# Patient Record
Sex: Male | Born: 1965 | Race: White | Hispanic: No | Marital: Married | Smoking: Current every day smoker
Health system: Southern US, Community
[De-identification: ages and names within clinical notes are randomized; demographics above are authoritative.]

## PROBLEM LIST (undated history)

## (undated) DIAGNOSIS — I509 Heart failure, unspecified: Secondary | ICD-10-CM

## (undated) DIAGNOSIS — Z87891 Personal history of nicotine dependence: Secondary | ICD-10-CM

## (undated) DIAGNOSIS — E785 Hyperlipidemia, unspecified: Secondary | ICD-10-CM

## (undated) DIAGNOSIS — F419 Anxiety disorder, unspecified: Secondary | ICD-10-CM

## (undated) DIAGNOSIS — I10 Essential (primary) hypertension: Secondary | ICD-10-CM

## (undated) DIAGNOSIS — F1910 Other psychoactive substance abuse, uncomplicated: Secondary | ICD-10-CM

## (undated) DIAGNOSIS — I2119 ST elevation (STEMI) myocardial infarction involving other coronary artery of inferior wall: Secondary | ICD-10-CM

## (undated) DIAGNOSIS — J45909 Unspecified asthma, uncomplicated: Secondary | ICD-10-CM

## (undated) DIAGNOSIS — I2510 Atherosclerotic heart disease of native coronary artery without angina pectoris: Secondary | ICD-10-CM

## (undated) DIAGNOSIS — I5020 Unspecified systolic (congestive) heart failure: Secondary | ICD-10-CM

## (undated) HISTORY — PX: CARDIAC CATHETERIZATION: SHX172

## (undated) HISTORY — PX: SHOULDER SURGERY: SHX246

---

## 2004-06-27 ENCOUNTER — Emergency Department: Payer: Self-pay | Admitting: Unknown Physician Specialty

## 2005-02-18 ENCOUNTER — Ambulatory Visit: Payer: Self-pay | Admitting: Unknown Physician Specialty

## 2005-03-31 ENCOUNTER — Ambulatory Visit: Payer: Self-pay | Admitting: Pain Medicine

## 2005-04-30 ENCOUNTER — Ambulatory Visit: Payer: Self-pay | Admitting: Pain Medicine

## 2005-07-07 ENCOUNTER — Ambulatory Visit: Payer: Self-pay | Admitting: Psychiatry

## 2006-09-28 ENCOUNTER — Ambulatory Visit: Payer: Self-pay | Admitting: Emergency Medicine

## 2006-10-19 ENCOUNTER — Ambulatory Visit: Payer: Self-pay | Admitting: Emergency Medicine

## 2007-03-16 ENCOUNTER — Ambulatory Visit: Payer: Self-pay | Admitting: Emergency Medicine

## 2007-07-08 ENCOUNTER — Ambulatory Visit: Payer: Self-pay | Admitting: Emergency Medicine

## 2007-07-08 ENCOUNTER — Ambulatory Visit: Payer: Self-pay | Admitting: Family Medicine

## 2007-09-12 ENCOUNTER — Ambulatory Visit: Payer: Self-pay | Admitting: Internal Medicine

## 2008-02-01 ENCOUNTER — Ambulatory Visit: Payer: Self-pay | Admitting: Family Medicine

## 2008-05-07 ENCOUNTER — Emergency Department (HOSPITAL_BASED_OUTPATIENT_CLINIC_OR_DEPARTMENT_OTHER): Admission: EM | Admit: 2008-05-07 | Discharge: 2008-05-07 | Payer: Self-pay | Admitting: Emergency Medicine

## 2008-05-07 ENCOUNTER — Ambulatory Visit: Payer: Self-pay | Admitting: Diagnostic Radiology

## 2009-07-03 ENCOUNTER — Ambulatory Visit: Payer: Self-pay | Admitting: Family Medicine

## 2009-09-04 ENCOUNTER — Emergency Department: Payer: Self-pay | Admitting: Emergency Medicine

## 2010-03-03 ENCOUNTER — Ambulatory Visit: Payer: Self-pay | Admitting: Internal Medicine

## 2010-03-05 ENCOUNTER — Emergency Department: Payer: Self-pay | Admitting: Unknown Physician Specialty

## 2010-06-08 ENCOUNTER — Ambulatory Visit: Payer: Self-pay | Admitting: Internal Medicine

## 2010-06-12 ENCOUNTER — Ambulatory Visit: Payer: Self-pay | Admitting: Internal Medicine

## 2010-10-26 ENCOUNTER — Ambulatory Visit: Payer: Self-pay | Admitting: Family Medicine

## 2010-10-29 ENCOUNTER — Emergency Department: Payer: Self-pay | Admitting: Emergency Medicine

## 2011-08-03 ENCOUNTER — Ambulatory Visit: Payer: Self-pay

## 2011-11-01 ENCOUNTER — Emergency Department: Payer: Self-pay | Admitting: Emergency Medicine

## 2011-12-14 ENCOUNTER — Ambulatory Visit: Payer: Self-pay | Admitting: Internal Medicine

## 2012-02-02 ENCOUNTER — Ambulatory Visit: Payer: Self-pay | Admitting: Internal Medicine

## 2012-04-05 ENCOUNTER — Ambulatory Visit: Payer: Self-pay | Admitting: Internal Medicine

## 2012-04-22 ENCOUNTER — Ambulatory Visit: Payer: Self-pay

## 2015-07-11 ENCOUNTER — Encounter: Payer: Self-pay | Admitting: Medical Oncology

## 2015-07-11 ENCOUNTER — Emergency Department
Admission: EM | Admit: 2015-07-11 | Discharge: 2015-07-11 | Disposition: A | Discharge status: Home / Self Care | Payer: Self-pay | Attending: Emergency Medicine | Admitting: Emergency Medicine

## 2015-07-11 DIAGNOSIS — B9689 Other specified bacterial agents as the cause of diseases classified elsewhere: Secondary | ICD-10-CM

## 2015-07-11 DIAGNOSIS — F17200 Nicotine dependence, unspecified, uncomplicated: Secondary | ICD-10-CM | POA: Insufficient documentation

## 2015-07-11 DIAGNOSIS — J019 Acute sinusitis, unspecified: Secondary | ICD-10-CM | POA: Insufficient documentation

## 2015-07-11 MED ORDER — FLUTICASONE PROPIONATE 50 MCG/ACT NA SUSP: 2.0000 | Freq: Every day | NASAL | Status: DC

## 2015-07-11 MED ORDER — AMOXICILLIN 875 MG PO TABS: 875.0000 mg | ORAL_TABLET | Freq: Two times a day (BID) | ORAL | Status: DC

## 2015-07-11 MED ORDER — PSEUDOEPH-BROMPHEN-DM 30-2-10 MG/5ML PO SYRP: 10.0000 mL | ORAL_SOLUTION | Freq: Four times a day (QID) | ORAL | Status: DC | PRN

## 2015-07-11 MED ORDER — ALBUTEROL SULFATE HFA 108 (90 BASE) MCG/ACT IN AERS: 1.0000 | INHALATION_SPRAY | Freq: Four times a day (QID) | RESPIRATORY_TRACT | Status: DC | PRN

## 2015-07-11 NOTE — ED Provider Notes (Signed)
[facility] Emergency Department Provider Note  ____________________________________________  Time seen: Approximately 3:53 PM  I have reviewed the triage vital signs and the nursing notes.   HISTORY  Chief Complaint Facial Pain and Nasal Congestion    HPI Matthew Tapia is a 50 y.o. male, NAD, presents to the emergency department with 2 week history of sinus pressure, sore throat, nasal congestion, headache, cough with chest congestion. Notes he has chronic sinus issues along with allergies and has been trying to control with over-the-counter medications. Has had no fever, chills, body aches. Denies chest pain, back pain, abdominal pain, nausea, vomiting.   History reviewed. No pertinent past medical history.  There are no active problems to display for this patient.   Past Surgical History  Procedure Laterality Date  . Shoulder surgery      Current Outpatient Rx  Name  Route  Sig  Dispense  Refill  . albuterol (PROVENTIL HFA;VENTOLIN HFA) 108 (90 Base) MCG/ACT inhaler   Inhalation   Inhale 1-2 puffs into the lungs every 6 (six) hours as needed for wheezing or shortness of breath.   1 Inhaler   0   . amoxicillin (AMOXIL) 875 MG tablet   Oral   Take 1 tablet (875 mg total) by mouth 2 (two) times daily.   20 tablet   0   . brompheniramine-pseudoephedrine-DM 30-2-10 MG/5ML syrup   Oral   Take 10 mLs by mouth 4 (four) times daily as needed.   200 mL   0   . fluticasone (FLONASE) 50 MCG/ACT nasal spray   Each Nare   Place 2 sprays into both nostrils daily.   16 g   0     Allergies Review of patient's allergies indicates no known allergies.  History reviewed. No pertinent family history.  Social History Social History  Substance Use Topics  . Smoking status: Current Every Day Smoker  . Smokeless tobacco: None  . Alcohol Use: No     Review of Systems  Constitutional: No fever/chills Eyes: No visual changes. No discharge ENT:  Positive sore throat, ear pressure, nasal congestion, nasal drainage, sinus pressure. Cardiovascular: No chest pain. Respiratory: Positive cough with thick mucus. No shortness of breath. No wheezing.  Gastrointestinal: No abdominal pain.  No nausea, vomiting.  Musculoskeletal: Negative for myalgias.  Skin: Negative for rash. Neurological: Negative for headaches, focal weakness or numbness. 10-point ROS otherwise negative.  ____________________________________________   PHYSICAL EXAM:  VITAL SIGNS: ED Triage Vitals  Enc Vitals Group     BP 07/11/15 1431 147/90 mmHg     Pulse Rate 07/11/15 1431 86     Resp 07/11/15 1431 18     Temp 07/11/15 1431 98.2 F (36.8 C)     Temp Source 07/11/15 1431 Oral     SpO2 07/11/15 1431 97 %     Weight 07/11/15 1431 210 lb (95.255 kg)     Height 07/11/15 1431  (1.676 m)     Head Cir --      Peak Flow --      Pain Score --      Pain Loc --      Pain Edu? --      Excl. in GC? --     Constitutional: Alert and oriented. Well appearing and in no acute distress. Eyes: Conjunctivae are normal. PERRL. EOMI without pain.  Head: Atraumatic. ENT:      Ears: Bilateral TMs with mild injection and moderate serous effusion without bulging. Light  reflex normal. Bilateral external ear canals without erythema, swelling, discharge.      Nose: Moderate to severe congestion with moderate clear rhinnorhea.      Mouth/Throat: Mucous membranes are moist. Mild injection of the pharynx without swelling or exudate Neck: No stridor. Supple with full range of motion. Hematological/Lymphatic/Immunilogical: No cervical lymphadenopathy. Cardiovascular: Normal rate, regular rhythm. Normal S1 and S2.   Respiratory: Normal respiratory effort without tachypnea or retractions. Lungs with mild wheeze the right middle lobe without rhonchi or rales. All other lung fields with clear breath sounds without rhonchi, rales, wheeze.Marland Kitchen Neurologic:  Normal speech and language. No  gross focal neurologic deficits are appreciated.  Skin:  Skin is warm, dry and intact. No rash noted. Psychiatric: Mood and affect are normal. Speech and behavior are normal. Patient exhibits appropriate insight and judgement.   ____________________________________________   LABS  None  ____________________________________________  EKG  None ____________________________________________  RADIOLOGY  none ____________________________________________    PROCEDURES  Procedure(s) performed: None   Medications - No data to display   ____________________________________________   INITIAL IMPRESSION / ASSESSMENT AND PLAN / ED COURSE  Patient's diagnosis is consistent with acute bacterial sinusitis. Patient will be discharged home with prescriptions for amoxicillin to take twice daily for 10 days, Bromfed-DM cough syrup to take 10 L by mouth every 4-6 hours as needed for cough and congestion, Flonase to take 2 sprays into both nostrils daily, albuterol inhaler to take 1-2 puffs every 4-6 hours as needed for cough and shortness of breath. Patient is to follow up with [facility] if symptoms persist past this treatment course. Patient is given ED precautions to return to the ED for any worsening or new symptoms.   ____________________________________________  FINAL CLINICAL IMPRESSION(S) / ED DIAGNOSES  Final diagnoses:  Acute bacterial sinusitis      NEW MEDICATIONS STARTED DURING THIS VISIT:  New Prescriptions   ALBUTEROL (PROVENTIL HFA;VENTOLIN HFA) 108 (90 BASE) MCG/ACT INHALER    Inhale 1-2 puffs into the lungs every 6 (six) hours as needed for wheezing or shortness of breath.   AMOXICILLIN (AMOXIL) 875 MG TABLET    Take 1 tablet (875 mg total) by mouth 2 (two) times daily.   BROMPHENIRAMINE-PSEUDOEPHEDRINE-DM 30-2-10 MG/5ML SYRUP    Take 10 mLs by mouth 4 (four) times daily as needed.   FLUTICASONE (FLONASE) 50 MCG/ACT NASAL SPRAY    Place 2 sprays into  both nostrils daily.         Hope Pigeon, PA-C 07/11/15 1607  Phineas Semen, MD 07/11/15 [phone]

## 2015-07-11 NOTE — Discharge Instructions (Signed)
Sinus Rinse WHAT IS A SINUS RINSE? A sinus rinse is a simple home treatment that is used to rinse your sinuses with a sterile mixture of salt and water (saline solution). Sinuses are air-filled spaces in your skull behind the bones of your face and forehead that open into your nasal cavity. You will use the following:  Saline solution.  Neti pot or spray bottle. This releases the saline solution into your nose and through your sinuses. Neti pots and spray bottles can be purchased at your local pharmacy, a health food store, or online. WHEN WOULD I DO A SINUS RINSE? A sinus rinse can help to clear mucus, dirt, dust, or pollen from the nasal cavity. You may do a sinus rinse when you have a cold, a virus, nasal allergy symptoms, a sinus infection, or stuffiness in the nose or sinuses. If you are considering a sinus rinse:  Ask your child's health care provider before performing a sinus rinse on your child.  Do not do a sinus rinse if you have had ear or nasal surgery, ear infection, or blocked ears. HOW DO I DO A SINUS RINSE?  Wash your hands.  Disinfect your device according to the directions provided and then dry it.  Use the solution that comes with your device or one that is sold separately in stores. Follow the mixing directions on the package.  Fill your device with the amount of saline solution as directed by the device instructions.  Stand over a sink and tilt your head sideways over the sink.  Place the spout of the device in your upper nostril (the one closer to the ceiling).  Gently pour or squeeze the saline solution into the nasal cavity. The liquid should drain to the lower nostril if you are not overly congested.  Gently blow your nose. Blowing too hard may cause ear pain.  Repeat in the other nostril.  Clean and rinse your device with clean water and then air-dry it. ARE THERE RISKS OF A SINUS RINSE?  Sinus rinse is generally very safe and effective. However, there  are a few risks, which include:   A burning sensation in the sinuses. This may happen if you do not make the saline solution as directed. Make sure to follow all directions when making the saline solution.  Infection from contaminated water. This is rare, but possible.  Nasal irritation.   This information is not intended to replace advice given to you by your health care provider. Make sure you discuss any questions you have with your health care provider.   Document Released: 12/06/2013 Document Reviewed: 12/06/2013 Elsevier Interactive Patient Education 2016 [employer]  Sinusitis, Adult Sinusitis is redness, soreness, and inflammation of the paranasal sinuses. Paranasal sinuses are air pockets within the bones of your face. They are located beneath your eyes, in the middle of your forehead, and above your eyes. In healthy paranasal sinuses, mucus is able to drain out, and air is able to circulate through them by way of your nose. However, when your paranasal sinuses are inflamed, mucus and air can become trapped. This can allow bacteria and other germs to grow and cause infection. Sinusitis can develop quickly and last only a short time (acute) or continue over a long period (chronic). Sinusitis that lasts for more than 12 weeks is considered chronic. CAUSES Causes of sinusitis include:  Allergies.  Structural abnormalities, such as displacement of the cartilage that separates your nostrils (deviated septum), which can decrease the air flow   through your nose and sinuses and affect sinus drainage.  Functional abnormalities, such as when the small hairs (cilia) that line your sinuses and help remove mucus do not work properly or are not present. SIGNS AND SYMPTOMS Symptoms of acute and chronic sinusitis are the same. The primary symptoms are pain and pressure around the affected sinuses. Other symptoms include:  Upper toothache.  Earache.  Headache.  Bad breath.  Decreased  sense of smell and taste.  A cough, which worsens when you are lying flat.  Fatigue.  Fever.  Thick drainage from your nose, which often is green and may contain pus (purulent).  Swelling and warmth over the affected sinuses. DIAGNOSIS Your health care provider will perform a physical exam. During your exam, your health care provider may perform any of the following to help determine if you have acute sinusitis or chronic sinusitis:  Look in your nose for signs of abnormal growths in your nostrils (nasal polyps).  Tap over the affected sinus to check for signs of infection.  View the inside of your sinuses using an imaging device that has a light attached (endoscope). If your health care provider suspects that you have chronic sinusitis, one or more of the following tests may be recommended:  Allergy tests.  Nasal culture. A sample of mucus is taken from your nose, sent to a lab, and screened for bacteria.  Nasal cytology. A sample of mucus is taken from your nose and examined by your health care provider to determine if your sinusitis is related to an allergy. TREATMENT Most cases of acute sinusitis are related to a viral infection and will resolve on their own within 10 days. Sometimes, medicines are prescribed to help relieve symptoms of both acute and chronic sinusitis. These may include pain medicines, decongestants, nasal steroid sprays, or saline sprays. However, for sinusitis related to a bacterial infection, your health care provider will prescribe antibiotic medicines. These are medicines that will help kill the bacteria causing the infection. Rarely, sinusitis is caused by a fungal infection. In these cases, your health care provider will prescribe antifungal medicine. For some cases of chronic sinusitis, surgery is needed. Generally, these are cases in which sinusitis recurs more than 3 times per year, despite other treatments. HOME CARE INSTRUCTIONS  Drink plenty of  water. Water helps thin the mucus so your sinuses can drain more easily.  Use a humidifier.  Inhale steam 3-4 times a day (for example, sit in the bathroom with the shower running).  Apply a warm, moist washcloth to your face 3-4 times a day, or as directed by your health care provider.  Use saline nasal sprays to help moisten and clean your sinuses.  Take medicines only as directed by your health care provider.  If you were prescribed either an antibiotic or antifungal medicine, finish it all even if you start to feel better. SEEK IMMEDIATE MEDICAL CARE IF:  You have increasing pain or severe headaches.  You have nausea, vomiting, or drowsiness.  You have swelling around your face.  You have vision problems.  You have a stiff neck.  You have difficulty breathing.   This information is not intended to replace advice given to you by your health care provider. Make sure you discuss any questions you have with your health care provider.   Document Released: 05/11/2005 Document Revised: 06/01/2014 Document Reviewed: 05/26/2011 Elsevier Interactive Patient Education 2016 Elsevier Inc.  

## 2015-07-11 NOTE — ED Notes (Signed)
Pt reports sinus pressure, nasal congestion and cough x 2 weeks.

## 2015-07-11 NOTE — ED Notes (Signed)
States he developed sinus pressure and cough about 2 weeks ago  States he was using OTC meds with relief initially  But now states he thinks he is worse now.Marland Kitchen afebrile

## 2018-01-16 ENCOUNTER — Other Ambulatory Visit: Payer: Self-pay

## 2018-01-16 ENCOUNTER — Encounter: Payer: Self-pay | Admitting: Emergency Medicine

## 2018-01-16 ENCOUNTER — Emergency Department
Admission: EM | Admit: 2018-01-16 | Discharge: 2018-01-16 | Disposition: A | Discharge status: Home / Self Care | Payer: Self-pay | Attending: Emergency Medicine | Admitting: Emergency Medicine

## 2018-01-16 DIAGNOSIS — Y92321 Football field as the place of occurrence of the external cause: Secondary | ICD-10-CM | POA: Insufficient documentation

## 2018-01-16 DIAGNOSIS — Z79899 Other long term (current) drug therapy: Secondary | ICD-10-CM | POA: Insufficient documentation

## 2018-01-16 DIAGNOSIS — S3991XA Unspecified injury of abdomen, initial encounter: Secondary | ICD-10-CM | POA: Insufficient documentation

## 2018-01-16 DIAGNOSIS — Z87891 Personal history of nicotine dependence: Secondary | ICD-10-CM | POA: Insufficient documentation

## 2018-01-16 DIAGNOSIS — Y998 Other external cause status: Secondary | ICD-10-CM | POA: Insufficient documentation

## 2018-01-16 DIAGNOSIS — W501XXA Accidental kick by another person, initial encounter: Secondary | ICD-10-CM | POA: Insufficient documentation

## 2018-01-16 DIAGNOSIS — Y9361 Activity, american tackle football: Secondary | ICD-10-CM | POA: Insufficient documentation

## 2018-01-16 LAB — COMPREHENSIVE METABOLIC PANEL
ALT: 20 U/L (ref 0–44)
AST: 34 U/L (ref 15–41)
Albumin: 4.3 g/dL (ref 3.5–5.0)
Alkaline Phosphatase: 51 U/L (ref 38–126)
Anion gap: 8 (ref 5–15)
BUN: 7 mg/dL (ref 6–20)
CO2: 24 mmol/L (ref 22–32)
Calcium: 9.2 mg/dL (ref 8.9–10.3)
Chloride: 107 mmol/L (ref 98–111)
Creatinine, Ser: 0.81 mg/dL (ref 0.61–1.24)
GFR calc Af Amer: >60 mL/min (ref >60)
GFR calc non Af Amer: >60 mL/min (ref >60)
Glucose, Bld: 130 mg/dL — ABNORMAL HIGH (ref 70–99)
Potassium: 3.4 mmol/L — ABNORMAL LOW (ref 3.5–5.1)
Sodium: 139 mmol/L (ref 135–145)
Total Bilirubin: 0.5 mg/dL (ref 0.3–1.2)
Total Protein: 7.9 g/dL (ref 6.5–8.1)

## 2018-01-16 LAB — URINALYSIS, COMPLETE (UACMP) WITH MICROSCOPIC
Bacteria, UA: NONE SEEN
Bilirubin Urine: NEGATIVE
Glucose, UA: NEGATIVE mg/dL
Hgb urine dipstick: NEGATIVE
Ketones, ur: 5 mg/dL — AB
Leukocytes, UA: NEGATIVE
Nitrite: NEGATIVE
Protein, ur: 30 mg/dL — AB
Specific Gravity, Urine: 1.036 — ABNORMAL HIGH (ref 1.005–1.030)
Squamous Epithelial / LPF: NONE SEEN (ref 0–5)
pH: 5 (ref 5.0–8.0)

## 2018-01-16 LAB — LIPASE, BLOOD: Lipase: 29 U/L (ref 11–51)

## 2018-01-16 LAB — CBC
HCT: 45.3 % (ref 40.0–52.0)
Hemoglobin: 15.8 g/dL (ref 13.0–18.0)
MCH: 34.3 pg — ABNORMAL HIGH (ref 26.0–34.0)
MCHC: 34.8 g/dL (ref 32.0–36.0)
MCV: 98.8 fL (ref 80.0–100.0)
Platelets: 242 10*3/uL (ref 150–440)
RBC: 4.59 MIL/uL (ref 4.40–5.90)
RDW: 13.4 % (ref 11.5–14.5)
WBC: 7.6 10*3/uL (ref 3.8–10.6)

## 2018-01-16 MED ORDER — GI COCKTAIL ~~LOC~~: 30.0000 mL | Freq: Once | ORAL | Status: DC

## 2018-01-16 MED ORDER — GI COCKTAIL ~~LOC~~
30.0000 mL | Freq: Once | ORAL | Status: AC
  Administered 2018-01-16: 30 mL via ORAL

## 2018-01-16 MED ORDER — GI COCKTAIL ~~LOC~~
ORAL | Status: AC
  Filled 2018-01-16: qty 30

## 2018-01-16 MED ORDER — IPRATROPIUM-ALBUTEROL 0.5-2.5 (3) MG/3ML IN SOLN
3.0000 mL | Freq: Once | RESPIRATORY_TRACT | Status: AC
  Administered 2018-01-16: 3 mL via RESPIRATORY_TRACT
  Filled 2018-01-16: qty 3

## 2018-01-16 NOTE — ED Triage Notes (Signed)
Pt presents to ED c/o mid abd pain after being kicked while playing football yesterday. Pt states pain worsened after eating today.

## 2018-01-16 NOTE — ED Provider Notes (Signed)
[facility] Emergency Department Provider Note   ____________________________________________    I have reviewed the triage vital signs and the nursing notes.   HISTORY  Chief Complaint Abdominal Injury     HPI Matthew Tapia is a 52 y.o. male who presents with complaints of abdominal pain.  Patient reports he was playing football with family yesterday and went to block a kick and was kicked in his abdomen in the epigastrium.  He reports some pain after the event but it gradually improved.  He describes mild nausea since the event.  Today he ate some green beans and felt that they were burning on the way down.  His wife became concerned and told him he needs to come to the emergency department.  Has not taken anything for this.  Denies shortness of breath.  Reports chronic cough  History reviewed. No pertinent past medical history.  There are no active problems to display for this patient.   Past Surgical History:  Procedure Laterality Date  . SHOULDER SURGERY      Prior to Admission medications   Medication Sig Start Date End Date Taking? Authorizing Provider  albuterol (PROVENTIL HFA;VENTOLIN HFA) 108 (90 Base) MCG/ACT inhaler Inhale 1-2 puffs into the lungs every 6 (six) hours as needed for wheezing or shortness of breath. 07/11/15   Hagler, Jami L, PA-C  amoxicillin (AMOXIL) 875 MG tablet Take 1 tablet (875 mg total) by mouth 2 (two) times daily. 07/11/15   Hagler, Jami L, PA-C  brompheniramine-pseudoephedrine-DM 30-2-10 MG/5ML syrup Take 10 mLs by mouth 4 (four) times daily as needed. 07/11/15   Hagler, Jami L, PA-C  fluticasone (FLONASE) 50 MCG/ACT nasal spray Place 2 sprays into both nostrils daily. 07/11/15   Hagler, Jami L, PA-C     Allergies Patient has no known allergies.  History reviewed. No pertinent family history.  Social History Social History   Tobacco Use  . Smoking status: Former Smoker    Types: Cigarettes  . Smokeless  tobacco: Never Used  Substance Use Topics  . Alcohol use: No  . Drug use: Not on file    Review of Systems  Constitutional: No fever/chills Eyes: No visual changes.  ENT: No sore throat. Cardiovascular: Denies chest pain. Respiratory: Denies shortness of breath.  Chronic cough Gastrointestinal: As above Genitourinary: Negative for dysuria. Musculoskeletal: Negative for back pain. Skin: Negative for rash. Neurological: Negative for headaches    ____________________________________________   PHYSICAL EXAM:  VITAL SIGNS: ED Triage Vitals  Enc Vitals Group     BP 01/16/18 1825 (!) 148/94     Pulse Rate 01/16/18 1825 86     Resp 01/16/18 1825 18     Temp 01/16/18 1825 98.3 F (36.8 C)     Temp Source 01/16/18 1825 Oral     SpO2 01/16/18 1825 96 %     Weight 01/16/18 1828 95.3 kg (210 lb)     Height 01/16/18 1828 1.6 m (5\' 3" )     Head Circumference --      Peak Flow --      Pain Score 01/16/18 1826 7     Pain Loc --      Pain Edu? --      Excl. in GC? --     Constitutional: Alert and oriented. No acute distress. Pleasant and interactive Eyes: Conjunctivae are normal.   Nose: No congestion/rhinnorhea. Mouth/Throat: Mucous membranes are moist.    Cardiovascular: Normal rate, regular rhythm. Grossly normal heart sounds.  Good peripheral circulation. Respiratory: Normal respiratory effort.  No retractions.  Gastrointestinal: Soft and nontender. No distention.  Reassuring exam  Musculoskeletal: No lower extremity tenderness nor edema.  Warm and well perfused Neurologic:  Normal speech and language. No gross focal neurologic deficits are appreciated.  Skin:  Skin is warm, dry and intact. No rash noted. Psychiatric: Mood and affect are normal. Speech and behavior are normal.  ____________________________________________   LABS (all labs ordered are listed, but only abnormal results are displayed)  Labs Reviewed  COMPREHENSIVE METABOLIC PANEL - Abnormal; Notable  for the following components:      Result Value   Potassium 3.4 (*)    Glucose, Bld 130 (*)    All other components within normal limits  CBC - Abnormal; Notable for the following components:   MCH 34.3 (*)    All other components within normal limits  URINALYSIS, COMPLETE (UACMP) WITH MICROSCOPIC - Abnormal; Notable for the following components:   Color, Urine AMBER (*)    APPearance CLEAR (*)    Specific Gravity, Urine 1.036 (*)    Ketones, ur 5 (*)    Protein, ur 30 (*)    All other components within normal limits  LIPASE, BLOOD   ____________________________________________  EKG  None ____________________________________________  RADIOLOGY  None ____________________________________________   PROCEDURES  Procedure(s) performed: No  Procedures   Critical Care performed: No ____________________________________________   INITIAL IMPRESSION / ASSESSMENT AND PLAN / ED COURSE  Pertinent labs & imaging results that were available during my care of the patient were reviewed by me and considered in my medical decision making (see chart for details).  The patient well-appearing in no acute distress.  Vital signs are reassuring.  Abdominal exam is benign.  He may have some mild gastritis from the injury.  We will give GI cocktail, check labs, give DuoNeb for his cough and reevaluate  On reexam patient reports he feels significantly better, although he attributes this to sitting in a more comfortable fashion.  Lab work is unremarkable.  No elevation in LFTs or white blood cell count to suggest significant intra-abdominal injury.  Recommend supportive care, return precautions discussed    ____________________________________________   FINAL CLINICAL IMPRESSION(S) / ED DIAGNOSES  Final diagnoses:  Abdominal injury, initial encounter        Note:  This document was prepared using Dragon voice recognition software and may include unintentional dictation errors.      Jene Every, MD 01/16/18 2028

## 2019-04-21 ENCOUNTER — Other Ambulatory Visit: Payer: Self-pay

## 2019-04-21 ENCOUNTER — Emergency Department: Payer: Self-pay

## 2019-04-21 ENCOUNTER — Emergency Department
Admission: EM | Admit: 2019-04-21 | Discharge: 2019-04-21 | Disposition: A | Discharge status: Home / Self Care | Payer: Self-pay | Attending: Emergency Medicine | Admitting: Emergency Medicine

## 2019-04-21 DIAGNOSIS — F17210 Nicotine dependence, cigarettes, uncomplicated: Secondary | ICD-10-CM | POA: Insufficient documentation

## 2019-04-21 DIAGNOSIS — R0602 Shortness of breath: Secondary | ICD-10-CM | POA: Insufficient documentation

## 2019-04-21 DIAGNOSIS — Z79899 Other long term (current) drug therapy: Secondary | ICD-10-CM | POA: Insufficient documentation

## 2019-04-21 DIAGNOSIS — Z20828 Contact with and (suspected) exposure to other viral communicable diseases: Secondary | ICD-10-CM | POA: Insufficient documentation

## 2019-04-21 LAB — COMPREHENSIVE METABOLIC PANEL
ALT: 21 U/L (ref 0–44)
AST: 22 U/L (ref 15–41)
Albumin: 4.2 g/dL (ref 3.5–5.0)
Alkaline Phosphatase: 50 U/L (ref 38–126)
Anion gap: 9 (ref 5–15)
BUN: 18 mg/dL (ref 6–20)
CO2: 26 mmol/L (ref 22–32)
Calcium: 9.4 mg/dL (ref 8.9–10.3)
Chloride: 102 mmol/L (ref 98–111)
Creatinine, Ser: 0.74 mg/dL (ref 0.61–1.24)
GFR calc Af Amer: >60 mL/min (ref >60)
GFR calc non Af Amer: >60 mL/min (ref >60)
Glucose, Bld: 117 mg/dL — ABNORMAL HIGH (ref 70–99)
Potassium: 4 mmol/L (ref 3.5–5.1)
Sodium: 137 mmol/L (ref 135–145)
Total Bilirubin: 0.6 mg/dL (ref 0.3–1.2)
Total Protein: 7.6 g/dL (ref 6.5–8.1)

## 2019-04-21 LAB — CBC WITH DIFFERENTIAL/PLATELET
Abs Immature Granulocytes: 0.03 10*3/uL (ref 0.00–0.07)
Basophils Absolute: 0.1 10*3/uL (ref 0.0–0.1)
Basophils Relative: 1 %
Eosinophils Absolute: 0.2 10*3/uL (ref 0.0–0.5)
Eosinophils Relative: 2 %
HCT: 43.4 % (ref 39.0–52.0)
Hemoglobin: 14.5 g/dL (ref 13.0–17.0)
Immature Granulocytes: 0 %
Lymphocytes Relative: 34 %
Lymphs Abs: 3.4 10*3/uL (ref 0.7–4.0)
MCH: 33 pg (ref 26.0–34.0)
MCHC: 33.4 g/dL (ref 30.0–36.0)
MCV: 98.9 fL (ref 80.0–100.0)
Monocytes Absolute: 0.7 10*3/uL (ref 0.1–1.0)
Monocytes Relative: 7 %
Neutro Abs: 5.6 10*3/uL (ref 1.7–7.7)
Neutrophils Relative %: 56 %
Platelets: 265 10*3/uL (ref 150–400)
RBC: 4.39 MIL/uL (ref 4.22–5.81)
RDW: 12.4 % (ref 11.5–15.5)
WBC: 10 10*3/uL (ref 4.0–10.5)
nRBC: 0 % (ref 0.0–0.2)

## 2019-04-21 LAB — TROPONIN I (HIGH SENSITIVITY): Troponin I (High Sensitivity): 3 ng/L (ref <18)

## 2019-04-21 MED ORDER — ALBUTEROL SULFATE HFA 108 (90 BASE) MCG/ACT IN AERS
2.0000 | INHALATION_SPRAY | Freq: Once | RESPIRATORY_TRACT | Status: AC
  Administered 2019-04-21: 2 via RESPIRATORY_TRACT
  Filled 2019-04-21: qty 6.7

## 2019-04-21 MED ORDER — METHYLPREDNISOLONE SODIUM SUCC 125 MG IJ SOLR
125.0000 mg | Freq: Once | INTRAMUSCULAR | Status: AC
  Administered 2019-04-21: 125 mg via INTRAVENOUS
  Filled 2019-04-21: qty 2

## 2019-04-21 MED ORDER — IPRATROPIUM-ALBUTEROL 0.5-2.5 (3) MG/3ML IN SOLN
3.0000 mL | Freq: Once | RESPIRATORY_TRACT | Status: AC
  Administered 2019-04-21: 3 mL via RESPIRATORY_TRACT
  Filled 2019-04-21: qty 3

## 2019-04-21 MED ORDER — PREDNISONE 10 MG PO TABS: 10.0000 mg | ORAL_TABLET | Freq: Every day | ORAL | 0 refills | Status: DC

## 2019-04-21 MED ORDER — ALBUTEROL SULFATE HFA 108 (90 BASE) MCG/ACT IN AERS: 2.0000 | INHALATION_SPRAY | Freq: Four times a day (QID) | RESPIRATORY_TRACT | 1 refills | Status: DC | PRN

## 2019-04-21 NOTE — ED Provider Notes (Signed)
Patient received in signout from Dr. Alfred Levins.  Does have wheezing on exam not having any chest pain.  Had significant improvement after nebulizer.  Does have remote history of COPD.  Is not requiring supplemental oxygen.  No fevers.  Not consistent with ACS CHF or PE.  Most consistent with bronchitis/mild COPD exacerbation.  Patient appears clinically stable and appropriate for outpatient management at this time.  Have discussed with the patient and available family all diagnostics and treatments performed thus far and all questions were answered to the best of my ability. The patient demonstrates understanding and agreement with plan.    Merlyn Lot, MD 04/21/19 [phone]

## 2019-04-21 NOTE — ED Notes (Signed)
NAD with ambulation. O2 sats remained between 96%-97% during ambulation.

## 2019-04-21 NOTE — ED Provider Notes (Signed)
[facility] Emergency Department Provider Note  ____________________________________________  Time seen: Approximately 6:39 AM  I have reviewed the triage vital signs and the nursing notes.   HISTORY  Chief Complaint Shortness of Breath   HPI Matthew Tapia is a 53 y.o. male who presents for evaluation of shortness of breath.  Patient reports that he has been a smoker for several years.  Was told by his doctor several years ago that he had early stages of COPD.  He reports that he was never fully tested for it or received a formal diagnosis.  He has a chronic cough which has been worse over the last few days.  This morning he woke up feeling short of breath.  Shortness of breath is worse with ambulation.  No fever, no chest pain, no loss of taste or smell, no body aches, no sore throat.  No personal or family history of blood clots, recent travel immobilization, leg pain or swelling, hemoptysis, or exogenous hormones.   No personal or family history of heart attacks.  Patient reports that he has not seen a doctor in several years.  PMH None - reviewed  Past Surgical History:  Procedure Laterality Date  . SHOULDER SURGERY      Prior to Admission medications   Medication Sig Start Date End Date Taking? Authorizing Provider  albuterol (PROVENTIL HFA;VENTOLIN HFA) 108 (90 Base) MCG/ACT inhaler Inhale 1-2 puffs into the lungs every 6 (six) hours as needed for wheezing or shortness of breath. 07/11/15   Hagler, Jami L, PA-C  albuterol (VENTOLIN HFA) 108 (90 Base) MCG/ACT inhaler Inhale 2 puffs into the lungs every 6 (six) hours as needed for wheezing or shortness of breath. 04/21/19   Willy Eddy, MD  amoxicillin (AMOXIL) 875 MG tablet Take 1 tablet (875 mg total) by mouth 2 (two) times daily. 07/11/15   Hagler, Jami L, PA-C  brompheniramine-pseudoephedrine-DM 30-2-10 MG/5ML syrup Take 10 mLs by mouth 4 (four) times daily as needed. 07/11/15   Hagler, Jami L,  PA-C  fluticasone (FLONASE) 50 MCG/ACT nasal spray Place 2 sprays into both nostrils daily. 07/11/15   Hagler, Jami L, PA-C  predniSONE (DELTASONE) 10 MG tablet Take 1 tablet (10 mg total) by mouth daily. Day 1-2: Take 50 mg  ( 5 pills) Day 3-4 : Take 40 mg (4pills) Day 5-6: Take 30 mg (3 pills) Day 7-8:  Take 20 mg (2 pills) Day 9:  Take 10mg  (1 pill) 04/21/19   04/23/19, MD    Allergies Patient has no known allergies.  History reviewed. No pertinent family history.  Social History Social History   Tobacco Use  . Smoking status: Current Every Day Smoker    Types: Cigarettes  . Smokeless tobacco: Never Used  Substance Use Topics  . Alcohol use: No  . Drug use: Not on file    Review of Systems  Constitutional: Negative for fever. Eyes: Negative for visual changes. ENT: Negative for sore throat. Neck: No neck pain  Cardiovascular: Negative for chest pain. Respiratory: + shortness of breath, cough Gastrointestinal: Negative for abdominal pain, vomiting or diarrhea. Genitourinary: Negative for dysuria. Musculoskeletal: Negative for back pain. Skin: Negative for rash. Neurological: Negative for headaches, weakness or numbness. Psych: No SI or HI  ____________________________________________   PHYSICAL EXAM:  VITAL SIGNS: ED Triage Vitals  Enc Vitals Group     BP 04/21/19 0634 129/83     Pulse Rate 04/21/19 0634 80     Resp 04/21/19 0634 18  Temp 04/21/19 0634 98.6 F (37 C)     Temp Source 04/21/19 0634 Oral     SpO2 04/21/19 0634 98 %     Weight 04/21/19 0633 198 lb (89.8 kg)     Height 04/21/19 0633 5\' 6"  (1.676 m)     Head Circumference --      Peak Flow --      Pain Score 04/21/19 0633 0     Pain Loc --      Pain Edu? --      Excl. in GC? --     Constitutional: Alert and oriented. Well appearing and in no apparent distress. HEENT:      Head: Normocephalic and atraumatic.         Eyes: Conjunctivae are normal. Sclera is non-icteric.        Mouth/Throat: Mucous membranes are moist.       Neck: Supple with no signs of meningismus. Cardiovascular: Regular rate and rhythm. No murmurs, gallops, or rubs. 2+ symmetrical distal pulses are present in all extremities. No JVD. Respiratory: Normal respiratory effort. Lungs are clear to auscultation bilaterally with good air movement and faint expiratory wheezes.  Gastrointestinal: Soft, non tender, and non distended with positive bowel sounds. No rebound or guarding. Musculoskeletal: Nontender with normal range of motion in all extremities. No edema, cyanosis, or erythema of extremities. Neurologic: Normal speech and language. Face is symmetric. Moving all extremities. No gross focal neurologic deficits are appreciated. Skin: Skin is warm, dry and intact. No rash noted. Psychiatric: Mood and affect are normal. Speech and behavior are normal.  ____________________________________________   LABS (all labs ordered are listed, but only abnormal results are displayed)  Labs Reviewed  COMPREHENSIVE METABOLIC PANEL - Abnormal; Notable for the following components:      Result Value   Glucose, Bld 117 (*)    All other components within normal limits  NOVEL CORONAVIRUS, NAA (HOSP ORDER, SEND-OUT TO REF LAB; TAT 18-24 HRS)  CBC WITH DIFFERENTIAL/PLATELET  TROPONIN I (HIGH SENSITIVITY)   ____________________________________________  EKG  ED ECG REPORT I, Nita Sicklearolina Khalise Billard, the attending physician, personally viewed and interpreted this ECG.  Normal sinus rhythm, rate of 74, normal intervals, normal axis, no ST elevations or depressions, anterior Q waves.  Unchanged from prior. ____________________________________________  RADIOLOGY  I have personally reviewed the images performed during this visit and I agree with the Radiologist's read.   CXR: PND ____________________________________________   PROCEDURES  Procedure(s) performed: None Procedures Critical Care performed:  None  ____________________________________________   INITIAL IMPRESSION / ASSESSMENT AND PLAN / ED COURSE  53 y.o. male who presents for evaluation of shortness of breath and worsening chronic cough.  Patient with normal work of breathing, normal sats, moving good air with faint expiratory wheezes bilaterally, looks euvolemic, no asymmetric leg swelling or pitting edema.  Patient is afebrile with no tachypnea, no hypoxia, no tachycardia.  Differential diagnosis including COPD versus bronchitis versus pulmonary edema versus pneumonia versus viral URI versus Covid.  Less likely PE with no chest pain, no hypoxia, no tachypnea, no tachycardia.  We will give duo nebs and Solu-Medrol.  Will get chest x-ray and labs.   ED COURSE: labs and CXR pending. Care transferred to Dr. Roxan Hockeyobinson at [facility].    As part of my medical decision making, I reviewed the following data within the electronic MEDICAL RECORD NUMBER Nursing notes reviewed and incorporated, Labs reviewed , EKG interpreted , Old EKG reviewed, Old chart reviewed, Notes from prior ED visits and  [place] Controlled Substance Database   Please note:  Patient was evaluated in Emergency Department today for the symptoms described in the history of present illness. Patient was evaluated in the context of the global COVID-19 pandemic, which necessitated consideration that the patient might be at risk for infection with the SARS-CoV-2 virus that causes COVID-19. Institutional protocols and algorithms that pertain to the evaluation of patients at risk for COVID-19 are in a state of rapid change based on information released by regulatory bodies including the CDC and federal and state organizations. These policies and algorithms were followed during the patient's care in the ED.  Some ED evaluations and interventions may be delayed as a result of limited staffing during the pandemic.   ____________________________________________   FINAL CLINICAL IMPRESSION(S) / ED  DIAGNOSES   Final diagnoses:  SOB (shortness of breath)      NEW MEDICATIONS STARTED DURING THIS VISIT:  ED Discharge Orders         Ordered    predniSONE (DELTASONE) 10 MG tablet  Daily     04/21/19 0839    albuterol (VENTOLIN HFA) 108 (90 Base) MCG/ACT inhaler  Every 6 hours PRN     04/21/19 0840           Note:  This document was prepared using Dragon voice recognition software and may include unintentional dictation errors.    Rudene Re, MD 04/22/19 [phone]

## 2019-04-21 NOTE — ED Triage Notes (Addendum)
Pt arrives A&O, ambulatory. Provided mask. States here for SOB. Refused wheelchair. States "I was dx with early stages of COPD a long time ago." states SOB began this morning. Talking in complete sentences. Denies fever. States "bad bad cough"

## 2019-04-23 LAB — NOVEL CORONAVIRUS, NAA (HOSP ORDER, SEND-OUT TO REF LAB; TAT 18-24 HRS): SARS-CoV-2, NAA: NOT DETECTED

## 2019-07-29 ENCOUNTER — Other Ambulatory Visit: Payer: Self-pay

## 2019-07-29 ENCOUNTER — Emergency Department
Admission: EM | Admit: 2019-07-29 | Discharge: 2019-07-29 | Disposition: A | Discharge status: Home / Self Care | Payer: Self-pay | Attending: Emergency Medicine | Admitting: Emergency Medicine

## 2019-07-29 ENCOUNTER — Encounter: Payer: Self-pay | Admitting: Emergency Medicine

## 2019-07-29 DIAGNOSIS — R21 Rash and other nonspecific skin eruption: Secondary | ICD-10-CM | POA: Insufficient documentation

## 2019-07-29 DIAGNOSIS — Z79899 Other long term (current) drug therapy: Secondary | ICD-10-CM | POA: Insufficient documentation

## 2019-07-29 DIAGNOSIS — B958 Unspecified staphylococcus as the cause of diseases classified elsewhere: Secondary | ICD-10-CM | POA: Insufficient documentation

## 2019-07-29 DIAGNOSIS — F17210 Nicotine dependence, cigarettes, uncomplicated: Secondary | ICD-10-CM | POA: Insufficient documentation

## 2019-07-29 DIAGNOSIS — Z8619 Personal history of other infectious and parasitic diseases: Secondary | ICD-10-CM

## 2019-07-29 MED ORDER — SULFAMETHOXAZOLE-TRIMETHOPRIM 800-160 MG PO TABS
1.0000 | ORAL_TABLET | Freq: Once | ORAL | Status: AC
  Administered 2019-07-29: 1 via ORAL
  Filled 2019-07-29: qty 1

## 2019-07-29 MED ORDER — VALACYCLOVIR HCL 500 MG PO TABS
1000.0000 mg | ORAL_TABLET | Freq: Once | ORAL | Status: AC
  Administered 2019-07-29: 1000 mg via ORAL
  Filled 2019-07-29: qty 2

## 2019-07-29 MED ORDER — SULFAMETHOXAZOLE-TRIMETHOPRIM 800-160 MG PO TABS: 1.0000 | ORAL_TABLET | Freq: Two times a day (BID) | ORAL | 0 refills | Status: DC

## 2019-07-29 MED ORDER — VALACYCLOVIR HCL 1 G PO TABS: 2000.0000 mg | ORAL_TABLET | Freq: Two times a day (BID) | ORAL | 0 refills | Status: AC

## 2019-07-29 NOTE — ED Notes (Signed)
Pt states he feels fine to walk to lobby, pt with steady gait. Pt discharged.

## 2019-07-29 NOTE — ED Notes (Signed)
Waiting on missing med, message sent to pharmacy.

## 2019-07-29 NOTE — ED Triage Notes (Signed)
Pt arrives ambulatory to triage with c/o facial swelling x 1 week. Pt has what appears to be an abscess forming above his top lip. Pt is in NAD.
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
## 2019-08-03 ENCOUNTER — Other Ambulatory Visit: Payer: Self-pay

## 2019-08-03 ENCOUNTER — Encounter: Payer: Self-pay | Attending: Physician Assistant | Admitting: Physician Assistant

## 2019-08-03 DIAGNOSIS — L0100 Impetigo, unspecified: Secondary | ICD-10-CM | POA: Insufficient documentation

## 2019-08-03 DIAGNOSIS — L98491 Non-pressure chronic ulcer of skin of other sites limited to breakdown of skin: Secondary | ICD-10-CM | POA: Insufficient documentation

## 2019-08-03 DIAGNOSIS — Z8249 Family history of ischemic heart disease and other diseases of the circulatory system: Secondary | ICD-10-CM | POA: Insufficient documentation

## 2019-08-03 NOTE — Progress Notes (Addendum)
KHING, BELCHER ([id number]) Visit Report for 08/03/2019 Allergy List Details Patient Name: Matthew Tapia, Matthew Tapia Date of Service: 08/03/2019 2:15 PM Medical Record Number: [id number] Patient Account Number: [id number] Date of Birth/Sex: June 01, 1965 (54 y.o. M) Treating RN: Curtis Sites Primary Care Khush Pasion: PATIENT, NO Other Clinician: Referring Holiday Mcmenamin: STAFFORD, PHILLIP Treating Shakema Surita/Extender: STONE III, HOYT Weeks in Treatment: 0 Allergies Active Allergies No Known Drug Allergies Allergy Notes Electronic Signature(s) Signed: 08/03/2019 4:05:00 PM By: Curtis Sites Entered By: Curtis Sites on 08/03/2019 14:24:14 [name] ([id number]) -------------------------------------------------------------------------------- Arrival Information Details Patient Name: [name] Date of Service: 08/03/2019 2:15 PM Medical Record Number: [id number] Patient Account Number: [id number] Date of Birth/Sex: [birth year] (53 y.o. M) Treating RN: Curtis Sites Primary Care Fahd Galea: PATIENT, NO Other Clinician: Referring Burle Kwan: STAFFORD, PHILLIP Treating Bailen Geffre/Extender: STONE III, HOYT Weeks in Treatment: 0 Visit Information Patient Arrived: Ambulatory Arrival Time: 14:23 Accompanied By: self Transfer Assistance: None Patient Identification Verified: Yes Secondary Verification Process Completed: Yes Electronic Signature(s) Signed: 08/03/2019 4:05:00 PM By: Curtis Sites Entered By: Curtis Sites on 08/03/2019 14:23:58 [name] ([id number]) -------------------------------------------------------------------------------- Clinic Level of Care Assessment Details Patient Name: [name] Date of Service: 08/03/2019 2:15 PM Medical Record Number: [id number] Patient Account Number: [id number] Date of Birth/Sex: 06-25-1965 (53 y.o. M) Treating RN: Rodell Perna Primary Care Dalaney Needle: PATIENT, NO Other Clinician: Referring Remedios Mckone: STAFFORD, PHILLIP Treating  Yacqub Baston/Extender: STONE III, HOYT Weeks in Treatment: 0 Clinic Level of Care Assessment Items TOOL 2 Quantity Score []  - Use when only an EandM is performed on the INITIAL visit 0 ASSESSMENTS - Nursing Assessment / Reassessment X - General Physical Exam (combine w/ comprehensive assessment (listed just below) when performed on new pt. 1 20 evals) X- 1 25 Comprehensive Assessment (HX, ROS, Risk Assessments, Wounds Hx, etc.) ASSESSMENTS - Wound and Skin Assessment / Reassessment X - Simple Wound Assessment / Reassessment - one wound 1 5 []  - 0 Complex Wound Assessment / Reassessment - multiple wounds []  - 0 Dermatologic / Skin Assessment (not related to wound area) ASSESSMENTS - Ostomy and/or Continence Assessment and Care []  - Incontinence Assessment and Management 0 []  - 0 Ostomy Care Assessment and Management (repouching, etc.) PROCESS - Coordination of Care X - Simple Patient / Family Education for ongoing care 1 15 []  - 0 Complex (extensive) Patient / Family Education for ongoing care X- 1 10 Staff obtains , Records, Test Results / Process Orders []  - 0 Staff telephones HHA, Nursing Homes / Clarify orders / etc []  - 0 Routine Transfer to another Facility (non-emergent condition) []  - 0 Routine Hospital Admission (non-emergent condition) []  - 0 New Admissions / / Ordering NPWT, Apligraf, etc. []  - 0 Emergency Hospital Admission (emergent condition) X- 1 10 Simple Discharge Coordination []  - 0 Complex (extensive) Discharge Coordination PROCESS - Special Needs []  - Pediatric / Minor Patient Management 0 []  - 0 Isolation Patient Management []  - 0 Hearing / Language / Visual special needs []  - 0 Assessment of Community assistance (transportation, D/C planning, etc.) []  - 0 Additional assistance / Altered mentation []  - 0 Support Surface(s) Assessment (bed, cushion, seat, etc.) INTERVENTIONS - Wound Cleansing / Measurement X - Wound  Imaging (photographs - any number of wounds) 1 5 Slocumb, Eddy D. ( ) []  - 0 Wound Tracing (instead of photographs) X- 1 5 Simple Wound Measurement - one wound []  - 0 Complex Wound Measurement - multiple wounds X- 1 5 Simple Wound Cleansing - one wound []  - 0 Complex  Wound Cleansing - multiple wounds INTERVENTIONS - Wound Dressings X - Small Wound Dressing one or multiple wounds 1 10 []  - 0 Medium Wound Dressing one or multiple wounds []  - 0 Large Wound Dressing one or multiple wounds []  - 0 Application of Medications - injection INTERVENTIONS - Miscellaneous []  - External ear exam 0 []  - 0 Specimen Collection (cultures, biopsies, blood, body fluids, etc.) []  - 0 Specimen(s) / Culture(s) sent or taken to Lab for analysis []  - 0 Patient Transfer (multiple staff / Harrel Lemon Lift / Similar devices) []  - 0 Simple Staple / Suture removal (25 or less) []  - 0 Complex Staple / Suture removal (26 or more) []  - 0 Hypo / Hyperglycemic Management (close monitor of Blood Glucose) []  - 0 Ankle / Brachial Index (ABI) - do not check if billed separately Has the patient been seen at the hospital within the last three years: Yes Total Score: 110 Level Of Care: New/Established - Level 3 Electronic Signature(s) Signed: 08/03/2019 3:43:50 PM By: Army Melia Entered By: Army Melia on 08/03/2019 14:56:35 Corinna Capra ([id number]) -------------------------------------------------------------------------------- Lower Extremity Assessment Details Patient Name: Corinna Capra Date of Service: 08/03/2019 2:15 PM Medical Record Number: [id number] Patient Account Number: [id number] Date of Birth/Sex: [birth year] (54 y.o. M) Treating RN: Montey Hora Primary Care Crystalle Popwell: PATIENT, NO Other Clinician: Referring Witney Huie: Joni Fears, PHILLIP Treating Ahmarion Saraceno/Extender: STONE III, HOYT Weeks in Treatment: 0 Electronic Signature(s) Signed: 08/03/2019 4:05:00 PM By: Montey Hora Entered  By: Montey Hora on 08/03/2019 14:26:14 Corinna Capra ([id number]) -------------------------------------------------------------------------------- Multi Wound Chart Details Patient Name: Corinna Capra Date of Service: 08/03/2019 2:15 PM Medical Record Number: [id number] Patient Account Number: [id number] Date of Birth/Sex: 12-12-1965 (53 y.o. M) Treating RN: Army Melia Primary Care Rosevelt Luu: PATIENT, NO Other Clinician: Referring Jahnya Trindade: STAFFORD, PHILLIP Treating Angelette Ganus/Extender: STONE III, HOYT Weeks in Treatment: 0 Vital Signs Height(in): 66 Pulse(bpm): 81 Weight(lbs): 198 Blood Pressure(mmHg): 139/89 Body Mass Index(BMI): 32 Temperature(F): 98.8 Respiratory Rate(breaths/min): 16 Photos: [N/A:N/A] Wound Location: Mouth - Proximal N/A N/A Wounding Event: Gradually Appeared N/A N/A Primary Etiology: Inflammatory N/A N/A Date Acquired: 07/20/2019 N/A N/A Weeks of Treatment: 0 N/A N/A Wound Status: Open N/A N/A Measurements L x W x D (cm) 1.1x2.3x0.1 N/A N/A Area (cm) : 1.987 N/A N/A Volume (cm) : 0.199 N/A N/A Classification: Partial Thickness N/A N/A Exudate Amount: Medium N/A N/A Exudate Type: Serous N/A N/A Exudate Color: amber N/A N/A Wound Margin: Flat and Intact N/A N/A Granulation Amount: Large (67-100%) N/A N/A Granulation Quality: Pale, Hyper-granulation N/A N/A Necrotic Amount: None Present (0%) N/A N/A Exposed Structures: Fat Layer (Subcutaneous Tissue) N/A N/A Exposed: Yes Fascia: No Tendon: No Muscle: No Joint: No Bone: No Epithelialization: Large (67-100%) N/A N/A Treatment Notes Electronic Signature(s) Signed: 08/03/2019 3:43:50 PM By: Army Melia Entered By: Army Melia on 08/03/2019 14:45:42 Corinna Capra ([id number]) -------------------------------------------------------------------------------- [place] Details Patient Name: Corinna Capra Date of Service: 08/03/2019 2:15 PM Medical Record Number:  [id number] Patient Account Number: [id number] Date of Birth/Sex: 10-07-1965 (54 y.o. M) Treating RN: Army Melia Primary Care Kycen Spalla: PATIENT, NO Other Clinician: Referring Akayla Brass: Joni Fears, PHILLIP Treating Niara Bunker/Extender: Melburn Hake, HOYT Weeks in Treatment: 0 Active Inactive Electronic Signature(s) Signed: 08/15/2019 10:28:00 AM By: Gretta Cool, BSN, RN, CWS, Kim RN, BSN Signed: 08/21/2019 11:52:58 AM By: Army Melia Previous Signature: 08/03/2019 3:43:50 PM Version By: Army Melia Entered By: Gretta Cool BSN, RN, CWS, Kim on 08/15/2019 10:28:00 Corinna Capra ([id number]) -------------------------------------------------------------------------------- Pain Assessment Details Patient Name: GRAY, DOERING.  Date of Service: 08/03/2019 2:15 PM Medical Record Number: [id number] Patient Account Number: [id number] Date of Birth/Sex: Mar 18, 1966 (54 y.o. M) Treating RN: Curtis Sites Primary Care Cameshia Cressman: PATIENT, NO Other Clinician: Referring Trinitey Roache: STAFFORD, PHILLIP Treating Sherissa Tenenbaum/Extender: STONE III, HOYT Weeks in Treatment: 0 Active Problems Location of Pain Severity and Description of Pain Patient Has Paino No Site Locations Pain Management and Medication Current Pain Management: Electronic Signature(s) Signed: 08/03/2019 4:05:00 PM By: Curtis Sites Entered By: Curtis Sites on 08/03/2019 14:24:04 [name] ([id number]) -------------------------------------------------------------------------------- Patient/Caregiver Education Details Patient Name: [name] Date of Service: 08/03/2019 2:15 PM Medical Record Number: [id number] Patient Account Number: [id number] Date of Birth/Gender: [birth year] (54 y.o. M) Treating RN: Rodell Perna Primary Care Physician: PATIENT, NO Other Clinician: Referring Physician: STAFFORD, PHILLIP Treating Physician/Extender: Linwood Dibbles, HOYT Weeks in Treatment: 0 Education Assessment Education Provided To: Patient Education Topics  Provided Wound/Skin Impairment: Handouts: Caring for Your Ulcer Methods: Demonstration, Explain/Verbal Responses: State content correctly Electronic Signature(s) Signed: 08/03/2019 3:43:50 PM By: Rodell Perna Entered By: Rodell Perna on 08/03/2019 14:56:47 [name] ([id number]) -------------------------------------------------------------------------------- Wound Assessment Details Patient Name: [name] Date of Service: 08/03/2019 2:15 PM Medical Record Number: [id number] Patient Account Number: [id number] Date of Birth/Sex: Aug 24, 1965 (54 y.o. M) Treating RN: Curtis Sites Primary Care Keylani Perlstein: PATIENT, NO Other Clinician: Referring Daina Cara: STAFFORD, PHILLIP Treating Keron Neenan/Extender: STONE III, HOYT Weeks in Treatment: 0 Wound Status Wound Number: 1 Primary Etiology: Inflammatory Wound Location: Mouth - Proximal Wound Status: Open Wounding Event: Gradually Appeared Date Acquired: 07/20/2019 Weeks Of Treatment: 0 Clustered Wound: No Photos Wound Measurements Length: (cm) 1.1 Width: (cm) 2.3 Depth: (cm) 0.1 Area: (cm) 1.987 Volume: (cm) 0.199 % Reduction in Area: % Reduction in Volume: Epithelialization: Large (67-100%) Tunneling: No Undermining: No Wound Description Classification: Partial Thickness Wound Margin: Flat and Intact Exudate Amount: Medium Exudate Type: Serous Exudate Color: amber Foul Odor After Cleansing: No Slough/Fibrino No Wound Bed Granulation Amount: Large (67-100%) Exposed Structure Granulation Quality: Pale, Hyper-granulation Fascia Exposed: No Necrotic Amount: None Present (0%) Fat Layer (Subcutaneous Tissue) Exposed: Yes Tendon Exposed: No Muscle Exposed: No Joint Exposed: No Bone Exposed: No Electronic Signature(s) Signed: 08/03/2019 4:05:00 PM By: Curtis Sites Entered By: Curtis Sites on 08/03/2019 14:36:49 [name]  ([id number]) -------------------------------------------------------------------------------- Vitals Details Patient Name: [name] Date of Service: 08/03/2019 2:15 PM Medical Record Number: [id number] Patient Account Number: [id number] Date of Birth/Sex: [birth year] (54 y.o. M) Treating RN: Curtis Sites Primary Care Genasis Zingale: PATIENT, NO Other Clinician: Referring Inioluwa Baris: STAFFORD, PHILLIP Treating Iker Nuttall/Extender: STONE III, HOYT Weeks in Treatment: 0 Vital Signs Time Taken: 14:26 Temperature (F): 98.8 Height (in): 66 Pulse (bpm): 81 Source: Measured Respiratory Rate (breaths/min): 16 Weight (lbs): 198 Blood Pressure (mmHg): 139/89 Source: Measured Reference Range: 80 - 120 mg / dl Body Mass Index (BMI): 32 Electronic Signature(s) Signed: 08/03/2019 4:05:00 PM By: Curtis Sites Entered By: Curtis Sites on 08/03/2019 14:27:21

## 2019-08-03 NOTE — Progress Notes (Signed)
Matthew Tapia, Matthew Tapia ([id number]) Visit Report for 08/03/2019 Abuse/Suicide Risk Screen Details Patient Name: Matthew Tapia, Matthew Tapia Date of Service: 08/03/2019 2:15 PM Medical Record Number: [id number] Patient Account Number: [id number] Date of Birth/Sex: [birth year] (54 y.o. M) Treating RN: Montey Hora Primary Care Erik Burkett: PATIENT, NO Other Clinician: Referring Ahnya Akre: STAFFORD, PHILLIP Treating Tametria Aho/Extender: STONE III, HOYT Weeks in Treatment: 0 Abuse/Suicide Risk Screen Items Answer ABUSE RISK SCREEN: Has anyone close to you tried to hurt or harm you recentlyo No Do you feel uncomfortable with anyone in your familyo No Has anyone forced you do things that you didnot want to doo No Electronic Signature(s) Signed: 08/03/2019 4:05:00 PM By: Montey Hora Entered By: Montey Hora on 08/03/2019 14:24:22 Matthew Tapia ([id number]) -------------------------------------------------------------------------------- Activities of Daily Living Details Patient Name: Matthew Tapia Date of Service: 08/03/2019 2:15 PM Medical Record Number: [id number] Patient Account Number: [id number] Date of Birth/Sex: Sep 18, 1965 (53 y.o. M) Treating RN: Montey Hora Primary Care Gradyn Shein: PATIENT, NO Other Clinician: Referring Ranesha Val: STAFFORD, PHILLIP Treating Nailea Whitehorn/Extender: STONE III, HOYT Weeks in Treatment: 0 Activities of Daily Living Items Answer Activities of Daily Living (Please select one for each item) Drive Automobile Completely Able Take Medications Completely Able Use Telephone Completely Able Care for Appearance Completely Able Use Toilet Completely Able Bath / Shower Completely Able Dress Self Completely Able Feed Self Completely Able Walk Completely Able Get In / Out Bed Completely Able Housework Completely Able Prepare Meals Completely [place] for Self Completely Able Electronic Signature(s) Signed: 08/03/2019 4:05:00 PM By: Montey Hora Entered By: Montey Hora on 08/03/2019 14:24:41 Matthew Tapia ([id number]) -------------------------------------------------------------------------------- Education Screening Details Patient Name: Matthew Tapia Date of Service: 08/03/2019 2:15 PM Medical Record Number: [id number] Patient Account Number: [id number] Date of Birth/Sex: 04/09/1966 (54 y.o. M) Treating RN: Montey Hora Primary Care Siri Buege: PATIENT, NO Other Clinician: Referring Ramonia Mcclaran: Joni Fears, PHILLIP Treating Amritpal Shropshire/Extender: Melburn Hake, HOYT Weeks in Treatment: 0 Primary Learner Assessed: Patient Learning Preferences/Education Level/Primary Language Learning Preference: Explanation, Demonstration Highest Education Level: High School Preferred Language: English Cognitive Barrier Language Barrier: No Translator Needed: No Memory Deficit: No Emotional Barrier: No Cultural/Religious Beliefs Affecting Medical Care: No Physical Barrier Impaired Vision: No Impaired Hearing: No Decreased Hand dexterity: No Knowledge/Comprehension Knowledge Level: Medium Comprehension Level: Medium Ability to understand written instructions: Medium Ability to understand verbal instructions: Medium Motivation Anxiety Level: Calm Cooperation: Cooperative Education Importance: Acknowledges Need Interest in Health Problems: Asks Questions Perception: Coherent Willingness to Engage in Self-Management Medium Activities: Readiness to Engage in Self-Management Medium Activities: Electronic Signature(s) Signed: 08/03/2019 4:05:00 PM By: Montey Hora Entered By: Montey Hora on 08/03/2019 14:25:12 Matthew Tapia ([id number]) -------------------------------------------------------------------------------- Fall Risk Assessment Details Patient Name: Matthew Tapia Date of Service: 08/03/2019 2:15 PM Medical Record Number: [id number] Patient Account Number: [id number] Date of Birth/Sex: 1965-10-13 (54 y.o.  M) Treating RN: Montey Hora Primary Care Buffy Ehler: PATIENT, NO Other Clinician: Referring Juliann Olesky: STAFFORD, PHILLIP Treating Nazyia Gaugh/Extender: STONE III, HOYT Weeks in Treatment: 0 Fall Risk Assessment Items Have you had 2 or more falls in the last 12 monthso 0 No Have you had any fall that resulted in injury in the last 12 monthso 0 No FALLS RISK SCREEN History of falling - immediate or within 3 months 0 No Secondary diagnosis (Do you have 2 or more medical diagnoseso) 0 No Ambulatory aid None/bed rest/wheelchair/nurse 0 Yes Crutches/cane/walker 0 No Furniture 0 No Intravenous therapy Access/Saline/Heparin Lock 0 No Gait/Transferring Normal/ bed rest/ wheelchair 0 Yes Weak (short steps  with or without shuffle, stooped but able to lift head while walking, may seek 0 No support from furniture) Impaired (short steps with shuffle, may have difficulty arising from chair, head down, impaired 0 No balance) Mental Status Oriented to own ability 0 Yes Electronic Signature(s) Signed: 08/03/2019 4:05:00 PM By: Curtis Sites Entered By: Curtis Sites on 08/03/2019 14:25:48 Matthew Tapia ([id number]) -------------------------------------------------------------------------------- Foot Assessment Details Patient Name: Matthew Tapia Date of Service: 08/03/2019 2:15 PM Medical Record Number: [id number] Patient Account Number: [id number] Date of Birth/Sex: 10-Nov-1965 (54 y.o. M) Treating RN: Curtis Sites Primary Care Sharanda Shinault: PATIENT, NO Other Clinician: Referring Aarian Cleaver: STAFFORD, PHILLIP Treating Donell Tomkins/Extender: STONE III, HOYT Weeks in Treatment: 0 Foot Assessment Items Site Locations + = Sensation present, - = Sensation absent, C = Callus, U = Ulcer R = Redness, W = Warmth, M = Maceration, PU = Pre-ulcerative lesion F = Fissure, S = Swelling, D = Dryness Assessment Right: Left: Other Deformity: No No Prior Foot Ulcer: No No Prior Amputation: No No Charcot  Joint: No No Ambulatory Status: Ambulatory Without Help Gait: Steady Electronic Signature(s) Signed: 08/03/2019 4:05:00 PM By: Curtis Sites Entered By: Curtis Sites on 08/03/2019 14:26:03 Matthew Tapia ([id number]) -------------------------------------------------------------------------------- Nutrition Risk Screening Details Patient Name: Matthew Tapia Date of Service: 08/03/2019 2:15 PM Medical Record Number: [id number] Patient Account Number: [id number] Date of Birth/Sex: 1965/09/28 (54 y.o. M) Treating RN: Curtis Sites Primary Care Hedwig Mcfall: PATIENT, NO Other Clinician: Referring Eimi Viney: STAFFORD, PHILLIP Treating Brizeyda Holtmeyer/Extender: STONE III, HOYT Weeks in Treatment: 0 Height (in): Weight (lbs): Body Mass Index (BMI): Nutrition Risk Screening Items Score Screening NUTRITION RISK SCREEN: I have an illness or condition that made me change the kind and/or amount of food I eat 0 No I eat fewer than two meals per day 0 No I eat few fruits and vegetables, or milk products 0 No I have three or more drinks of beer, liquor or wine almost every day 0 No I have tooth or mouth problems that make it hard for me to eat 0 No I don't always have enough money to buy the food I need 0 No I eat alone most of the time 0 No I take three or more different prescribed or over-the-counter drugs a day 0 No Without wanting to, I have lost or gained 10 pounds in the last six months 0 No I am not always physically able to shop, cook and/or feed myself 0 No Nutrition Protocols Good Risk Protocol 0 No interventions needed Moderate Risk Protocol High Risk Proctocol Risk Level: Good Risk Score: 0 Electronic Signature(s) Signed: 08/03/2019 4:05:00 PM By: Curtis Sites Entered By: Curtis Sites on 08/03/2019 14:25:56

## 2019-08-04 NOTE — Progress Notes (Addendum)
JULIA, KULZER ([id number]) Visit Report for 08/03/2019 Chief Complaint Document Details Patient Name: Matthew Tapia, Matthew Tapia Date of Service: 08/03/2019 2:15 PM Medical Record Number: [id number] Patient Account Number: [id number] Date of Birth/Sex: 1965-07-30 (54 y.o. M) Treating RN: Rodell Perna Primary Care Provider: PATIENT, NO Other Clinician: Referring Provider: Scotty Court, PHILLIP Treating Provider/Extender: STONE III, Shweta Aman Weeks in Treatment: 0 Information Obtained from: Patient Chief Complaint Upper lip ulcer/impetigo Electronic Signature(s) Signed: 08/04/2019 4:19:26 PM By: Lenda Kelp PA-C Entered By: Lenda Kelp on 08/03/2019 23:53:25 Matthew Tapia ([id number]) -------------------------------------------------------------------------------- HPI Details Patient Name: Matthew Tapia Date of Service: 08/03/2019 2:15 PM Medical Record Number: [id number] Patient Account Number: [id number] Date of Birth/Sex: July 05, 1965 (53 y.o. M) Treating RN: Rodell Perna Primary Care Provider: PATIENT, NO Other Clinician: Referring Provider: STAFFORD, PHILLIP Treating Provider/Extender: STONE III, Kyro Joswick Weeks in Treatment: 0 History of Present Illness HPI Description: 08/03/2019 patient presents today for initial evaluation here in clinic concerning issues that he has been having with a rash and weeping area on the upper lip which has been present since July 20, 2019. He tells me that it does hurt and his lip is also somewhat swollen. Fortunately there is no signs of systemic infection which is great news. Overall the patient is somewhat frustrated with the fact that this does not seem to want to go away. He really has no other major medical problems at this point other than this. He has been seeing his primary care provider and they recommended some topical antibiotic ointment but nothing else at this time. Electronic Signature(s) Signed: 01/08/2020 3:11:48 PM By: Lenda Kelp  PA-C Entered By: Lenda Kelp on 01/08/2020 15:11:47 Matthew Tapia ([id number]) -------------------------------------------------------------------------------- Physical Exam Details Patient Name: Matthew Tapia Date of Service: 08/03/2019 2:15 PM Medical Record Number: [id number] Patient Account Number: [id number] Date of Birth/Sex: 11-Sep-1965 (53 y.o. M) Treating RN: Rodell Perna Primary Care Provider: PATIENT, NO Other Clinician: Referring Provider: STAFFORD, PHILLIP Treating Provider/Extender: STONE III, Gael Delude Weeks in Treatment: 0 Constitutional supine blood pressure is within target range for patient.. pulse regular and within target range for patient.Marland Kitchen respirations regular, non-labored and within target range for patient.Marland Kitchen temperature within target range for patient.. Well-nourished and well-hydrated in no acute distress. Eyes conjunctiva clear no eyelid edema noted. pupils equal round and reactive to light and accommodation. Ears, Nose, Mouth, and Throat no gross abnormality of ear auricles or external auditory canals. normal hearing noted during conversation. mucus membranes moist. Respiratory normal breathing without difficulty. Cardiovascular no clubbing, cyanosis, significant edema, <3 sec cap refill. Musculoskeletal normal gait and posture. no significant deformity or arthritic changes, no loss or range of motion, no clubbing. Psychiatric this patient is able to make decisions and demonstrates good insight into disease process. Alert and Oriented x 3. pleasant and cooperative. Notes Upon inspection patient appears to have on his upper lip a rash consistent with impetigo which I think we can likely treat even with potentially some topical antibiotic ointment. With that being said he does have some discomfort and draining but again there does not appear to be anything more significant going on at this point. Electronic Signature(s) Signed: 01/08/2020 3:14:37 PM By:  Lenda Kelp PA-C Entered By: Lenda Kelp on 01/08/2020 15:14:36 Matthew Tapia ([id number]) -------------------------------------------------------------------------------- Physician Orders Details Patient Name: Matthew Tapia Date of Service: 08/03/2019 2:15 PM Medical Record Number: [id number] Patient Account Number: [id number] Date of Birth/Sex: 1966/01/14 (53 y.o. M) Treating RN: Rodell Perna Primary Care Provider:  PATIENT, NO Other Clinician: Referring Provider: STAFFORD, PHILLIP Treating Provider/Extender: STONE III, Marsa Matteo Weeks in Treatment: 0 Verbal / Phone Orders: No Diagnosis Coding Wound Cleansing Wound #1 Proximal Mouth o Clean wound with Normal Saline. - in office Skin Barriers/Peri-Wound Care Wound #1 Proximal Mouth o Antifungal cream - mupirocin Dressing Change Frequency Wound #1 Proximal Mouth o Change dressing twice daily. Follow-up Appointments Wound #1 Proximal Mouth o Return Appointment in 1 week. Patient Medications Allergies: No Known Drug Allergies Notifications Medication Indication Start End mupirocin 08/03/2019 DOSE topical 2 % ointment - ointment topical applied 2-3 times per day until healed Electronic Signature(s) Signed: 08/03/2019 11:54:39 PM By: Lenda Kelp PA-C Previous Signature: 08/03/2019 3:43:50 PM Version By: Rodell Perna Entered By: Lenda Kelp on 08/03/2019 23:54:39 Vital, Dena Billet ([id number]) -------------------------------------------------------------------------------- Problem List Details Patient Name: Matthew Tapia Date of Service: 08/03/2019 2:15 PM Medical Record Number: [id number] Patient Account Number: [id number] Date of Birth/Sex: 04/22/1966 (54 y.o. 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 M) Treating RN: Rodell Perna Primary Care Provider: PATIENT, NO Other Clinician: Referring Provider: STAFFORD, PHILLIP Treating Provider/Extender: STONE III, Kendry Pfarr Weeks in Treatment: 0 Subjective Chief Complaint Information obtained from Patient Upper lip ulcer/impetigo History of Present Illness (HPI) 08/03/2019 patient presents today for initial evaluation here in clinic concerning issues that he has been having with a rash and weeping area on the upper lip which has been present since July 20, 2019. He tells me that it does hurt and his lip is also somewhat swollen. Fortunately there is no signs of systemic infection which is great news. Overall the patient is somewhat frustrated with the fact that this does not seem to want to go away. He really has no other major medical problems at this point other than this. He has been seeing his primary care provider and they recommended some topical antibiotic ointment but nothing else at this time. Patient History Information obtained from Patient. Allergies No Known Drug Allergies Family History Heart Disease - Father,Paternal Grandparents, Hypertension - Father,Paternal Grandparents, Lung Disease - Maternal Grandparents, No family history of Cancer, Diabetes, Hereditary Spherocytosis, Kidney Disease,  Seizures, Stroke, Thyroid Problems, Tuberculosis. Social History Current every day smoker, Marital Status - Separated, Alcohol Use - Never, Drug Use - No History, Caffeine Use - Daily. Medical History Ear/Nose/Mouth/Throat Denies history of Chronic sinus problems/congestion, Middle ear problems Integumentary (Skin) Denies history of History of Burn, History of pressure wounds Medical And Surgical History Notes Ear/Nose/Mouth/Throat hx of cold sores Review of Systems (ROS) Constitutional Symptoms (General Health) Denies complaints or symptoms of Fatigue, Fever, Chills, Marked Weight Change. Eyes Denies complaints or symptoms of Dry Eyes, Vision Changes, Glasses / Contacts. Ear/Nose/Mouth/Throat Denies complaints or symptoms of Difficult clearing ears, Sinusitis. Hematologic/Lymphatic Denies complaints or symptoms of Bleeding / Clotting Disorders, Human Immunodeficiency Virus. Respiratory Denies complaints or symptoms of Chronic or frequent  coughs, Shortness of Breath. Cardiovascular Denies complaints or symptoms of Chest pain, LE edema. Gastrointestinal Denies complaints or symptoms of Frequent diarrhea, Nausea, Vomiting. Endocrine Denies complaints or symptoms of Hepatitis, Thyroid disease, Polydypsia (Excessive Thirst). Genitourinary Denies complaints or symptoms of Kidney failure/ Dialysis, Incontinence/dribbling. Immunological Denies complaints or symptoms of Hives, Itching. Integumentary (Skin) Complains or has symptoms of Wounds. Denies complaints or symptoms of Bleeding or bruising tendency, Breakdown, Swelling. Musculoskeletal Denies complaints or symptoms of Muscle Pain, Muscle Weakness. Neurologic Denies complaints or symptoms of Numbness/parasthesias, Focal/Weakness. Psychiatric JMARI, PELC ([id number]) Denies complaints or symptoms of Anxiety, Claustrophobia. Objective Constitutional supine blood pressure is within target range for patient.. pulse regular  and within target range for patient.Marland Kitchen respirations regular, non-labored and within target range for patient.Marland Kitchen temperature within target range for patient.. Well-nourished and well-hydrated in no acute distress. Vitals Time Taken: 2:26 PM, Height: 66 in, Source: Measured, Weight: 198 lbs, Source: Measured, BMI: 32, Temperature: 98.8 F, Pulse: 81 bpm, Respiratory Rate: 16 breaths/min, Blood Pressure: 139/89 mmHg. Eyes conjunctiva clear no eyelid edema noted. pupils equal round and reactive to light and accommodation. Ears, Nose, Mouth, and Throat no gross abnormality of ear auricles or external auditory canals. normal hearing noted during conversation. mucus membranes moist. Respiratory normal breathing without difficulty. Cardiovascular no clubbing, cyanosis, significant edema, Musculoskeletal normal gait and posture. no significant deformity or arthritic changes, no loss or range of motion, no clubbing. Psychiatric this patient is able to make decisions and demonstrates good insight into disease process. Alert and Oriented x 3. pleasant and cooperative. General Notes: Upon inspection patient appears to have on his upper lip a rash consistent with impetigo which I think we can likely treat even with potentially some topical antibiotic ointment. With that being said he does have some discomfort and draining but again there does not appear to be anything more significant going on at this point. Integumentary (Hair, Skin) Wound #1 status is Open. Original cause of wound was Gradually Appeared. The wound is located on the Proximal Mouth. The wound measures 1.1cm length x 2.3cm width x 0.1cm depth; 1.987cm^2 area and 0.199cm^3 volume. There is Fat Layer (Subcutaneous Tissue) Exposed exposed. There is no tunneling or undermining noted. There is a medium amount of serous drainage noted. The wound margin is flat and intact. There is large (67-100%) pale, hyper - granulation within the wound bed.  There is no necrotic tissue within the wound bed. Assessment Active Problems ICD-10 Other impetigo Non-pressure chronic ulcer of skin of other sites limited to breakdown of skin Plan Wound Cleansing: Wound #1 Proximal Mouth: Clean wound with Normal Saline. - in office Skin Barriers/Peri-Wound Care: Wound #1 Proximal Mouth: Antifungal cream - mupirocin Dressing Change Frequency: Wound #1 Proximal Mouth: Change dressing twice daily. TRYGG, MANTZ ([id number]) Follow-up Appointments: Wound #1 Proximal Mouth: Return Appointment in 1 week. The following medication(s) was prescribed: mupirocin topical 2 % ointment ointment topical applied 2-3 times per day until healed starting 08/03/2019 1. I would recommend currently that we actually go ahead and initiate treatment with mupirocin ointment which I think is good to be the best way to go at this point. 2. I am also can recommend that the patient continue to monitor for anything worsening in particular. Obviously if this gets worse or tends to spread we can always do an oral medication but I do not think that seem to be necessary. 3. I am also can recommend that he continue to monitor for any signs of sores  in his mouth itself which could indicate a different type of infection/virus. We will see patient back for reevaluation in 1 week here in the clinic. If anything worsens or changes patient will contact our office for additional recommendations. Electronic Signature(s) Signed: 01/08/2020 3:15:25 PM By: Worthy Keeler PA-C Entered By: Worthy Keeler on 01/08/2020 15:15:24 Corinna Capra ([id number]) -------------------------------------------------------------------------------- ROS/PFSH Details Patient Name: Corinna Capra Date of Service: 08/03/2019 2:15 PM Medical Record Number: [id number] Patient Account Number: [id number] Date of Birth/Sex: March 14, 1966 (53 y.o. M) Treating RN: Montey Hora Primary Care Provider: PATIENT, NO  Other Clinician: Referring Provider: STAFFORD, PHILLIP Treating Provider/Extender: STONE III, Zhana Jeangilles Weeks in Treatment: 0 Information Obtained From Patient Constitutional Symptoms (General Health) Complaints and Symptoms: Negative for: Fatigue; Fever; Chills; Marked Weight Change Eyes Complaints and Symptoms: Negative for: Dry Eyes; Vision Changes; Glasses / Contacts Ear/Nose/Mouth/Throat Complaints and Symptoms: Negative for: Difficult clearing ears; Sinusitis Medical History: Negative for: Chronic sinus problems/congestion; Middle ear problems Past Medical History Notes: hx of cold sores Hematologic/Lymphatic Complaints and Symptoms: Negative for: Bleeding / Clotting Disorders; Human Immunodeficiency Virus Respiratory Complaints and Symptoms: Negative for: Chronic or frequent coughs; Shortness of Breath Cardiovascular Complaints and Symptoms: Negative for: Chest pain; LE edema Gastrointestinal Complaints and Symptoms: Negative for: Frequent diarrhea; Nausea; Vomiting Endocrine Complaints and Symptoms: Negative for: Hepatitis; Thyroid disease; Polydypsia (Excessive Thirst) Genitourinary Complaints and Symptoms: Negative for: Kidney failure/ Dialysis; Incontinence/dribbling Immunological Complaints and Symptoms: Negative for: Hives; Itching JAIVEN, GRAVELINE ([id number]) Integumentary (Skin) Complaints and Symptoms: Positive for: Wounds Negative for: Bleeding or bruising tendency; Breakdown; Swelling Medical History: Negative for: History of Burn; History of pressure wounds Musculoskeletal Complaints and Symptoms: Negative for: Muscle Pain; Muscle Weakness Neurologic Complaints and Symptoms: Negative for: Numbness/parasthesias; Focal/Weakness Psychiatric Complaints and Symptoms: Negative for: Anxiety; Claustrophobia Oncologic Immunizations Pneumococcal Vaccine: Received Pneumococcal Vaccination: No Implantable Devices None Family and Social History Cancer:  No; Diabetes: No; Heart Disease: Yes - Father,Paternal Grandparents; Hereditary Spherocytosis: No; Hypertension: Yes - Father,Paternal Grandparents; Kidney Disease: No; Lung Disease: Yes - Maternal Grandparents; Seizures: No; Stroke: No; Thyroid Problems: No; Tuberculosis: No; Current every day smoker; Marital Status - Separated; Alcohol Use: Never; Drug Use: No History; Caffeine Use: Daily; Financial Concerns: No; Food, Clothing or Shelter Needs: No; Support System Lacking: No; Transportation Concerns: No Electronic Signature(s) Signed: 08/03/2019 4:05:00 PM By: Montey Hora Signed: 08/04/2019 4:19:26 PM By: Worthy Keeler PA-C Entered By: Montey Hora on 08/03/2019 14:29:32 Corinna Capra ([id number]) -------------------------------------------------------------------------------- SuperBill Details Patient Name: Corinna Capra Date of Service: 08/03/2019 Medical Record Number: [id number] Patient Account Number: [id number] Date of Birth/Sex: 1965-07-23 (54 y.o. 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

## 2019-08-11 ENCOUNTER — Ambulatory Visit: Payer: Self-pay | Admitting: Physician Assistant

## 2020-02-09 ENCOUNTER — Other Ambulatory Visit: Payer: Self-pay

## 2020-02-09 ENCOUNTER — Emergency Department
Admission: EM | Admit: 2020-02-09 | Discharge: 2020-02-09 | Disposition: A | Discharge status: Home / Self Care | Payer: Self-pay | Attending: Emergency Medicine | Admitting: Emergency Medicine

## 2020-02-09 ENCOUNTER — Encounter: Payer: Self-pay | Admitting: Emergency Medicine

## 2020-02-09 DIAGNOSIS — F430 Acute stress reaction: Secondary | ICD-10-CM | POA: Insufficient documentation

## 2020-02-09 DIAGNOSIS — Z79899 Other long term (current) drug therapy: Secondary | ICD-10-CM | POA: Insufficient documentation

## 2020-02-09 DIAGNOSIS — F419 Anxiety disorder, unspecified: Secondary | ICD-10-CM | POA: Insufficient documentation

## 2020-02-09 DIAGNOSIS — F17210 Nicotine dependence, cigarettes, uncomplicated: Secondary | ICD-10-CM | POA: Insufficient documentation

## 2020-02-09 MED ORDER — LORAZEPAM 0.5 MG PO TABS: 0.5000 mg | ORAL_TABLET | Freq: Three times a day (TID) | ORAL | 0 refills | Status: DC | PRN

## 2020-02-09 NOTE — ED Notes (Signed)
Pt reports has panic attacks. First diagnosed here in this ED. Reports does not have a PCP. Reports does not take medication for anxiety.

## 2020-02-09 NOTE — ED Triage Notes (Addendum)
Pt here for panic attacks.  Reports is going through a divorce and he has been living with his mom.  Has not been able to see grand kids for a while because of his wife.  Stressful job.  Has been having them more frequent but does not have a pcp.  The room he stays in is very small and he claustrophobic.

## 2020-02-09 NOTE — ED Provider Notes (Signed)
[facility] Emergency Department Provider Note   ____________________________________________    I have reviewed the triage vital signs and the nursing notes.   HISTORY  Chief Complaint Anxiety     HPI Matthew Tapia is a 54 y.o. male who presents with complaints of stress and anxiety.  Patient reports he is having more frequent episodes of significant claustrophobia, feeling of loss of control feeling short of breath.  He reports that he had a separation with his wife, and his work is particularly stressful now and he suspects that he is having panic attacks.  He denies any SI or HI.  Does not take anything for this.  Has never had medication for this.  Has never had counseling for this.  No chest pain, no shortness of breath, feels quite well now.   History reviewed. No pertinent past medical history.  There are no problems to display for this patient.   Past Surgical History:  Procedure Laterality Date  . SHOULDER SURGERY      Prior to Admission medications   Medication Sig Start Date End Date Taking? Authorizing Provider  albuterol (PROVENTIL HFA;VENTOLIN HFA) 108 (90 Base) MCG/ACT inhaler Inhale 1-2 puffs into the lungs every 6 (six) hours as needed for wheezing or shortness of breath. 07/11/15   Hagler, Jami L, PA-C  albuterol (VENTOLIN HFA) 108 (90 Base) MCG/ACT inhaler Inhale 2 puffs into the lungs every 6 (six) hours as needed for wheezing or shortness of breath. 04/21/19   Willy Eddy, MD  amoxicillin (AMOXIL) 875 MG tablet Take 1 tablet (875 mg total) by mouth 2 (two) times daily. 07/11/15   Hagler, Jami L, PA-C  brompheniramine-pseudoephedrine-DM 30-2-10 MG/5ML syrup Take 10 mLs by mouth 4 (four) times daily as needed. 07/11/15   Hagler, Jami L, PA-C  fluticasone (FLONASE) 50 MCG/ACT nasal spray Place 2 sprays into both nostrils daily. 07/11/15   Hagler, Jami L, PA-C  LORazepam (ATIVAN) 0.5 MG tablet Take 1 tablet (0.5 mg total) by  mouth every 8 (eight) hours as needed for anxiety. 02/09/20 02/08/21  Jene Every, MD  predniSONE (DELTASONE) 10 MG tablet Take 1 tablet (10 mg total) by mouth daily. Day 1-2: Take 50 mg  ( 5 pills) Day 3-4 : Take 40 mg (4pills) Day 5-6: Take 30 mg (3 pills) Day 7-8:  Take 20 mg (2 pills) Day 9:  Take 10mg  (1 pill) 04/21/19   04/23/19, MD  sulfamethoxazole-trimethoprim (BACTRIM DS) 800-160 MG tablet Take 1 tablet by mouth 2 (two) times daily. 07/29/19   09/28/19, PA-C     Allergies Patient has no known allergies.  History reviewed. No pertinent family history.  Social History Social History   Tobacco Use  . Smoking status: Current Every Day Smoker    Types: Cigarettes  . Smokeless tobacco: Never Used  Substance Use Topics  . Alcohol use: No  . Drug use: Never    Review of Systems  Constitutional: No fever/chills Eyes: No visual changes.  ENT: No sore throat. Cardiovascular: Denies chest pain. Respiratory: As above Gastrointestinal: No abdominal pain.  No nausea, no vomiting.   Genitourinary: Negative for dysuria. Musculoskeletal: Negative for back pain. Skin: Negative for rash. Neurological: Negative for headaches    ____________________________________________   PHYSICAL EXAM:  VITAL SIGNS: ED Triage Vitals  Enc Vitals Group     BP 02/09/20 1828 (!) 160/121     Pulse Rate 02/09/20 1828 88     Resp 02/09/20 1828 (!) 22  Temp 02/09/20 1828 98.4 F (36.9 C)     Temp Source 02/09/20 1828 Oral     SpO2 02/09/20 1828 98 %     Weight 02/09/20 1849 87.1 kg (192 lb)     Height 02/09/20 1849 1.676 m (5\' 6" )     Head Circumference --      Peak Flow --      Pain Score 02/09/20 1849 0     Pain Loc --      Pain Edu? --      Excl. in GC? --     Constitutional: Alert and oriented.  Nose: No congestion/rhinnorhea. Mouth/Throat: Mucous membranes are moist.   Neck:  Painless ROM Cardiovascular: Normal rate, regular rhythm. Grossly normal heart sounds.   Good peripheral circulation. Respiratory: Normal respiratory effort.  No retractions. Lungs CTAB. Gastrointestinal: Soft and nontender. No distention.   Musculoskeletal: No lower extremity tenderness nor edema.  Warm and well perfused Neurologic:  Normal speech and language. No gross focal neurologic deficits are appreciated.  Skin:  Skin is warm, dry and intact. No rash noted. Psychiatric: Mood and affect are normal. Speech and behavior are normal.  ____________________________________________   LABS (all labs ordered are listed, but only abnormal results are displayed)  Labs Reviewed - No data to display ____________________________________________  EKG  None ____________________________________________  RADIOLOGY  None ____________________________________________   PROCEDURES  Procedure(s) performed: No  Procedures   Critical Care performed: No ____________________________________________   INITIAL IMPRESSION / ASSESSMENT AND PLAN / ED COURSE  Pertinent labs & imaging results that were available during my care of the patient were reviewed by me and considered in my medical decision making (see chart for details).  Patient well-appearing and in no acute distress.  Asymptomatic at this time, reassuring exam.  No wheezing.  Mild hypertension which is not new.  Discussed stress relief activities with patient, lifestyle changes, counseled smoking cessation.  Will prescribe brief course of Ativan as needed for severe anxiety.  Outpatient follow-up recommended    ____________________________________________   FINAL CLINICAL IMPRESSION(S) / ED DIAGNOSES  Final diagnoses:  Anxiety        Note:  This document was prepared using Dragon voice recognition software and may include unintentional dictation errors.   02/11/20, MD 02/09/20 2025

## 2020-02-09 NOTE — ED Notes (Signed)
ED Provider at bedside. 

## 2020-05-14 ENCOUNTER — Inpatient Hospital Stay
Admission: EM | Admit: 2020-05-14 | Discharge: 2020-05-17 | DRG: 251 | Disposition: A | Discharge status: Other Acute Inpatient Hospital | Payer: Self-pay | Attending: Internal Medicine | Admitting: Internal Medicine

## 2020-05-14 ENCOUNTER — Other Ambulatory Visit: Payer: Self-pay

## 2020-05-14 ENCOUNTER — Encounter
Admission: EM | Disposition: A | Discharge status: Other Acute Inpatient Hospital | Payer: Self-pay | Source: Home / Self Care | Attending: Internal Medicine

## 2020-05-14 ENCOUNTER — Inpatient Hospital Stay: Payer: Self-pay

## 2020-05-14 DIAGNOSIS — Z8249 Family history of ischemic heart disease and other diseases of the circulatory system: Secondary | ICD-10-CM

## 2020-05-14 DIAGNOSIS — I255 Ischemic cardiomyopathy: Secondary | ICD-10-CM

## 2020-05-14 DIAGNOSIS — D72829 Elevated white blood cell count, unspecified: Secondary | ICD-10-CM

## 2020-05-14 DIAGNOSIS — F1490 Cocaine use, unspecified, uncomplicated: Secondary | ICD-10-CM | POA: Diagnosis present

## 2020-05-14 DIAGNOSIS — I2111 ST elevation (STEMI) myocardial infarction involving right coronary artery: Secondary | ICD-10-CM

## 2020-05-14 DIAGNOSIS — Z7982 Long term (current) use of aspirin: Secondary | ICD-10-CM

## 2020-05-14 DIAGNOSIS — Z720 Tobacco use: Secondary | ICD-10-CM | POA: Diagnosis present

## 2020-05-14 DIAGNOSIS — I213 ST elevation (STEMI) myocardial infarction of unspecified site: Secondary | ICD-10-CM

## 2020-05-14 DIAGNOSIS — F17210 Nicotine dependence, cigarettes, uncomplicated: Secondary | ICD-10-CM | POA: Diagnosis present

## 2020-05-14 DIAGNOSIS — F419 Anxiety disorder, unspecified: Secondary | ICD-10-CM

## 2020-05-14 DIAGNOSIS — R059 Cough, unspecified: Secondary | ICD-10-CM | POA: Diagnosis present

## 2020-05-14 DIAGNOSIS — I2119 ST elevation (STEMI) myocardial infarction involving other coronary artery of inferior wall: Principal | ICD-10-CM | POA: Diagnosis present

## 2020-05-14 DIAGNOSIS — I2510 Atherosclerotic heart disease of native coronary artery without angina pectoris: Secondary | ICD-10-CM

## 2020-05-14 DIAGNOSIS — Z20822 Contact with and (suspected) exposure to covid-19: Secondary | ICD-10-CM | POA: Diagnosis present

## 2020-05-14 DIAGNOSIS — Z79899 Other long term (current) drug therapy: Secondary | ICD-10-CM

## 2020-05-14 DIAGNOSIS — Z792 Long term (current) use of antibiotics: Secondary | ICD-10-CM

## 2020-05-14 DIAGNOSIS — I5020 Unspecified systolic (congestive) heart failure: Secondary | ICD-10-CM

## 2020-05-14 DIAGNOSIS — E785 Hyperlipidemia, unspecified: Secondary | ICD-10-CM

## 2020-05-14 HISTORY — DX: Other psychoactive substance abuse, uncomplicated: F19.10

## 2020-05-14 HISTORY — DX: Hyperlipidemia, unspecified: E78.5

## 2020-05-14 HISTORY — DX: Unspecified systolic (congestive) heart failure: I50.20

## 2020-05-14 HISTORY — PX: CORONARY/GRAFT ACUTE MI REVASCULARIZATION: CATH118305

## 2020-05-14 HISTORY — PX: LEFT HEART CATH AND CORONARY ANGIOGRAPHY: CATH118249

## 2020-05-14 HISTORY — DX: Anxiety disorder, unspecified: F41.9

## 2020-05-14 HISTORY — DX: Atherosclerotic heart disease of native coronary artery without angina pectoris: I25.10

## 2020-05-14 HISTORY — DX: ST elevation (STEMI) myocardial infarction involving other coronary artery of inferior wall: I21.19

## 2020-05-14 LAB — RESP PANEL BY RT-PCR (FLU A&B, COVID) ARPGX2
Influenza A by PCR: NEGATIVE
Influenza B by PCR: NEGATIVE
SARS Coronavirus 2 by RT PCR: NEGATIVE

## 2020-05-14 LAB — BRAIN NATRIURETIC PEPTIDE: B Natriuretic Peptide: 534.9 pg/mL — ABNORMAL HIGH (ref 0.0–100.0)

## 2020-05-14 LAB — CBC WITH DIFFERENTIAL/PLATELET
Abs Immature Granulocytes: 0.05 10*3/uL (ref 0.00–0.07)
Basophils Absolute: 0.1 10*3/uL (ref 0.0–0.1)
Basophils Relative: 1 %
Eosinophils Absolute: 0.2 10*3/uL (ref 0.0–0.5)
Eosinophils Relative: 1 %
HCT: 41.7 % (ref 39.0–52.0)
Hemoglobin: 13.7 g/dL (ref 13.0–17.0)
Immature Granulocytes: 0 %
Lymphocytes Relative: 23 %
Lymphs Abs: 3.3 10*3/uL (ref 0.7–4.0)
MCH: 31.2 pg (ref 26.0–34.0)
MCHC: 32.9 g/dL (ref 30.0–36.0)
MCV: 95 fL (ref 80.0–100.0)
Monocytes Absolute: 1 10*3/uL (ref 0.1–1.0)
Monocytes Relative: 7 %
Neutro Abs: 9.7 10*3/uL — ABNORMAL HIGH (ref 1.7–7.7)
Neutrophils Relative %: 68 %
Platelets: 417 10*3/uL — ABNORMAL HIGH (ref 150–400)
RBC: 4.39 MIL/uL (ref 4.22–5.81)
RDW: 12.3 % (ref 11.5–15.5)
WBC: 14.3 10*3/uL — ABNORMAL HIGH (ref 4.0–10.5)
nRBC: 0 % (ref 0.0–0.2)

## 2020-05-14 LAB — BASIC METABOLIC PANEL
Anion gap: 10 (ref 5–15)
BUN: 7 mg/dL (ref 6–20)
CO2: 22 mmol/L (ref 22–32)
Calcium: 9 mg/dL (ref 8.9–10.3)
Chloride: 104 mmol/L (ref 98–111)
Creatinine, Ser: 0.66 mg/dL (ref 0.61–1.24)
GFR, Estimated: >60 mL/min (ref >60)
Glucose, Bld: 138 mg/dL — ABNORMAL HIGH (ref 70–99)
Potassium: 4.1 mmol/L (ref 3.5–5.1)
Sodium: 136 mmol/L (ref 135–145)

## 2020-05-14 LAB — URINE DRUG SCREEN, QUALITATIVE (ARMC ONLY)
Amphetamines, Ur Screen: POSITIVE — AB
Barbiturates, Ur Screen: NOT DETECTED
Benzodiazepine, Ur Scrn: POSITIVE — AB
Cannabinoid 50 Ng, Ur ~~LOC~~: NOT DETECTED
Cocaine Metabolite,Ur ~~LOC~~: POSITIVE — AB
MDMA (Ecstasy)Ur Screen: NOT DETECTED
Methadone Scn, Ur: NOT DETECTED
Opiate, Ur Screen: NOT DETECTED
Phencyclidine (PCP) Ur S: NOT DETECTED
Tricyclic, Ur Screen: NOT DETECTED

## 2020-05-14 LAB — CBC
HCT: 39.5 % (ref 39.0–52.0)
Hemoglobin: 13.2 g/dL (ref 13.0–17.0)
MCH: 31.8 pg (ref 26.0–34.0)
MCHC: 33.4 g/dL (ref 30.0–36.0)
MCV: 95.2 fL (ref 80.0–100.0)
Platelets: 347 10*3/uL (ref 150–400)
RBC: 4.15 MIL/uL — ABNORMAL LOW (ref 4.22–5.81)
RDW: 12.4 % (ref 11.5–15.5)
WBC: 12.2 10*3/uL — ABNORMAL HIGH (ref 4.0–10.5)
nRBC: 0 % (ref 0.0–0.2)

## 2020-05-14 LAB — PROTIME-INR
INR: 1 (ref 0.8–1.2)
Prothrombin Time: 12.7 seconds (ref 11.4–15.2)

## 2020-05-14 LAB — POCT ACTIVATED CLOTTING TIME
Activated Clotting Time: 356 seconds
Activated Clotting Time: 208 seconds

## 2020-05-14 LAB — TROPONIN I (HIGH SENSITIVITY)
Troponin I (High Sensitivity): 1815 ng/L (ref <18)
Troponin I (High Sensitivity): 1989 ng/L (ref <18)

## 2020-05-14 LAB — APTT: aPTT: 31 seconds (ref 24–36)

## 2020-05-14 LAB — CREATININE, SERUM
Creatinine, Ser: 0.69 mg/dL (ref 0.61–1.24)
GFR, Estimated: >60 mL/min (ref >60)

## 2020-05-14 LAB — CARDIAC CATHETERIZATION: Cath EF Quantitative: 30 %

## 2020-05-14 LAB — MRSA PCR SCREENING: MRSA by PCR: NEGATIVE

## 2020-05-14 LAB — GLUCOSE, CAPILLARY: Glucose-Capillary: 92 mg/dL (ref 70–99)

## 2020-05-14 SURGERY — CORONARY/GRAFT ACUTE MI REVASCULARIZATION
Anesthesia: Moderate Sedation

## 2020-05-14 MED ORDER — ALBUTEROL SULFATE HFA 108 (90 BASE) MCG/ACT IN AERS: 2.0000 | INHALATION_SPRAY | Freq: Four times a day (QID) | RESPIRATORY_TRACT | Status: DC | PRN

## 2020-05-14 MED ORDER — TICAGRELOR 90 MG PO TABS
ORAL_TABLET | ORAL | Status: DC | PRN
  Administered 2020-05-14: 180 mg via ORAL

## 2020-05-14 MED ORDER — TIROFIBAN HCL IV 12.5 MG/250 ML
0.1500 ug/kg/min | INTRAVENOUS | Status: DC
  Administered 2020-05-15 – 2020-05-16 (×3): 0.15 ug/kg/min via INTRAVENOUS
  Filled 2020-05-14 (×3): qty 250
  Filled 2020-05-14 (×2): qty 100

## 2020-05-14 MED ORDER — HEPARIN SODIUM (PORCINE) 5000 UNIT/ML IJ SOLN
4000.0000 [IU] | Freq: Once | INTRAMUSCULAR | Status: AC
  Administered 2020-05-14: 4000 [IU] via INTRAVENOUS

## 2020-05-14 MED ORDER — FENTANYL CITRATE (PF) 100 MCG/2ML IJ SOLN
INTRAMUSCULAR | Status: DC | PRN
  Administered 2020-05-14: 50 ug via INTRAVENOUS

## 2020-05-14 MED ORDER — MIDAZOLAM HCL 2 MG/2ML IJ SOLN
INTRAMUSCULAR | Status: AC
  Filled 2020-05-14: qty 2

## 2020-05-14 MED ORDER — HEPARIN SODIUM (PORCINE) 1000 UNIT/ML IJ SOLN
INTRAMUSCULAR | Status: AC
  Filled 2020-05-14: qty 1

## 2020-05-14 MED ORDER — NITROGLYCERIN 0.4 MG SL SUBL: 0.4000 mg | SUBLINGUAL_TABLET | SUBLINGUAL | Status: DC | PRN

## 2020-05-14 MED ORDER — SODIUM CHLORIDE 0.9% FLUSH: 3.0000 mL | INTRAVENOUS | Status: DC | PRN

## 2020-05-14 MED ORDER — SODIUM CHLORIDE 0.9 % IV SOLN: 250.0000 mL | INTRAVENOUS | Status: DC | PRN

## 2020-05-14 MED ORDER — ALBUTEROL SULFATE HFA 108 (90 BASE) MCG/ACT IN AERS
2.0000 | INHALATION_SPRAY | RESPIRATORY_TRACT | Status: DC | PRN
  Filled 2020-05-14: qty 6.7

## 2020-05-14 MED ORDER — SODIUM CHLORIDE 0.9% FLUSH
3.0000 mL | Freq: Two times a day (BID) | INTRAVENOUS | Status: DC
  Administered 2020-05-14 – 2020-05-17 (×4): 3 mL via INTRAVENOUS

## 2020-05-14 MED ORDER — NITROGLYCERIN 1 MG/10 ML FOR IR/CATH LAB
INTRA_ARTERIAL | Status: DC | PRN
  Administered 2020-05-14: 150 ug via INTRACORONARY

## 2020-05-14 MED ORDER — VERAPAMIL HCL 2.5 MG/ML IV SOLN
INTRAVENOUS | Status: DC | PRN
  Administered 2020-05-14: 2.5 mg via INTRA_ARTERIAL

## 2020-05-14 MED ORDER — ALBUTEROL SULFATE HFA 108 (90 BASE) MCG/ACT IN AERS: 1.0000 | INHALATION_SPRAY | Freq: Four times a day (QID) | RESPIRATORY_TRACT | Status: DC | PRN

## 2020-05-14 MED ORDER — HEPARIN (PORCINE) IN NACL 2000-0.9 UNIT/L-% IV SOLN
INTRAVENOUS | Status: DC | PRN
  Administered 2020-05-14: 1000 mL

## 2020-05-14 MED ORDER — LORAZEPAM 1 MG PO TABS
0.5000 mg | ORAL_TABLET | Freq: Three times a day (TID) | ORAL | Status: DC | PRN
  Administered 2020-05-15 – 2020-05-16 (×3): 0.5 mg via ORAL
  Filled 2020-05-14 (×3): qty 1

## 2020-05-14 MED ORDER — NICOTINE 21 MG/24HR TD PT24
21.0000 mg | MEDICATED_PATCH | Freq: Every day | TRANSDERMAL | Status: DC
  Filled 2020-05-14 (×3): qty 1

## 2020-05-14 MED ORDER — ACETAMINOPHEN 325 MG PO TABS: 650.0000 mg | ORAL_TABLET | ORAL | Status: DC | PRN

## 2020-05-14 MED ORDER — TICAGRELOR 90 MG PO TABS
ORAL_TABLET | ORAL | Status: AC
  Filled 2020-05-14: qty 2

## 2020-05-14 MED ORDER — MIDAZOLAM HCL 2 MG/2ML IJ SOLN
INTRAMUSCULAR | Status: DC | PRN
  Administered 2020-05-14: 1 mg via INTRAVENOUS

## 2020-05-14 MED ORDER — ASPIRIN 81 MG PO CHEW
81.0000 mg | CHEWABLE_TABLET | Freq: Every day | ORAL | Status: DC
  Administered 2020-05-14 – 2020-05-17 (×3): 81 mg via ORAL
  Filled 2020-05-14 (×3): qty 1

## 2020-05-14 MED ORDER — LIDOCAINE HCL (PF) 1 % IJ SOLN
INTRAMUSCULAR | Status: DC | PRN
  Administered 2020-05-14: 2 mL

## 2020-05-14 MED ORDER — HEPARIN BOLUS VIA INFUSION
4000.0000 [IU] | Freq: Once | INTRAVENOUS | Status: DC
  Filled 2020-05-14: qty 4000

## 2020-05-14 MED ORDER — TICAGRELOR 90 MG PO TABS
90.0000 mg | ORAL_TABLET | Freq: Two times a day (BID) | ORAL | Status: DC
  Administered 2020-05-14 – 2020-05-15 (×3): 90 mg via ORAL
  Filled 2020-05-14 (×3): qty 1

## 2020-05-14 MED ORDER — TIROFIBAN (AGGRASTAT) BOLUS VIA INFUSION
INTRAVENOUS | Status: DC | PRN
  Administered 2020-05-14: 2155 ug via INTRAVENOUS

## 2020-05-14 MED ORDER — HEPARIN (PORCINE) IN NACL 1000-0.9 UT/500ML-% IV SOLN
INTRAVENOUS | Status: AC
  Filled 2020-05-14: qty 1000

## 2020-05-14 MED ORDER — ATORVASTATIN CALCIUM 20 MG PO TABS
80.0000 mg | ORAL_TABLET | Freq: Every day | ORAL | Status: DC
  Administered 2020-05-14 – 2020-05-17 (×4): 80 mg via ORAL
  Filled 2020-05-14 (×4): qty 4

## 2020-05-14 MED ORDER — TIROFIBAN HCL IV 12.5 MG/250 ML
INTRAVENOUS | Status: AC | PRN
  Administered 2020-05-14: 0.15 ug/kg/min via INTRAVENOUS

## 2020-05-14 MED ORDER — CHLORHEXIDINE GLUCONATE CLOTH 2 % EX PADS
6.0000 | MEDICATED_PAD | Freq: Every day | CUTANEOUS | Status: DC
  Administered 2020-05-14 – 2020-05-17 (×2): 6 via TOPICAL

## 2020-05-14 MED ORDER — FENTANYL CITRATE (PF) 100 MCG/2ML IJ SOLN
INTRAMUSCULAR | Status: AC
  Filled 2020-05-14: qty 2

## 2020-05-14 MED ORDER — MORPHINE SULFATE (PF) 2 MG/ML IV SOLN: 2.0000 mg | INTRAVENOUS | Status: DC | PRN

## 2020-05-14 MED ORDER — HEPARIN SODIUM (PORCINE) 1000 UNIT/ML IJ SOLN
INTRAMUSCULAR | Status: DC | PRN
  Administered 2020-05-14: 5000 [IU] via INTRAVENOUS

## 2020-05-14 MED ORDER — DM-GUAIFENESIN ER 30-600 MG PO TB12
1.0000 | ORAL_TABLET | Freq: Two times a day (BID) | ORAL | Status: DC | PRN
  Administered 2020-05-17: 1 via ORAL
  Filled 2020-05-14: qty 1

## 2020-05-14 MED ORDER — SODIUM CHLORIDE 0.9 % IV SOLN: INTRAVENOUS | Status: AC

## 2020-05-14 MED ORDER — CARVEDILOL 3.125 MG PO TABS
3.1250 mg | ORAL_TABLET | Freq: Two times a day (BID) | ORAL | Status: DC
  Administered 2020-05-14 – 2020-05-16 (×4): 3.125 mg via ORAL
  Filled 2020-05-14 (×4): qty 1

## 2020-05-14 MED ORDER — LIDOCAINE HCL (PF) 1 % IJ SOLN
INTRAMUSCULAR | Status: AC
  Filled 2020-05-14: qty 30

## 2020-05-14 MED ORDER — VERAPAMIL HCL 2.5 MG/ML IV SOLN
INTRAVENOUS | Status: AC
  Filled 2020-05-14: qty 2

## 2020-05-14 MED ORDER — ATROPINE SULFATE 1 MG/10ML IJ SOSY
PREFILLED_SYRINGE | INTRAMUSCULAR | Status: AC
  Filled 2020-05-14: qty 10

## 2020-05-14 MED ORDER — ENOXAPARIN SODIUM 60 MG/0.6ML ~~LOC~~ SOLN
0.5000 mg/kg | SUBCUTANEOUS | Status: DC
  Administered 2020-05-15 – 2020-05-17 (×2): 45 mg via SUBCUTANEOUS
  Filled 2020-05-14 (×3): qty 0.6

## 2020-05-14 MED ORDER — IOHEXOL 300 MG/ML  SOLN
INTRAMUSCULAR | Status: DC | PRN
  Administered 2020-05-14: 220 mL

## 2020-05-14 MED ORDER — ONDANSETRON HCL 4 MG/2ML IJ SOLN: 4.0000 mg | Freq: Four times a day (QID) | INTRAMUSCULAR | Status: DC | PRN

## 2020-05-14 MED ORDER — HEPARIN (PORCINE) 25000 UT/250ML-% IV SOLN: 1100.0000 [IU]/h | INTRAVENOUS | Status: DC

## 2020-05-14 SURGICAL SUPPLY — 15 items
BALLN TREK RX 2.5X15 (BALLOONS) ×2
BALLN TREK RX 2.5X20 (BALLOONS) ×2
BALLOON TREK RX 2.5X15 (BALLOONS) ×1 IMPLANT
BALLOON TREK RX 2.5X20 (BALLOONS) ×1 IMPLANT
CATH EXTRAC PRONTO LP 6F RND (CATHETERS) ×2 IMPLANT
CATH INFINITI 5FR JK (CATHETERS) ×2 IMPLANT
CATH LAUNCHER 6FR JR4 (CATHETERS) ×2 IMPLANT
DEVICE RAD TR BAND REGULAR (VASCULAR PRODUCTS) ×2 IMPLANT
GLIDESHEATH SLEND SS 6F .021 (SHEATH) ×2 IMPLANT
GUIDEWIRE INQWIRE 1.5J.035X260 (WIRE) ×1 IMPLANT
INQWIRE 1.5J .035X260CM (WIRE) ×2
KIT ENCORE 26 ADVANTAGE (KITS) ×2 IMPLANT
KIT MANI 3VAL PERCEP (MISCELLANEOUS) ×2 IMPLANT
PACK CARDIAC CATH (CUSTOM PROCEDURE TRAY) ×2 IMPLANT
WIRE RUNTHROUGH .014X180CM (WIRE) ×2 IMPLANT

## 2020-05-14 NOTE — Progress Notes (Signed)
PHARMACIST - PHYSICIAN COMMUNICATION  CONCERNING:  Enoxaparin (Lovenox) for DVT Prophylaxis    RECOMMENDATION: Patient was prescribed enoxaparin 40mg  q24 hours for VTE prophylaxis.   Filed Weights   05/14/20 1253 05/14/20 1459  Weight: 86.2 kg (190 lb) 91.6 kg (201 lb 15.1 oz)    Body mass index is 32.59 kg/m.  Estimated Creatinine Clearance: 110.5 mL/min (by C-G formula based on SCr of 0.66 mg/dL).   Based on [facility] policy patient is candidate for enoxaparin 0.5mg /kg TBW SQ every 24 hours based on BMI being >30.  DESCRIPTION: Pharmacy has adjusted enoxaparin dose per [facility] policy.  Patient is now receiving enoxaparin 45 mg every 24 hours   [facility] 05/14/2020 3:16 PM

## 2020-05-14 NOTE — Consult Note (Signed)
Cardiology Consultation:   Patient ID: Matthew Tapia MRN: [id number]; DOB: Feb 08, 1966  Admit date: 05/14/2020 Date of Consult: 05/14/2020  Primary Care Provider: Patient, No Pcp Per [facility] Cardiologist: new ( Dr. Kirke Corin) [facility] Electrophysiologist:  None    Patient Profile:   Matthew Tapia is a 54 y.o. male with a hx of anxiety and tobacco use who is being seen today for the evaluation of inferior ST elevation myocardial infarction at the request of Dr. Larinda Buttery.  History of Present Illness:   Matthew Tapia is a 54 year old gentleman with no previous cardiac history.  He has known history of anxiety and tobacco use.  He reports having intermittent chest pain for the last few weeks described as tightness feeling with associated shortness of breath.  He thought these episodes were due to his anxiety and did not seek medical attention.  However, he started having worsening shortness of breath and in addition his chest pain worsened yesterday and today.  His mom thought that he looked pale today and was having difficulty breathing and thus she called EMS.  EMS arrived and did an EKG which showed inferior Q waves with inferior ST elevation and reciprocal changes in the anterior leads.  The patient was brought to the emergency department.  He was complaining of 5 out of 10 chest pain with shortness of breath.  His vital signs were stable.  As he was given aspirin and heparin.  He reported improvement in chest pain.  Given his symptoms and EKG changes I recommended proceeding with emergent left heart catheterization possible PCI keeping in mind that this is a likely late presenting myocardial infarction given his symptoms and EKG findings.   History reviewed. No pertinent past medical history.  Past Surgical History:  Procedure Laterality Date  . SHOULDER SURGERY       Home Medications:  Prior to Admission medications   Medication Sig Start Date End Date Taking? Authorizing  Provider  albuterol (PROVENTIL HFA;VENTOLIN HFA) 108 (90 Base) MCG/ACT inhaler Inhale 1-2 puffs into the lungs every 6 (six) hours as needed for wheezing or shortness of breath. 07/11/15   Hagler, Jami L, PA-C  albuterol (VENTOLIN HFA) 108 (90 Base) MCG/ACT inhaler Inhale 2 puffs into the lungs every 6 (six) hours as needed for wheezing or shortness of breath. 04/21/19   Willy Eddy, MD  amoxicillin (AMOXIL) 875 MG tablet Take 1 tablet (875 mg total) by mouth 2 (two) times daily. 07/11/15   Hagler, Jami L, PA-C  brompheniramine-pseudoephedrine-DM 30-2-10 MG/5ML syrup Take 10 mLs by mouth 4 (four) times daily as needed. 07/11/15   Hagler, Jami L, PA-C  fluticasone (FLONASE) 50 MCG/ACT nasal spray Place 2 sprays into both nostrils daily. 07/11/15   Hagler, Jami L, PA-C  LORazepam (ATIVAN) 0.5 MG tablet Take 1 tablet (0.5 mg total) by mouth every 8 (eight) hours as needed for anxiety. 02/09/20 02/08/21  Jene Every, MD  predniSONE (DELTASONE) 10 MG tablet Take 1 tablet (10 mg total) by mouth daily. Day 1-2: Take 50 mg  ( 5 pills) Day 3-4 : Take 40 mg (4pills) Day 5-6: Take 30 mg (3 pills) Day 7-8:  Take 20 mg (2 pills) Day 9:  Take 10mg  (1 pill) 04/21/19   04/23/19, MD  sulfamethoxazole-trimethoprim (BACTRIM DS) 800-160 MG tablet Take 1 tablet by mouth 2 (two) times daily. 07/29/19   09/28/19, PA-C    Inpatient Medications: Scheduled Meds: . aspirin  81 mg Oral Daily  . atorvastatin  80 mg Oral Daily  . carvedilol  3.125 mg Oral BID WC  . Chlorhexidine Gluconate Cloth  6 each Topical Daily  . [START ON 05/15/2020] enoxaparin (LOVENOX) injection  0.5 mg/kg Subcutaneous Q24H  . nicotine  21 mg Transdermal Daily  . sodium chloride flush  3 mL Intravenous Q12H  . ticagrelor  90 mg Oral BID   Continuous Infusions: . sodium chloride    . sodium chloride     PRN Meds: sodium chloride, acetaminophen, albuterol, dextromethorphan-guaiFENesin, LORazepam, ondansetron (ZOFRAN) IV, sodium  chloride flush  Allergies:   No Known Allergies  Social History:   Social History   Socioeconomic History  . Marital status: Married    Spouse name: Not on file  . Number of children: Not on file  . Years of education: Not on file  . Highest education level: Not on file  Occupational History  . Not on file  Tobacco Use  . Smoking status: Current Every Day Smoker    Types: Cigarettes  . Smokeless tobacco: Never Used  Substance and Sexual Activity  . Alcohol use: No  . Drug use: Never  . Sexual activity: Not on file  Other Topics Concern  . Not on file  Social History Narrative  . Not on file   Social Determinants of Health   Financial Resource Strain: Not on file  Food Insecurity: Not on file  Transportation Needs: Not on file  Physical Activity: Not on file  Stress: Not on file  Social Connections: Not on file  Intimate Partner Violence: Not on file    Family History:   Family history is remarkable for coronary artery disease.  The patient does not know the details.  ROS:  Please see the history of present illness.   All other ROS reviewed and negative.     Physical Exam/Data:   Vitals:   05/14/20 1250 05/14/20 1253 05/14/20 1459  BP:   117/84  Pulse:   82  Resp:   (!) 22  Temp:   (!) 96.5 F (35.8 C)  TempSrc:   Axillary  SpO2: 96%  100%  Weight:  86.2 kg 91.6 kg  Height:  5\' 6"  (1.676 m) 5\' 6"  (1.676 m)   No intake or output data in the 24 hours ending 05/14/20 1518 Last 3 Weights 05/14/2020 05/14/2020 02/09/2020  Weight (lbs) 201 lb 15.1 oz 190 lb 192 lb  Weight (kg) 91.6 kg 86.183 kg 87.091 kg     Body mass index is 32.59 kg/m.  General:  Well nourished, well developed, in no acute distress HEENT: normal Lymph: no adenopathy Neck: no JVD Endocrine:  No thryomegaly Vascular: No carotid bruits; FA pulses 2+ bilaterally without bruits  Cardiac:  normal S1, S2; RRR; no murmur  Lungs:  clear to auscultation bilaterally, no wheezing, rhonchi or  rales  Abd: soft, nontender, no hepatomegaly  Ext: no edema Musculoskeletal:  No deformities, BUE and BLE strength normal and equal Skin: warm and dry  Neuro:  CNs 2-12 intact, no focal abnormalities noted Psych:  Normal affect   EKG:  The EKG was personally reviewed and demonstrates: Normal sinus rhythm with 2 mm of anterior ST elevation with reciprocal changes in the anterior leads.  Inferior Q waves are present.  Telemetry:  Telemetry was personally reviewed and demonstrates:    Relevant CV Studies: Echocardiogram pending.  Laboratory Data:  High Sensitivity Troponin:   Recent Labs  Lab 05/14/20 1256  TROPONINIHS 1,815*     Chemistry Recent Labs  Lab 05/14/20 1256  NA 136  K 4.1  CL 104  CO2 22  GLUCOSE 138*  BUN 7  CREATININE 0.66  CALCIUM 9.0  GFRNONAA >60  ANIONGAP 10    No results for input(s): PROT, ALBUMIN, AST, ALT, ALKPHOS, BILITOT in the last 168 hours. Hematology Recent Labs  Lab 05/14/20 1256  WBC 14.3*  RBC 4.39  HGB 13.7  HCT 41.7  MCV 95.0  MCH 31.2  MCHC 32.9  RDW 12.3  PLT 417*   BNP Recent Labs  Lab 05/14/20 1249  BNP 534.9*    DDimer No results for input(s): DDIMER in the last 168 hours.   Radiology/Studies:  CARDIAC CATHETERIZATION  Result Date: 05/14/2020  There is moderate left ventricular systolic dysfunction.  LV end diastolic pressure is severely elevated.  Prox RCA to Dist RCA lesion is 100% stenosed.  Post intervention, there is a 80% residual stenosis.  Balloon angioplasty was performed using a BALLOON TREK RX 2.5X20.  Mid Cx to Dist Cx lesion is 95% stenosed.  2nd Mrg lesion is 80% stenosed.  Prox LAD to Mid LAD lesion is 30% stenosed.  Mid LAD to Dist LAD lesion is 40% stenosed.  1.  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  In  addition, there is high-grade stenosis in the mid left circumflex at the bifurcation of OM 2.  The LAD has mild nonobstructive disease. 2.  Moderately reduced LV systolic function with an EF of 30 to 35% and severely elevated left ventricular end-diastolic pressure. 3.  Aspiration thrombectomy and balloon angioplasty was done to the right coronary artery with partial success.  I was able to establish TIMI II flow but not able to clear all the thrombus. Recommendations: Continue Aggrastat infusion for 48 hours. Continue dual antiplatelet therapy with aspirin and ticagrelor. The plan is to relook angiography on Thursday to see if the right coronary artery is salvageable and also to perform a staged PCI on the left circumflex. I started small dose carvedilol for cardiomyopathy. Diuresis as needed. High-dose atorvastatin. Smoking cessation is advised.     Assessment and Plan:   1. Late presenting inferior ST elevation myocardial infarction: Emergent cardiac catheterization was performed via the right radial artery which showed occluded right coronary artery with left-to-right collaterals and high-grade stenosis of the mid left circumflex.  The RCA was full of thrombus from the proximal to the distal segment.  I performed aspiration thrombectomy and balloon angioplasty and was able to establish TIMI II flow.  However, was not able to clear all of thrombus and I felt that placing multiple overlapping stents will be too risky with risk of distal embolization and shutting down the whole distal vessel.  I felt that it would be better to continue the patient on Aggrastat infusion for 48 hours to see if we can improve the thrombus situation and perform relook angiography on Thursday.  The plan is to do staged left circumflex PCI on Thursday also.  Continue dual antiplatelet therapy with aspirin and ticagrelor.  I started high-dose atorvastatin. 2. Ischemic cardiomyopathy due to late presenting myocardial infarction.  EF  was 30% by left ventricular angiography.  I requested an echocardiogram.  LVEDP was significantly elevated and will diuresis as needed based on his clinical needs.  I started small dose carvedilol.  Will initiate a small dose ACE inhibitor/ARB if blood  pressure tolerates. 3. Tobacco use: I discussed the importance of smoking cessation 4. Hyperlipidemia: Started high-dose atorvastatin. 5. I updated the patient's family about his condition.   TIMI Risk Score for ST  Elevation MI:   The patient's TIMI risk score is 4, which indicates a 7.3% risk of all cause mortality at 30 days.    For questions or updates, please contact [facility] Please consult [email or web address] for contact info under    Signed, Lorine Bears, MD  05/14/2020 3:18 PM

## 2020-05-14 NOTE — Consult Note (Signed)
ANTICOAGULATION CONSULT NOTE  Pharmacy Consult for Heparin Indication: chest pain/ACS  No Known Allergies  Patient Measurements: Height: 5\' 6"  (167.6 cm) Weight: 86.2 kg (190 lb) IBW/kg (Calculated) : 63.8 Heparin Dosing Weight: 81 kg  Vital Signs:    Labs: No results for input(s): HGB, HCT, PLT, APTT, LABPROT, INR, HEPARINUNFRC, HEPRLOWMOCWT, CREATININE, CKTOTAL, CKMB, TROPONINIHS in the last 72 hours.  CrCl cannot be calculated (Patient's most recent lab result is older than the maximum 21 days allowed.).   Medical History: History reviewed. No pertinent past medical history.  Medications:  (Not in a hospital admission)  Scheduled:  Infusions:  PRN:  Anti-infectives (From admission, onward)   None      Assessment: Pharmacy consulted to start heparin for ACS. No note of DOAC PTA. Baseline labs ordered.   Goal of Therapy:  Heparin level 0.3-0.7 units/ml Monitor platelets by anticoagulation protocol: Yes   Plan:  Give 4000 units bolus x 1 Start heparin infusion at 1100 units/hr Check anti-Xa level in 6 hours and daily while on heparin Continue to monitor H&H and platelets  , PharmD, 05/14/2020,12:56 PM

## 2020-05-14 NOTE — ED Notes (Signed)
Patient transported to cath lab on EMS stretcher with MD and RN escort.

## 2020-05-14 NOTE — ED Provider Notes (Addendum)
[facility] Emergency Department Provider Note   ____________________________________________   Event Date/Time   First MD Initiated Contact with Patient 05/14/20 1253     (approximate)  I have reviewed the triage vital signs and the nursing notes.   HISTORY  Chief Complaint Code STEMI    HPI Matthew Tapia is a 54 y.o. male with past medical history of anxiety who presents to the ED complaining of chest pain. Patient reports that last night he developed pressure in the center of his chest while at rest. Symptoms seem to ease up over the course of the night, but then came back this morning. He initially had 7 out of 10 pressure in the center of his chest and EMS was called. They gave 324 mg of aspirin as well as 1 spray of nitroglycerin. Pain improved to 4 out of 10, but initial EKG was concerning for inferior STEMI. Code STEMI was called prehospital and Dr. Kirke Corin of cardiology at bedside upon patient arrival. Patient denies any recent fever, cough, or shortness of breath.        History reviewed. No pertinent past medical history.  There are no problems to display for this patient.   Past Surgical History:  Procedure Laterality Date  . SHOULDER SURGERY      Prior to Admission medications   Medication Sig Start Date End Date Taking? Authorizing Provider  albuterol (PROVENTIL HFA;VENTOLIN HFA) 108 (90 Base) MCG/ACT inhaler Inhale 1-2 puffs into the lungs every 6 (six) hours as needed for wheezing or shortness of breath. 07/11/15   Hagler, Jami L, PA-C  albuterol (VENTOLIN HFA) 108 (90 Base) MCG/ACT inhaler Inhale 2 puffs into the lungs every 6 (six) hours as needed for wheezing or shortness of breath. 04/21/19   Willy Eddy, MD  amoxicillin (AMOXIL) 875 MG tablet Take 1 tablet (875 mg total) by mouth 2 (two) times daily. 07/11/15   Hagler, Jami L, PA-C  brompheniramine-pseudoephedrine-DM 30-2-10 MG/5ML syrup Take 10 mLs by mouth 4 (four) times  daily as needed. 07/11/15   Hagler, Jami L, PA-C  fluticasone (FLONASE) 50 MCG/ACT nasal spray Place 2 sprays into both nostrils daily. 07/11/15   Hagler, Jami L, PA-C  LORazepam (ATIVAN) 0.5 MG tablet Take 1 tablet (0.5 mg total) by mouth every 8 (eight) hours as needed for anxiety. 02/09/20 02/08/21  Jene Every, MD  predniSONE (DELTASONE) 10 MG tablet Take 1 tablet (10 mg total) by mouth daily. Day 1-2: Take 50 mg  ( 5 pills) Day 3-4 : Take 40 mg (4pills) Day 5-6: Take 30 mg (3 pills) Day 7-8:  Take 20 mg (2 pills) Day 9:  Take 10mg  (1 pill) 04/21/19   04/23/19, MD  sulfamethoxazole-trimethoprim (BACTRIM DS) 800-160 MG tablet Take 1 tablet by mouth 2 (two) times daily. 07/29/19   09/28/19, PA-C    Allergies Patient has no known allergies.  History reviewed. No pertinent family history.  Social History Social History   Tobacco Use  . Smoking status: Current Every Day Smoker    Types: Cigarettes  . Smokeless tobacco: Never Used  Substance Use Topics  . Alcohol use: No  . Drug use: Never    Review of Systems  Constitutional: No fever/chills Eyes: No visual changes. ENT: No sore throat. Cardiovascular: Positive for chest pain. Respiratory: Denies shortness of breath. Gastrointestinal: No abdominal pain.  No nausea, no vomiting.  No diarrhea.  No constipation. Genitourinary: Negative for dysuria. Musculoskeletal: Negative for back pain. Skin: Negative for  rash. Neurological: Negative for headaches, focal weakness or numbness.  ____________________________________________   PHYSICAL EXAM:  VITAL SIGNS: ED Triage Vitals [05/14/20 1250]  Enc Vitals Group     BP      Pulse      Resp      Temp      Temp src      SpO2 96 %     Weight      Height      Head Circumference      Peak Flow      Pain Score      Pain Loc      Pain Edu?      Excl. in GC?     Constitutional: Alert and oriented. Eyes: Conjunctivae are normal. Head: Atraumatic. Nose: No  congestion/rhinnorhea. Mouth/Throat: Mucous membranes are moist. Neck: Normal ROM Cardiovascular: Normal rate, regular rhythm. Grossly normal heart sounds. 2+ radial and DP pulses bilaterally. Respiratory: Normal respiratory effort.  No retractions. Lungs CTAB. Gastrointestinal: Soft and nontender. No distention. Genitourinary: deferred Musculoskeletal: No lower extremity tenderness nor edema. Neurologic:  Normal speech and language. No gross focal neurologic deficits are appreciated. Skin:  Skin is warm, dry and intact. No rash noted. Psychiatric: Mood and affect are normal. Speech and behavior are normal.  ____________________________________________   LABS (all labs ordered are listed, but only abnormal results are displayed)  Labs Reviewed  RESP PANEL BY RT-PCR (FLU A&B, COVID) ARPGX2  CBC WITH DIFFERENTIAL/PLATELET  BASIC METABOLIC PANEL  BRAIN NATRIURETIC PEPTIDE  APTT  PROTIME-INR  TROPONIN I (HIGH SENSITIVITY)   ____________________________________________  EKG  Prehospital EKG reviewed and shows inferior ST elevation with reciprocal changes in anterolateral leads.  PROCEDURES  Procedure(s) performed (including Critical Care):  .Critical Care Performed by: Chesley Noon, MD Authorized by: Chesley Noon, MD   Critical care provider statement:    Critical care time (minutes):  13   Critical care time was exclusive of:  Separately billable procedures and treating other patients and teaching time   Critical care was necessary to treat or prevent imminent or life-threatening deterioration of the following conditions:  Cardiac failure   Critical care was time spent personally by me on the following activities:  Discussions with consultants, evaluation of patient's response to treatment, examination of patient, ordering and performing treatments and interventions, ordering and review of laboratory studies, ordering and review of radiographic studies, pulse oximetry,  re-evaluation of patient's condition, obtaining history from patient or surrogate and review of old charts   I assumed direction of critical care for this patient from another provider in my specialty: no       ____________________________________________   INITIAL IMPRESSION / ASSESSMENT AND PLAN / ED COURSE       54 year old male with past medical history of anxiety who presents to the ED complaining of chest pain waxing and waning since last night. Prehospital EKG concerning for inferior STEMI and code STEMI called prior to arrival. Dr. Kirke Corin at bedside upon patient's arrival, he endorses ongoing chest pain and given findings on prehospital EKG patient to be taken to the Cath Lab. Orders placed for 4000 units of heparin, patient transferred to Cath Lab in stable condition.      ____________________________________________   FINAL CLINICAL IMPRESSION(S) / ED DIAGNOSES  Final diagnoses:  ST elevation myocardial infarction (STEMI), unspecified artery [facility])     ED Discharge Orders    None       Note:  This document was prepared using Dragon voice recognition software  and may include unintentional dictation errors.   Chesley Noon, MD 05/14/20 1301    Chesley Noon, MD 05/14/20 [phone]

## 2020-05-14 NOTE — H&P (Signed)
History and Physical    Matthew Tapia ZOX:[id number] DOB: 07-06-1965 DOA: 05/14/2020  Referring MD/NP/PA:   PCP: Patient, No Pcp Per   Patient coming from:  The patient is coming from home.  At baseline, pt is independent for most of ADL.        Chief Complaint: chest pain  HPI: Matthew Tapia is a 54 y.o. male with medical history significant of anxiety, tobacco abuse who presents with chest pain.  Patient states that he has been having intermittent mild chest pain for several weeks, which has worsened since last night. The chest pain becomes more persistent and more severe. It is located in the substernal area, initially 7 out of 10 severity, pressure-like, nonradiating. Patient has mild shortness breath. He has cough with clear mucus production, no fever or chills. Patient states that he had diarrhea in the past 3 days, which has resolved today. No nausea, vomiting, abdominal pain. No symptoms of UTI. Per EMS, EKG showed inferior Q waves with inferior ST elevation and reciprocal changes in the anterior leads. Pt underwent urgent cardiac cath by Dr. Kirke Corin  Cardiac cath:  There is moderate left ventricular systolic dysfunction.  LV end diastolic pressure is severely elevated.  Prox RCA to Dist RCA lesion is 100% stenosed.  Post intervention, there is a 80% residual stenosis.  Balloon angioplasty was performed using a BALLOON TREK RX 2.5X20.  Mid Cx to Dist Cx lesion is 95% stenosed.  2nd Mrg lesion is 80% stenosed.  Prox LAD to Mid LAD lesion is 30% stenosed.  Mid LAD to Dist LAD lesion is 40% stenosed.   1.  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  In addition, there is high-grade stenosis in the mid left circumflex at the bifurcation of OM 2.  The LAD has mild nonobstructive disease. 2.   Moderately reduced LV systolic function with an EF of 30 to 35% and severely elevated left ventricular end-diastolic pressure. 3.  Aspiration thrombectomy and balloon angioplasty was done to the right coronary artery with partial success.  I was able to establish TIMI II flow but not able to clear all the thrombus.     ED Course: pt was found to have troponin 1815, BNP 534, INR 1.0, PTT 31, negative Covid PCR, electrolytes renal function okay, temperature normal, blood pressure 117/84, heart rate 82, RR 22, oxygen saturation 100% on room air. Patient is admitted to stepdown as inpatient. Dr. Kirke Corin of cardiology is on board.  Review of Systems:   General: no fevers, chills, no body weight gain, has fatigue HEENT: no blurry vision, hearing changes or sore throat Respiratory: has dyspnea, coughing, no wheezing CV: has chest pain, no palpitations GI: no nausea, vomiting, abdominal pain, diarrhea, constipation GU: no dysuria, burning on urination, increased urinary frequency, hematuria  Ext: no leg edema Neuro: no unilateral weakness, numbness, or tingling, no vision change or hearing loss Skin: no rash, no skin tear. MSK: No muscle spasm, no deformity, no limitation of range of movement in spin Heme: No easy bruising.  Travel history: No recent long distant travel.  Allergy: No Known Allergies  Past Medical History:  Diagnosis Date  . Anxiety   . Tobacco abuse     Past Surgical History:  Procedure Laterality Date  . SHOULDER SURGERY      Social History:  reports that  he has been smoking cigarettes. He has never used smokeless tobacco. He reports that he does not drink alcohol and does not use drugs.  Family History:  Family History  Problem Relation Age of Onset  . Heart attack Father      Prior to Admission medications   Medication Sig Start Date End Date Taking? Authorizing Provider  albuterol (PROVENTIL HFA;VENTOLIN HFA) 108 (90 Base) MCG/ACT inhaler Inhale 1-2 puffs  into the lungs every 6 (six) hours as needed for wheezing or shortness of breath. 07/11/15   Hagler, Jami L, PA-C  albuterol (VENTOLIN HFA) 108 (90 Base) MCG/ACT inhaler Inhale 2 puffs into the lungs every 6 (six) hours as needed for wheezing or shortness of breath. 04/21/19   Willy Eddy, MD  amoxicillin (AMOXIL) 875 MG tablet Take 1 tablet (875 mg total) by mouth 2 (two) times daily. 07/11/15   Hagler, Jami L, PA-C  brompheniramine-pseudoephedrine-DM 30-2-10 MG/5ML syrup Take 10 mLs by mouth 4 (four) times daily as needed. 07/11/15   Hagler, Jami L, PA-C  fluticasone (FLONASE) 50 MCG/ACT nasal spray Place 2 sprays into both nostrils daily. 07/11/15   Hagler, Jami L, PA-C  LORazepam (ATIVAN) 0.5 MG tablet Take 1 tablet (0.5 mg total) by mouth every 8 (eight) hours as needed for anxiety. 02/09/20 02/08/21  Jene Every, MD  predniSONE (DELTASONE) 10 MG tablet Take 1 tablet (10 mg total) by mouth daily. Day 1-2: Take 50 mg  ( 5 pills) Day 3-4 : Take 40 mg (4pills) Day 5-6: Take 30 mg (3 pills) Day 7-8:  Take 20 mg (2 pills) Day 9:  Take 10mg  (1 pill) 04/21/19   04/23/19, MD  sulfamethoxazole-trimethoprim (BACTRIM DS) 800-160 MG tablet Take 1 tablet by mouth 2 (two) times daily. 07/29/19   09/28/19, PA-C    Physical Exam: Vitals:   05/14/20 1459 05/14/20 1500 05/14/20 1600 05/14/20 1700  BP: 117/84 126/87 121/89 (!) 136/95  Pulse: 82 80 81 78  Resp: (!) 22 20 (!) 23 (!) 24  Temp: (!) 96.5 F (35.8 C)     TempSrc: Axillary     SpO2: 100% 100% 100% 100%  Weight: 91.6 kg     Height: 5\' 6"  (1.676 m)      General: Not in acute distress HEENT:       Eyes: PERRL, EOMI, no scleral icterus.       ENT: No discharge from the ears and nose, no pharynx injection, no tonsillar enlargement.        Neck: No JVD, no bruit, no mass felt. Heme: No neck lymph node enlargement. Cardiac: S1/S2, RRR, No murmurs, No gallops or rubs. Respiratory: No rales, wheezing, rhonchi or rubs. GI: Soft,  nondistended, nontender, no rebound pain, no organomegaly, BS present. GU: No hematuria Ext: No pitting leg edema bilaterally. 2+DP/PT pulse bilaterally. Musculoskeletal: No joint deformities, No joint redness or warmth, no limitation of ROM in spin. Skin: No rashes.  Neuro: Alert, oriented X3, cranial nerves II-XII grossly intact, moves all extremities normally.  Psych: Patient is not psychotic, no suicidal or hemocidal ideation.  Labs on Admission: I have personally reviewed following labs and imaging studies  CBC: Recent Labs  Lab 05/14/20 1256 05/14/20 1510  WBC 14.3* 12.2*  NEUTROABS 9.7*  --   HGB 13.7 13.2  HCT 41.7 39.5  MCV 95.0 95.2  PLT 417* 347   Basic Metabolic Panel: Recent Labs  Lab 05/14/20 1256 05/14/20 1510  NA 136  --   K 4.1  --  CL 104  --   CO2 22  --   GLUCOSE 138*  --   BUN 7  --   CREATININE 0.66 0.69  CALCIUM 9.0  --    GFR: Estimated Creatinine Clearance: 110.5 mL/min (by C-G formula based on SCr of 0.69 mg/dL). Liver Function Tests: No results for input(s): AST, ALT, ALKPHOS, BILITOT, PROT, ALBUMIN in the last 168 hours. No results for input(s): LIPASE, AMYLASE in the last 168 hours. No results for input(s): AMMONIA in the last 168 hours. Coagulation Profile: Recent Labs  Lab 05/14/20 1249  INR 1.0   Cardiac Enzymes: No results for input(s): CKTOTAL, CKMB, CKMBINDEX, TROPONINI in the last 168 hours. BNP (last 3 results) No results for input(s): PROBNP in the last 8760 hours. HbA1C: No results for input(s): HGBA1C in the last 72 hours. CBG: Recent Labs  Lab 05/14/20 1503  GLUCAP 92   Lipid Profile: No results for input(s): CHOL, HDL, LDLCALC, TRIG, CHOLHDL, LDLDIRECT in the last 72 hours. Thyroid Function Tests: No results for input(s): TSH, T4TOTAL, FREET4, T3FREE, THYROIDAB in the last 72 hours. Anemia Panel: No results for input(s): VITAMINB12, FOLATE, FERRITIN, TIBC, IRON, RETICCTPCT in the last 72 hours. Urine  analysis:    Component Value Date/Time   COLORURINE AMBER (A) 01/16/2018 1907   APPEARANCEUR CLEAR (A) 01/16/2018 1907   LABSPEC 1.036 (H) 01/16/2018 1907   PHURINE 5.0 01/16/2018 1907   GLUCOSEU NEGATIVE 01/16/2018 1907   HGBUR NEGATIVE 01/16/2018 1907   BILIRUBINUR NEGATIVE 01/16/2018 1907   KETONESUR 5 (A) 01/16/2018 1907   PROTEINUR 30 (A) 01/16/2018 1907   NITRITE NEGATIVE 01/16/2018 1907   LEUKOCYTESUR NEGATIVE 01/16/2018 1907   Sepsis Labs: @LABRCNTIP (procalcitonin:4,lacticidven:4) ) Recent Results (from the past 240 hour(s))  Resp Panel by RT-PCR (Flu A&B, Covid) Nasopharyngeal Swab     Status: None   Collection Time: 05/14/20 12:49 PM   Specimen: Nasopharyngeal Swab; Nasopharyngeal(NP) swabs in vial transport medium  Result Value Ref Range Status   SARS Coronavirus 2 by RT PCR NEGATIVE NEGATIVE Final    Comment: (NOTE) SARS-CoV-2 target nucleic acids are NOT DETECTED.  The SARS-CoV-2 RNA is generally detectable in upper respiratory specimens during the acute phase of infection. The lowest concentration of SARS-CoV-2 viral copies this assay can detect is 138 copies/mL. A negative result does not preclude SARS-Cov-2 infection and should not be used as the sole basis for treatment or other patient management decisions. A negative result may occur with  improper specimen collection/handling, submission of specimen other than nasopharyngeal swab, presence of viral mutation(s) within the areas targeted by this assay, and inadequate number of viral copies(<138 copies/mL). A negative result must be combined with clinical observations, patient history, and epidemiological information. The expected result is Negative.  Fact Sheet for Patients:  05/16/20  Fact Sheet for Healthcare Providers:  BloggerCourse.com  This test is no t yet approved or cleared by the [place] FDA and  has been authorized for detection  and/or diagnosis of SARS-CoV-2 by FDA under an Emergency Use Authorization (EUA). This EUA will remain  in effect (meaning this test can be used) for the duration of the COVID-19 declaration under Section 564(b)(1) of the Act, 21 U.S.C.section 360bbb-3(b)(1), unless the authorization is terminated  or revoked sooner.       Influenza A by PCR NEGATIVE NEGATIVE Final   Influenza B by PCR NEGATIVE NEGATIVE Final    Comment: (NOTE) The Xpert Xpress SARS-CoV-2/FLU/RSV plus assay is intended as an aid in the diagnosis of  influenza from Nasopharyngeal swab specimens and should not be used as a sole basis for treatment. Nasal washings and aspirates are unacceptable for Xpert Xpress SARS-CoV-2/FLU/RSV testing.  Fact Sheet for Patients: BloggerCourse.comhttps://www.fda.gov/media/152166/download  Fact Sheet for Healthcare Providers: SeriousBroker.ithttps://www.fda.gov/media/152162/download  This test is not yet approved or cleared by the [place] FDA and has been authorized for detection and/or diagnosis of SARS-CoV-2 by FDA under an Emergency Use Authorization (EUA). This EUA will remain in effect (meaning this test can be used) for the duration of the COVID-19 declaration under Section 564(b)(1) of the Act, 21 U.S.C. section 360bbb-3(b)(1), unless the authorization is terminated or revoked.  Performed at [facility], [address] [place], [place] [address]   MRSA PCR Screening     Status: None   Collection Time: 05/14/20  3:02 PM   Specimen: Nasopharyngeal  Result Value Ref Range Status   MRSA by PCR NEGATIVE NEGATIVE Final    Comment:        The GeneXpert MRSA Assay (FDA approved for NASAL specimens only), is one component of a comprehensive MRSA colonization surveillance program. It is not intended to diagnose MRSA infection nor to guide or monitor treatment for MRSA infections. Performed at [facility], [address] [place], [place] [address]      Radiological Exams on  Admission: CARDIAC CATHETERIZATION  Result Date: 05/14/2020  There is moderate left ventricular systolic dysfunction.  LV end diastolic pressure is severely elevated.  Prox RCA to Dist RCA lesion is 100% stenosed.  Post intervention, there is a 80% residual stenosis.  Balloon angioplasty was performed using a BALLOON TREK RX 2.5X20.  Mid Cx to Dist Cx lesion is 95% stenosed.  2nd Mrg lesion is 80% stenosed.  Prox LAD to Mid LAD lesion is 30% stenosed.  Mid LAD to Dist LAD lesion is 40% stenosed.  1.  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  In addition, there is high-grade stenosis in the mid left circumflex at the bifurcation of OM 2.  The LAD has mild nonobstructive disease. 2.  Moderately reduced LV systolic function with an EF of 30 to 35% and severely elevated left ventricular end-diastolic pressure. 3.  Aspiration thrombectomy and balloon angioplasty was done to the right coronary artery with partial success.  I was able to establish TIMI II flow but not able to clear all the thrombus. Recommendations: Continue Aggrastat infusion for 48 hours. Continue dual antiplatelet therapy with aspirin and ticagrelor. The plan is to relook angiography on Thursday to see if the right coronary artery is salvageable and also to perform a staged PCI on the left circumflex. I started small dose carvedilol for cardiomyopathy. Diuresis as needed. High-dose atorvastatin. Smoking cessation is advised.     EKG: per EMS, EKG showed inferior Q waves with inferior ST elevation and reciprocal changes in the anterior leads.   Assessment/Plan Principal Problem:   Acute ST elevation myocardial infarction (STEMI) of inferior wall (HCC) Active Problems:   Tobacco abuse   Leukocytosis   Anxiety   Cough   Acute ST elevation  myocardial infarction (STEMI) of inferior wall (HCC): trop 1815. Pt underwent urgent cardiac catheterization by Dr. Kirke CorinArida.  -admit to SDU as inpt. -follow Dr. Jari SportsmanArida's recommendations as below:  Continue Aggrastat infusion for 48 hours.  Continue dual antiplatelet therapy with aspirin and ticagrelor.  The plan is  to relook angiography on Thursday to see if the right coronary artery is salvageable and also to perform a staged PCI on the left circumflex.  started small dose carvedilol for cardiomyopathy.  Diuresis as needed.  High-dose atorvastatin.  Smoking cessation is advised. - prn Nitroglycerin, Morphine - Risk factor stratification: will check FLP and A1C  - check UDS - f/u 2d echo  Tobacco abuse: -Did counseling about importance of quitting smoking -Nicotine patch  Leukocytosis: WBC 14.3, No fever, no source of infection identified, likely reactive -Follow-up with CBC  Anxiety -Continue home Ativan  Cough: Likely due to smoking. -As needed Mucinex -As needed albuterol -f/u chest x-ray         DVT ppx: SQ Lovenox Code Status: Full code Family Communication: not done, no family member is at bed side.    Disposition Plan:  Anticipate discharge back to previous environment Consults called:  Dr. Kirke Corin of card Admission status: SDU/inpation       Status is: Inpatient  Remains inpatient appropriate because:Inpatient level of care appropriate due to severity of illness   Dispo: The patient is from: Home              Anticipated d/c is to: Home              Anticipated d/c date is: 2 days              Patient currently is not medically stable to d/c.            Date of Service 05/14/2020    Lorretta Harp [facility]   If 7PM-7AM, please contact night-coverage [email or web address] 05/14/2020, 6:09 PM

## 2020-05-14 NOTE — ED Triage Notes (Signed)
BIBA from home c/o CP concern for STEMI. Rec'd 324 aspirin and 1 nitro spray PTA. Arrives awake alert in NAD.

## 2020-05-14 NOTE — Progress Notes (Signed)
CH took over care of family from [facility]; pt. came in to hospital as CODE STEMI; CH introduced to pt.'s dtr. Heather; pt.'s sister and mother also recently arrived, waiting in medical mall.  CH coordinated w/AC and cath lab staff to arrange for brief encounter b/w pt. and family as he was being wheeled from cath lab to ICU.  Per dtr. pt. does not have designated MPOA; pt.'s mother seems to be medical decisionmaker, but per dtr. and sister mother suffers from some dementia and requires constant supervision.  Family is aware of one visitor policy and will coordinate visitation in coming days.  Family present today agree that dtr. Herbert Seta is contact person for medical staff.  CH entered dtr.'s contact info into chart.  CH remains available as needed.

## 2020-05-15 ENCOUNTER — Inpatient Hospital Stay (HOSPITAL_COMMUNITY)
Admit: 2020-05-15 | Discharge: 2020-05-15 | Disposition: A | Discharge status: Home / Self Care | Payer: Self-pay | Attending: Cardiovascular Disease | Admitting: Cardiovascular Disease

## 2020-05-15 ENCOUNTER — Encounter: Payer: Self-pay | Admitting: Cardiovascular Disease

## 2020-05-15 DIAGNOSIS — I2119 ST elevation (STEMI) myocardial infarction involving other coronary artery of inferior wall: Secondary | ICD-10-CM

## 2020-05-15 LAB — ECHOCARDIOGRAM COMPLETE
AR max vel: 2.78 cm2
AV Area VTI: 2.87 cm2
AV Area mean vel: 2.68 cm2
AV Mean grad: 6 mmHg
AV Peak grad: 11.4 mmHg
Ao pk vel: 1.69 m/s
Area-P 1/2: 4.31 cm2
Calc EF: 39.7 %
Height: 66 in
S' Lateral: 3.78 cm
Single Plane A2C EF: 62.8 %
Single Plane A4C EF: 32.2 %
Weight: 3231.06 oz

## 2020-05-15 LAB — BASIC METABOLIC PANEL
Anion gap: 8 (ref 5–15)
BUN: 9 mg/dL (ref 6–20)
CO2: 24 mmol/L (ref 22–32)
Calcium: 8.5 mg/dL — ABNORMAL LOW (ref 8.9–10.3)
Chloride: 109 mmol/L (ref 98–111)
Creatinine, Ser: 0.73 mg/dL (ref 0.61–1.24)
GFR, Estimated: >60 mL/min (ref >60)
Glucose, Bld: 115 mg/dL — ABNORMAL HIGH (ref 70–99)
Potassium: 3.9 mmol/L (ref 3.5–5.1)
Sodium: 141 mmol/L (ref 135–145)

## 2020-05-15 LAB — LIPID PANEL
Cholesterol: 121 mg/dL (ref 0–200)
HDL: 25 mg/dL — ABNORMAL LOW
LDL Cholesterol: 64 mg/dL (ref 0–99)
Total CHOL/HDL Ratio: 4.8 ratio
Triglycerides: 161 mg/dL — ABNORMAL HIGH
VLDL: 32 mg/dL (ref 0–40)

## 2020-05-15 LAB — CBC
HCT: 38.9 % — ABNORMAL LOW (ref 39.0–52.0)
Hemoglobin: 12.8 g/dL — ABNORMAL LOW (ref 13.0–17.0)
MCH: 31.4 pg (ref 26.0–34.0)
MCHC: 32.9 g/dL (ref 30.0–36.0)
MCV: 95.6 fL (ref 80.0–100.0)
Platelets: 392 10*3/uL (ref 150–400)
RBC: 4.07 MIL/uL — ABNORMAL LOW (ref 4.22–5.81)
RDW: 12.3 % (ref 11.5–15.5)
WBC: 13 10*3/uL — ABNORMAL HIGH (ref 4.0–10.5)
nRBC: 0 % (ref 0.0–0.2)

## 2020-05-15 LAB — TROPONIN I (HIGH SENSITIVITY): Troponin I (High Sensitivity): 4593 ng/L

## 2020-05-15 LAB — HEMOGLOBIN A1C
Hgb A1c MFr Bld: 6 % — ABNORMAL HIGH (ref 4.8–5.6)
Mean Plasma Glucose: 125.5 mg/dL

## 2020-05-15 MED ORDER — ASPIRIN 81 MG PO CHEW
81.0000 mg | CHEWABLE_TABLET | ORAL | Status: AC
Start: 2020-05-16 — End: 2020-05-16
  Administered 2020-05-16: 81 mg via ORAL
  Filled 2020-05-15: qty 1

## 2020-05-15 MED ORDER — SODIUM CHLORIDE 0.9 % IV SOLN: INTRAVENOUS | Status: DC

## 2020-05-15 MED ORDER — SODIUM CHLORIDE 0.9% FLUSH: 3.0000 mL | INTRAVENOUS | Status: DC | PRN

## 2020-05-15 MED ORDER — PERFLUTREN LIPID MICROSPHERE
1.0000 mL | INTRAVENOUS | Status: AC | PRN
  Administered 2020-05-15: 2 mL via INTRAVENOUS
  Filled 2020-05-15: qty 10

## 2020-05-15 MED ORDER — SODIUM CHLORIDE 0.9% FLUSH
3.0000 mL | Freq: Two times a day (BID) | INTRAVENOUS | Status: DC
  Administered 2020-05-15 – 2020-05-17 (×3): 3 mL via INTRAVENOUS

## 2020-05-15 MED ORDER — SODIUM CHLORIDE 0.9 % IV SOLN: 250.0000 mL | INTRAVENOUS | Status: DC | PRN

## 2020-05-15 NOTE — Progress Notes (Signed)
Neuro: stable at baseline Resp: stable on room air CV: afebrile, vital signs stable, tolerating aggrestat well GIGU: BM today, voiding in urinal, tolerating PO well Skin: clean dry and intact, sacral foam in place Social: Daughter at the bedside this afternoon, full update given, all questions and concerns addressed  Events: prep for cath lac tomorrow, NPO after midnight

## 2020-05-15 NOTE — Progress Notes (Signed)
PROGRESS NOTE    Matthew SiaGary D Hast  ZOX:[id number] DOB: 1965-09-07 DOA: 05/14/2020 PCP: Patient, No Pcp Per   Brief Narrative:  54 year old male presents with chest pain  Patient states that he has been having intermittent mild chest pain for several weeks, which has worsened since last night. The chest pain becomes more persistent and more severe. It is located in the substernal area, initially 7 out of 10 severity, pressure-like, nonradiating. Patient has mild shortness breath. He has cough with clear mucus production, no fever or chills. Patient states that he had diarrhea in the past 3 days, which has resolved today. No nausea, vomiting, abdominal pain. No symptoms of UTI. Per EMS, EKG showed inferior Q waves with inferior ST elevation and reciprocal changes in the anterior leads. Pt underwent urgent cardiac cath by Dr. Kirke CorinArida  12/22: Significant disease noted on cardiac catheterization.  Interventional cardiologist felt that the risks of placing multiple stents with outweigh the benefits.  Patient was maintaining on an Aggrastat infusion.  This morning he is chest pain-free   Assessment & Plan:   Principal Problem:   Acute ST elevation myocardial infarction (STEMI) of inferior wall (HCC) Active Problems:   Tobacco abuse   Leukocytosis   Anxiety   Cough  Acute STEMI, inferior wall Chest pain Status post cardiac catheterization 05/14/2020 Chest pain resolved Plan: Continue aspirin and Brilinta Continue Aggrastat infusion x48 hours Plan return to Cath Lab for left heart cath tomorrow Continue Lipitor and Coreg N.p.o. after midnight  Tobacco abuse Polysubstance abuse UDS positive for multiple illicit substances Counseled patient Offer nicotine patch  Anxiety Continuing home lorazepam  DVT prophylaxis: Lovenox Code Status: Full Family Communication: None today Disposition Plan: Status is: Inpatient  Remains inpatient appropriate because:Inpatient level of care  appropriate due to severity of illness   Dispo: The patient is from: Home              Anticipated d/c is to: Home              Anticipated d/c date is: 2 days              Patient currently is not medically stable to d/c.   Plan to return to Cath Lab for left heart cath 05/16/2020      Consultants:   Cardiology-[facility]  Procedures:   Left heart cath with PCI 05/14/2020  Antimicrobials:   None   Subjective: Seen and examined.  Resting comfortably in bed.  No visible distress.  No complaints of chest pain  Objective: Vitals:   05/15/20 0700 05/15/20 0800 05/15/20 0900 05/15/20 1000  BP: 135/84 (!) 137/93 133/84 111/65  Pulse: 85 87 84 85  Resp: (!) 21 17 18  (!) 22  Temp:  97.8 F (36.6 C)    TempSrc:  Oral    SpO2: 97% 99% 97% 96%  Weight:      Height:        Intake/Output Summary (Last 24 hours) at 05/15/2020 1445 Last data filed at 05/15/2020 1100 Gross per 24 hour  Intake 728.94 ml  Output 1350 ml  Net -621.06 ml   Filed Weights   05/14/20 1253 05/14/20 1459  Weight: 86.2 kg 91.6 kg    Examination:  General exam: Appears calm and comfortable  Respiratory system: Clear to auscultation. Respiratory effort normal. Cardiovascular system: S1 & S2 heard, RRR. No JVD, murmurs, rubs, gallops or clicks. No pedal edema. Gastrointestinal system: Abdomen is nondistended, soft and nontender. No organomegaly or masses felt.  Normal bowel sounds heard. Central nervous system: Alert and oriented. No focal neurological deficits. Extremities: Symmetric 5 x 5 power. Skin: No rashes, lesions or ulcers Psychiatry: Judgement and insight appear normal. Mood & affect appropriate.     Data Reviewed: I have personally reviewed following labs and imaging studies  CBC: Recent Labs  Lab 05/14/20 1256 05/14/20 1510 05/15/20 0322  WBC 14.3* 12.2* 13.0*  NEUTROABS 9.7*  --   --   HGB 13.7 13.2 12.8*  HCT 41.7 39.5 38.9*  MCV 95.0 95.2 95.6  PLT 417* 347 392   Basic  Metabolic Panel: Recent Labs  Lab 05/14/20 1256 05/14/20 1510 05/15/20 0322  NA 136  --  141  K 4.1  --  3.9  CL 104  --  109  CO2 22  --  24  GLUCOSE 138*  --  115*  BUN 7  --  9  CREATININE 0.66 0.69 0.73  CALCIUM 9.0  --  8.5*   GFR: Estimated Creatinine Clearance: 110.5 mL/min (by C-G formula based on SCr of 0.73 mg/dL). Liver Function Tests: No results for input(s): AST, ALT, ALKPHOS, BILITOT, PROT, ALBUMIN in the last 168 hours. No results for input(s): LIPASE, AMYLASE in the last 168 hours. No results for input(s): AMMONIA in the last 168 hours. Coagulation Profile: Recent Labs  Lab 05/14/20 1249  INR 1.0   Cardiac Enzymes: No results for input(s): CKTOTAL, CKMB, CKMBINDEX, TROPONINI in the last 168 hours. BNP (last 3 results) No results for input(s): PROBNP in the last 8760 hours. HbA1C: Recent Labs    05/15/20 0322  HGBA1C 6.0*   CBG: Recent Labs  Lab 05/14/20 1503  GLUCAP 92   Lipid Profile: Recent Labs    05/15/20 0322  CHOL 121  HDL 25*  LDLCALC 64  TRIG 852*  CHOLHDL 4.8   Thyroid Function Tests: No results for input(s): TSH, T4TOTAL, FREET4, T3FREE, THYROIDAB in the last 72 hours. Anemia Panel: No results for input(s): VITAMINB12, FOLATE, FERRITIN, TIBC, IRON, RETICCTPCT in the last 72 hours. Sepsis Labs: No results for input(s): PROCALCITON, LATICACIDVEN in the last 168 hours.  Recent Results (from the past 240 hour(s))  Resp Panel by RT-PCR (Flu A&B, Covid) Nasopharyngeal Swab     Status: None   Collection Time: 05/14/20 12:49 PM   Specimen: Nasopharyngeal Swab; Nasopharyngeal(NP) swabs in vial transport medium  Result Value Ref Range Status   SARS Coronavirus 2 by RT PCR NEGATIVE NEGATIVE Final    Comment: (NOTE) SARS-CoV-2 target nucleic acids are NOT DETECTED.  The SARS-CoV-2 RNA is generally detectable in upper respiratory specimens during the acute phase of infection. The lowest concentration of SARS-CoV-2 viral copies this  assay can detect is 138 copies/mL. A negative result does not preclude SARS-Cov-2 infection and should not be used as the sole basis for treatment or other patient management decisions. A negative result may occur with  improper specimen collection/handling, submission of specimen other than nasopharyngeal swab, presence of viral mutation(s) within the areas targeted by this assay, and inadequate number of viral copies(<138 copies/mL). A negative result must be combined with clinical observations, patient history, and epidemiological information. The expected result is Negative.  Fact Sheet for Patients:  BloggerCourse.com  Fact Sheet for Healthcare Providers:  SeriousBroker.it  This test is no t yet approved or cleared by the [place] FDA and  has been authorized for detection and/or diagnosis of SARS-CoV-2 by FDA under an Emergency Use Authorization (EUA). This EUA will remain  in effect (  meaning this test can be used) for the duration of the COVID-19 declaration under Section 564(b)(1) of the Act, 21 U.S.C.section 360bbb-3(b)(1), unless the authorization is terminated  or revoked sooner.       Influenza A by PCR NEGATIVE NEGATIVE Final   Influenza B by PCR NEGATIVE NEGATIVE Final    Comment: (NOTE) The Xpert Xpress SARS-CoV-2/FLU/RSV plus assay is intended as an aid in the diagnosis of influenza from Nasopharyngeal swab specimens and should not be used as a sole basis for treatment. Nasal washings and aspirates are unacceptable for Xpert Xpress SARS-CoV-2/FLU/RSV testing.  Fact Sheet for Patients: BloggerCourse.com  Fact Sheet for Healthcare Providers: SeriousBroker.it  This test is not yet approved or cleared by the [place] FDA and has been authorized for detection and/or diagnosis of SARS-CoV-2 by FDA under an Emergency Use Authorization (EUA). This EUA will  remain in effect (meaning this test can be used) for the duration of the COVID-19 declaration under Section 564(b)(1) of the Act, 21 U.S.C. section 360bbb-3(b)(1), unless the authorization is terminated or revoked.  Performed at [facility], [address] [place], [place] [address]   MRSA PCR Screening     Status: None   Collection Time: 05/14/20  3:02 PM   Specimen: Nasopharyngeal  Result Value Ref Range Status   MRSA by PCR NEGATIVE NEGATIVE Final    Comment:        The GeneXpert MRSA Assay (FDA approved for NASAL specimens only), is one component of a comprehensive MRSA colonization surveillance program. It is not intended to diagnose MRSA infection nor to guide or monitor treatment for MRSA infections. Performed at [facility], [address] [place], [place] [address]          Radiology Studies: CARDIAC CATHETERIZATION  Result Date: 05/14/2020  There is moderate left ventricular systolic dysfunction.  LV end diastolic pressure is severely elevated.  Prox RCA to Dist RCA lesion is 100% stenosed.  Post intervention, there is a 80% residual stenosis.  Balloon angioplasty was performed using a BALLOON TREK RX 2.5X20.  Mid Cx to Dist Cx lesion is 95% stenosed.  2nd Mrg lesion is 80% stenosed.  Prox LAD to Mid LAD lesion is 30% stenosed.  Mid LAD to Dist LAD lesion is 40% stenosed.  1.  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  In addition, there is high-grade stenosis in the mid left circumflex at the bifurcation of OM 2.  The LAD has mild nonobstructive disease. 2.  Moderately reduced LV systolic function with an EF of 30 to 35% and severely elevated left ventricular end-diastolic pressure. 3.  Aspiration thrombectomy and balloon angioplasty was done to the right  coronary artery with partial success.  I was able to establish TIMI II flow but not able to clear all the thrombus. Recommendations: Continue Aggrastat infusion for 48 hours. Continue dual antiplatelet therapy with aspirin and ticagrelor. The plan is to relook angiography on Thursday to see if the right coronary artery is salvageable and also to perform a staged PCI on the left circumflex. I started small dose carvedilol for cardiomyopathy. Diuresis as needed. High-dose atorvastatin. Smoking cessation is advised.   DG Chest Port 1 View  Result Date: 05/14/2020 CLINICAL DATA:  Cough EXAM: PORTABLE CHEST 1 VIEW COMPARISON:  April 21, 2019 FINDINGS: The heart size and mediastinal contours are within  normal limits. Both lungs are clear. The visualized skeletal structures are unremarkable. IMPRESSION: No active disease. Electronically Signed   By: Katherine Mantle M.D.   On: 05/14/2020 19:29        Scheduled Meds: . aspirin  81 mg Oral Daily  . atorvastatin  80 mg Oral Daily  . carvedilol  3.125 mg Oral BID WC  . Chlorhexidine Gluconate Cloth  6 each Topical Daily  . enoxaparin (LOVENOX) injection  0.5 mg/kg Subcutaneous Q24H  . nicotine  21 mg Transdermal Daily  . sodium chloride flush  3 mL Intravenous Q12H  . ticagrelor  90 mg Oral BID   Continuous Infusions: . sodium chloride    . tirofiban 0.15 mcg/kg/min (05/15/20 1100)     LOS: 1 day    Time spent: 25 minutes    Tresa Moore, MD [facility] Pager [phone]  If 7PM-7AM, please contact night-coverage 05/15/2020, 2:45 PM

## 2020-05-15 NOTE — Progress Notes (Signed)
*  PRELIMINARY RESULTS* Echocardiogram 2D Echocardiogram has been performed.  Joanette Gula Shirell Struthers 05/15/2020, 10:02 AM

## 2020-05-15 NOTE — H&P (View-Only) (Signed)
Progress Note  Patient Name: Matthew Tapia Date of Encounter: 05/15/2020  Primary Cardiologist: [facility], Dr. Kirke Corin  Subjective   No chest pain or shortness of breath.  Slept well overnight.  Denies any pain s/p catheterization or at his arteriotomy site.  Inpatient Medications    Scheduled Meds: . aspirin  81 mg Oral Daily  . atorvastatin  80 mg Oral Daily  . carvedilol  3.125 mg Oral BID WC  . Chlorhexidine Gluconate Cloth  6 each Topical Daily  . enoxaparin (LOVENOX) injection  0.5 mg/kg Subcutaneous Q24H  . nicotine  21 mg Transdermal Daily  . sodium chloride flush  3 mL Intravenous Q12H  . ticagrelor  90 mg Oral BID   Continuous Infusions: . sodium chloride    . tirofiban 0.15 mcg/kg/min (05/15/20 1100)   PRN Meds: sodium chloride, acetaminophen, albuterol, dextromethorphan-guaiFENesin, LORazepam, morphine injection, nitroGLYCERIN, ondansetron (ZOFRAN) IV, sodium chloride flush   Vital Signs    Vitals:   05/15/20 0700 05/15/20 0800 05/15/20 0900 05/15/20 1000  BP: 135/84 (!) 137/93 133/84 111/65  Pulse: 85 87 84 85  Resp: (!) 21 17 18  (!) 22  Temp:  97.8 F (36.6 C)    TempSrc:  Oral    SpO2: 97% 99% 97% 96%  Weight:      Height:        Intake/Output Summary (Last 24 hours) at 05/15/2020 1233 Last data filed at 05/15/2020 1100 Gross per 24 hour  Intake 728.94 ml  Output 1350 ml  Net -621.06 ml   Last 3 Weights 05/14/2020 05/14/2020 02/09/2020  Weight (lbs) 201 lb 15.1 oz 190 lb 192 lb  Weight (kg) 91.6 kg 86.183 kg 87.091 kg      Telemetry    NSR, 70s to 90s- Personally Reviewed  ECG    No new tracings- Personally Reviewed  Physical Exam   GEN: No acute distress.   Neck:  JVD difficult to assess due to body habitus Cardiac: RRR, no murmurs, rubs, or gallops.  Right radial arteriotomy site without any signs of hematoma or infection. Respiratory:  Bilateral diffuse wheezing. GI: Soft, nontender, non-distended  MS: No edema; No  deformity. Neuro:  Nonfocal  Psych: Normal affect   Labs    High Sensitivity Troponin:   Recent Labs  Lab 05/14/20 1256 05/14/20 1510 05/15/20 0322  TROPONINIHS 1,815* 1,989* 4,593*      Chemistry Recent Labs  Lab 05/14/20 1256 05/14/20 1510 05/15/20 0322  NA 136  --  141  K 4.1  --  3.9  CL 104  --  109  CO2 22  --  24  GLUCOSE 138*  --  115*  BUN 7  --  9  CREATININE 0.66 0.69 0.73  CALCIUM 9.0  --  8.5*  GFRNONAA >60 >60 >60  ANIONGAP 10  --  8     Hematology Recent Labs  Lab 05/14/20 1256 05/14/20 1510 05/15/20 0322  WBC 14.3* 12.2* 13.0*  RBC 4.39 4.15* 4.07*  HGB 13.7 13.2 12.8*  HCT 41.7 39.5 38.9*  MCV 95.0 95.2 95.6  MCH 31.2 31.8 31.4  MCHC 32.9 33.4 32.9  RDW 12.3 12.4 12.3  PLT 417* 347 392    BNP Recent Labs  Lab 05/14/20 1249  BNP 534.9*     DDimer No results for input(s): DDIMER in the last 168 hours.   Radiology    CARDIAC CATHETERIZATION  Result Date: 05/14/2020  There is moderate left ventricular systolic dysfunction.  LV end diastolic pressure  is severely elevated.  Prox RCA to Dist RCA lesion is 100% stenosed.  Post intervention, there is a 80% residual stenosis.  Balloon angioplasty was performed using a BALLOON TREK RX 2.5X20.  Mid Cx to Dist Cx lesion is 95% stenosed.  2nd Mrg lesion is 80% stenosed.  Prox LAD to Mid LAD lesion is 30% stenosed.  Mid LAD to Dist LAD lesion is 40% stenosed.  1.  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  In addition, there is high-grade stenosis in the mid left circumflex at the bifurcation of OM 2.  The LAD has mild nonobstructive disease. 2.  Moderately reduced LV systolic function with an EF of 30 to 35% and severely elevated left ventricular end-diastolic pressure. 3.  Aspiration thrombectomy and balloon  angioplasty was done to the right coronary artery with partial success.  I was able to establish TIMI II flow but not able to clear all the thrombus. Recommendations: Continue Aggrastat infusion for 48 hours. Continue dual antiplatelet therapy with aspirin and ticagrelor. The plan is to relook angiography on Thursday to see if the right coronary artery is salvageable and also to perform a staged PCI on the left circumflex. I started small dose carvedilol for cardiomyopathy. Diuresis as needed. High-dose atorvastatin. Smoking cessation is advised.   DG Chest Port 1 View  Result Date: 05/14/2020 CLINICAL DATA:  Cough EXAM: PORTABLE CHEST 1 VIEW COMPARISON:  April 21, 2019 FINDINGS: The heart size and mediastinal contours are within normal limits. Both lungs are clear. The visualized skeletal structures are unremarkable. IMPRESSION: No active disease. Electronically Signed   By: Katherine Mantle M.D.   On: 05/14/2020 19:29    Cardiac Studies   Echo pending  LHC 05/14/20  There is moderate left ventricular systolic dysfunction.  LV end diastolic pressure is severely elevated.  Prox RCA to Dist RCA lesion is 100% stenosed.  Post intervention, there is a 80% residual stenosis.  Balloon angioplasty was performed using a BALLOON TREK RX 2.5X20.  Mid Cx to Dist Cx lesion is 95% stenosed.  2nd Mrg lesion is 80% stenosed.  Prox LAD to Mid LAD lesion is 30% stenosed.  Mid LAD to Dist LAD lesion is 40% stenosed. 1.  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  In addition, there is high-grade stenosis in the mid left circumflex at the bifurcation of OM 2.  The LAD has mild nonobstructive disease. 2.  Moderately reduced LV systolic function with an EF of 30 to 35% and severely elevated left ventricular  end-diastolic pressure. 3.  Aspiration thrombectomy and balloon angioplasty was done to the right coronary artery with partial success.  I was able to establish TIMI II flow but not able to clear all the thrombus. Recommendations: Continue Aggrastat infusion for 48 hours. Continue dual antiplatelet therapy with aspirin and ticagrelor. The plan is to relook angiography on Thursday to see if the right coronary artery is salvageable and also to perform a staged PCI on the left circumflex. I started small dose carvedilol for cardiomyopathy. Diuresis as needed. High-dose atorvastatin. Smoking cessation is advised.    Patient Profile     54 y.o. male with history of anxiety and tobacco use s/p 05/14/2020 inferior ST elevation  myocardial infarction with subsequent cardiac catheterization as below.  Assessment & Plan    Late presenting inferior ST elevation myocardial infarction --No current CP. HS Tn 4593.0. Emergent LHC performed via right radial artery with occluded RCA with L-R collaterals and high-grade stenosis of the mid left circumflex.  RCA was full thrombus from the proximal to distal segment.  Aspiration thrombectomy and balloon angioplasty performed.  Unfortunately, not all of the thrombus was able to be cleared and it was felt overlapping stents would be to risky 2/2 risk of distal embolization, shutting down the entire distal RCA.  He was started on Aggrastat infusion with recommendation to continue for 48 hours with relook angiography on 05/16/2020.  Plan is for staged left circumflex PCI also on 12/23.  Continue DAPT with ASA and ticagrelor.  Continue initiated high-dose atorvastatin. Daily BMET and CBC.   Ischemic cardiomyopathy due to late presenting myocardial infarction --Pending official read of updated echo.  EF 30% by LV angiography.  LVEDP significantly elevated.  Continue to diurese as needed.  Euvolemic on exam today.  Continue carvedilol.  With consistent room in BP,  consider addition of ACE/ARB as Cr tolerates with current Cr stable.   Tobacco use --Smoking cessation recommended cessation recommended.  He reports today that he has been able to go without tobacco since admission without use of nicotine patch.  He is motivated to quit smoking.  Polysubstance use --UA positive for multiple substances, including amphetamines, benzodiazepines, and cocaine, and with cessation recommended.   HLD --Continue atorvastatin.  Elevated A1c --Per IM.  Most recent hemoglobin A1c 6.0.  Glycemic control recommended for risk factor modification.  For questions or updates, please contact [facility] Please consult [email or web address] for contact info under        Signed, Lennon Alstrom, PA-C  05/15/2020, 12:33 PM

## 2020-05-15 NOTE — Progress Notes (Signed)
Progress Note  Patient Name: Matthew Tapia Date of Encounter: 05/15/2020  Primary Cardiologist: New, Dr. Kirke Corin  Subjective   No chest pain or shortness of breath.  Slept well overnight.  Denies any pain s/p catheterization or at his arteriotomy site.  Inpatient Medications    Scheduled Meds: . aspirin  81 mg Oral Daily  . atorvastatin  80 mg Oral Daily  . carvedilol  3.125 mg Oral BID WC  . Chlorhexidine Gluconate Cloth  6 each Topical Daily  . enoxaparin (LOVENOX) injection  0.5 mg/kg Subcutaneous Q24H  . nicotine  21 mg Transdermal Daily  . sodium chloride flush  3 mL Intravenous Q12H  . ticagrelor  90 mg Oral BID   Continuous Infusions: . sodium chloride    . tirofiban 0.15 mcg/kg/min (05/15/20 1100)   PRN Meds: sodium chloride, acetaminophen, albuterol, dextromethorphan-guaiFENesin, LORazepam, morphine injection, nitroGLYCERIN, ondansetron (ZOFRAN) IV, sodium chloride flush   Vital Signs    Vitals:   05/15/20 0700 05/15/20 0800 05/15/20 0900 05/15/20 1000  BP: 135/84 (!) 137/93 133/84 111/65  Pulse: 85 87 84 85  Resp: (!) 21 17 18  (!) 22  Temp:  97.8 F (36.6 C)    TempSrc:  Oral    SpO2: 97% 99% 97% 96%  Weight:      Height:        Intake/Output Summary (Last 24 hours) at 05/15/2020 1233 Last data filed at 05/15/2020 1100 Gross per 24 hour  Intake 728.94 ml  Output 1350 ml  Net -621.06 ml   Last 3 Weights 05/14/2020 05/14/2020 02/09/2020  Weight (lbs) 201 lb 15.1 oz 190 lb 192 lb  Weight (kg) 91.6 kg 86.183 kg 87.091 kg      Telemetry    NSR, 70s to 90s- Personally Reviewed  ECG    No new tracings- Personally Reviewed  Physical Exam   GEN: No acute distress.   Neck:  JVD difficult to assess due to body habitus Cardiac: RRR, no murmurs, rubs, or gallops.  Right radial arteriotomy site without any signs of hematoma or infection. Respiratory:  Bilateral diffuse wheezing. GI: Soft, nontender, non-distended  MS: No edema; No  deformity. Neuro:  Nonfocal  Psych: Normal affect   Labs    High Sensitivity Troponin:   Recent Labs  Lab 05/14/20 1256 05/14/20 1510 05/15/20 0322  TROPONINIHS 1,815* 1,989* 4,593*      Chemistry Recent Labs  Lab 05/14/20 1256 05/14/20 1510 05/15/20 0322  NA 136  --  141  K 4.1  --  3.9  CL 104  --  109  CO2 22  --  24  GLUCOSE 138*  --  115*  BUN 7  --  9  CREATININE 0.66 0.69 0.73  CALCIUM 9.0  --  8.5*  GFRNONAA >60 >60 >60  ANIONGAP 10  --  8     Hematology Recent Labs  Lab 05/14/20 1256 05/14/20 1510 05/15/20 0322  WBC 14.3* 12.2* 13.0*  RBC 4.39 4.15* 4.07*  HGB 13.7 13.2 12.8*  HCT 41.7 39.5 38.9*  MCV 95.0 95.2 95.6  MCH 31.2 31.8 31.4  MCHC 32.9 33.4 32.9  RDW 12.3 12.4 12.3  PLT 417* 347 392    BNP Recent Labs  Lab 05/14/20 1249  BNP 534.9*     DDimer No results for input(s): DDIMER in the last 168 hours.   Radiology    CARDIAC CATHETERIZATION  Result Date: 05/14/2020  There is moderate left ventricular systolic dysfunction.  LV end diastolic pressure  is severely elevated.  Prox RCA to Dist RCA lesion is 100% stenosed.  Post intervention, there is a 80% residual stenosis.  Balloon angioplasty was performed using a BALLOON TREK RX 2.5X20.  Mid Cx to Dist Cx lesion is 95% stenosed.  2nd Mrg lesion is 80% stenosed.  Prox LAD to Mid LAD lesion is 30% stenosed.  Mid LAD to Dist LAD lesion is 40% stenosed.  1.  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  In addition, there is high-grade stenosis in the mid left circumflex at the bifurcation of OM 2.  The LAD has mild nonobstructive disease. 2.  Moderately reduced LV systolic function with an EF of 30 to 35% and severely elevated left ventricular end-diastolic pressure. 3.  Aspiration thrombectomy and balloon  angioplasty was done to the right coronary artery with partial success.  I was able to establish TIMI II flow but not able to clear all the thrombus. Recommendations: Continue Aggrastat infusion for 48 hours. Continue dual antiplatelet therapy with aspirin and ticagrelor. The plan is to relook angiography on Thursday to see if the right coronary artery is salvageable and also to perform a staged PCI on the left circumflex. I started small dose carvedilol for cardiomyopathy. Diuresis as needed. High-dose atorvastatin. Smoking cessation is advised.   DG Chest Port 1 View  Result Date: 05/14/2020 CLINICAL DATA:  Cough EXAM: PORTABLE CHEST 1 VIEW COMPARISON:  April 21, 2019 FINDINGS: The heart size and mediastinal contours are within normal limits. Both lungs are clear. The visualized skeletal structures are unremarkable. IMPRESSION: No active disease. Electronically Signed   By: Katherine Mantle M.D.   On: 05/14/2020 19:29    Cardiac Studies   Echo pending  LHC 05/14/20  There is moderate left ventricular systolic dysfunction.  LV end diastolic pressure is severely elevated.  Prox RCA to Dist RCA lesion is 100% stenosed.  Post intervention, there is a 80% residual stenosis.  Balloon angioplasty was performed using a BALLOON TREK RX 2.5X20.  Mid Cx to Dist Cx lesion is 95% stenosed.  2nd Mrg lesion is 80% stenosed.  Prox LAD to Mid LAD lesion is 30% stenosed.  Mid LAD to Dist LAD lesion is 40% stenosed. 1.  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  In addition, there is high-grade stenosis in the mid left circumflex at the bifurcation of OM 2.  The LAD has mild nonobstructive disease. 2.  Moderately reduced LV systolic function with an EF of 30 to 35% and severely elevated left ventricular  end-diastolic pressure. 3.  Aspiration thrombectomy and balloon angioplasty was done to the right coronary artery with partial success.  I was able to establish TIMI II flow but not able to clear all the thrombus. Recommendations: Continue Aggrastat infusion for 48 hours. Continue dual antiplatelet therapy with aspirin and ticagrelor. The plan is to relook angiography on Thursday to see if the right coronary artery is salvageable and also to perform a staged PCI on the left circumflex. I started small dose carvedilol for cardiomyopathy. Diuresis as needed. High-dose atorvastatin. Smoking cessation is advised.    Patient Profile     54 y.o. male with history of anxiety and tobacco use s/p 05/14/2020 inferior ST elevation  myocardial infarction with subsequent cardiac catheterization as below.  Assessment & Plan    Late presenting inferior ST elevation myocardial infarction --No current CP. [place] Tn 4593.0. Emergent LHC performed via right radial artery with occluded RCA with L-R collaterals and high-grade stenosis of the mid left circumflex.  RCA was full thrombus from the proximal to distal segment.  Aspiration thrombectomy and balloon angioplasty performed.  Unfortunately, not all of the thrombus was able to be cleared and it was felt overlapping stents would be to risky 2/2 risk of distal embolization, shutting down the entire distal RCA.  He was started on Aggrastat infusion with recommendation to continue for 48 hours with relook angiography on 05/16/2020.  Plan is for staged left circumflex PCI also on 12/23.  Continue DAPT with ASA and ticagrelor.  Continue initiated high-dose atorvastatin. Daily BMET and CBC.   Ischemic cardiomyopathy due to late presenting myocardial infarction --Pending official read of updated echo.  EF 30% by LV angiography.  LVEDP significantly elevated.  Continue to diurese as needed.  Euvolemic on exam today.  Continue carvedilol.  With consistent room in BP,  consider addition of ACE/ARB as Cr tolerates with current Cr stable.   Tobacco use --Smoking cessation recommended cessation recommended.  He reports today that he has been able to go without tobacco since admission without use of nicotine patch.  He is motivated to quit smoking.  Polysubstance use --UA positive for multiple substances, including amphetamines, benzodiazepines, and cocaine, and with cessation recommended.   HLD --Continue atorvastatin.  Elevated A1c --Per IM.  Most recent hemoglobin A1c 6.0.  Glycemic control recommended for risk factor modification.  For questions or updates, please contact [facility] Please consult [email or web address] for contact info under        Signed, Lennon Alstrom, PA-C  05/15/2020, 12:33 PM

## 2020-05-16 ENCOUNTER — Encounter: Payer: Self-pay | Admitting: Cardiology

## 2020-05-16 ENCOUNTER — Encounter
Admission: EM | Disposition: A | Discharge status: Other Acute Inpatient Hospital | Payer: Self-pay | Source: Home / Self Care | Attending: Internal Medicine

## 2020-05-16 ENCOUNTER — Encounter: Payer: Self-pay | Admitting: Internal Medicine

## 2020-05-16 DIAGNOSIS — I2510 Atherosclerotic heart disease of native coronary artery without angina pectoris: Secondary | ICD-10-CM

## 2020-05-16 DIAGNOSIS — F1910 Other psychoactive substance abuse, uncomplicated: Secondary | ICD-10-CM

## 2020-05-16 DIAGNOSIS — E785 Hyperlipidemia, unspecified: Secondary | ICD-10-CM

## 2020-05-16 DIAGNOSIS — I255 Ischemic cardiomyopathy: Secondary | ICD-10-CM

## 2020-05-16 DIAGNOSIS — R7309 Other abnormal glucose: Secondary | ICD-10-CM | POA: Insufficient documentation

## 2020-05-16 DIAGNOSIS — I10 Essential (primary) hypertension: Secondary | ICD-10-CM

## 2020-05-16 HISTORY — PX: INTRAVASCULAR PRESSURE WIRE/FFR STUDY: CATH118243

## 2020-05-16 HISTORY — PX: LEFT HEART CATH AND CORONARY ANGIOGRAPHY: CATH118249

## 2020-05-16 SURGERY — LEFT HEART CATH AND CORONARY ANGIOGRAPHY
Anesthesia: Moderate Sedation

## 2020-05-16 MED ORDER — HEPARIN SODIUM (PORCINE) 1000 UNIT/ML IJ SOLN
INTRAMUSCULAR | Status: AC
  Filled 2020-05-16: qty 1

## 2020-05-16 MED ORDER — FENTANYL CITRATE (PF) 100 MCG/2ML IJ SOLN
INTRAMUSCULAR | Status: DC | PRN
  Administered 2020-05-16: 25 ug via INTRAVENOUS

## 2020-05-16 MED ORDER — BENZONATATE 100 MG PO CAPS
200.0000 mg | ORAL_CAPSULE | Freq: Three times a day (TID) | ORAL | Status: DC
  Administered 2020-05-16 – 2020-05-17 (×3): 200 mg via ORAL
  Filled 2020-05-16 (×3): qty 2

## 2020-05-16 MED ORDER — LOSARTAN POTASSIUM 25 MG PO TABS
25.0000 mg | ORAL_TABLET | Freq: Every day | ORAL | Status: DC
  Administered 2020-05-16 – 2020-05-17 (×2): 25 mg via ORAL
  Filled 2020-05-16 (×4): qty 1

## 2020-05-16 MED ORDER — FENTANYL CITRATE (PF) 100 MCG/2ML IJ SOLN
INTRAMUSCULAR | Status: AC
  Filled 2020-05-16: qty 2

## 2020-05-16 MED ORDER — SODIUM CHLORIDE 0.9 % IV SOLN
250.0000 mL | INTRAVENOUS | Status: DC | PRN
  Administered 2020-05-16: 250 mL via INTRAVENOUS

## 2020-05-16 MED ORDER — MIDAZOLAM HCL 2 MG/2ML IJ SOLN
INTRAMUSCULAR | Status: DC | PRN
  Administered 2020-05-16: 1 mg via INTRAVENOUS

## 2020-05-16 MED ORDER — TIROFIBAN HCL IV 12.5 MG/250 ML
0.1500 ug/kg/min | INTRAVENOUS | Status: DC
  Administered 2020-05-16 – 2020-05-17 (×2): 0.15 ug/kg/min via INTRAVENOUS
  Filled 2020-05-16: qty 250

## 2020-05-16 MED ORDER — VERAPAMIL HCL 2.5 MG/ML IV SOLN
INTRAVENOUS | Status: AC
  Filled 2020-05-16: qty 2

## 2020-05-16 MED ORDER — SODIUM CHLORIDE 0.9% FLUSH: 3.0000 mL | INTRAVENOUS | Status: DC | PRN

## 2020-05-16 MED ORDER — HEPARIN (PORCINE) IN NACL 1000-0.9 UT/500ML-% IV SOLN
INTRAVENOUS | Status: DC | PRN
  Administered 2020-05-16: 1000 mL

## 2020-05-16 MED ORDER — HEPARIN SODIUM (PORCINE) 1000 UNIT/ML IJ SOLN
INTRAMUSCULAR | Status: DC | PRN
  Administered 2020-05-16: 10000 [IU] via INTRAVENOUS

## 2020-05-16 MED ORDER — HEPARIN (PORCINE) IN NACL 1000-0.9 UT/500ML-% IV SOLN
INTRAVENOUS | Status: AC
  Filled 2020-05-16: qty 1000

## 2020-05-16 MED ORDER — NITROGLYCERIN 1 MG/10 ML FOR IR/CATH LAB
INTRA_ARTERIAL | Status: AC
  Filled 2020-05-16: qty 10

## 2020-05-16 MED ORDER — IOHEXOL 300 MG/ML  SOLN
INTRAMUSCULAR | Status: DC | PRN
  Administered 2020-05-16: 90 mL

## 2020-05-16 MED ORDER — LIDOCAINE HCL (PF) 1 % IJ SOLN
INTRAMUSCULAR | Status: AC
  Filled 2020-05-16: qty 30

## 2020-05-16 MED ORDER — CARVEDILOL 3.125 MG PO TABS
3.1250 mg | ORAL_TABLET | Freq: Two times a day (BID) | ORAL | Status: DC
  Administered 2020-05-17: 3.125 mg via ORAL
  Filled 2020-05-16: qty 1

## 2020-05-16 MED ORDER — NITROGLYCERIN 1 MG/10 ML FOR IR/CATH LAB
INTRA_ARTERIAL | Status: DC | PRN
  Administered 2020-05-16: 200 ug

## 2020-05-16 MED ORDER — MIDAZOLAM HCL 2 MG/2ML IJ SOLN
INTRAMUSCULAR | Status: AC
  Filled 2020-05-16: qty 2

## 2020-05-16 MED ORDER — VERAPAMIL HCL 2.5 MG/ML IV SOLN
INTRAVENOUS | Status: DC | PRN
  Administered 2020-05-16: 2.5 mg via INTRA_ARTERIAL

## 2020-05-16 MED ORDER — SODIUM CHLORIDE 0.9% FLUSH
3.0000 mL | Freq: Two times a day (BID) | INTRAVENOUS | Status: DC
  Administered 2020-05-16 – 2020-05-17 (×3): 3 mL via INTRAVENOUS

## 2020-05-16 MED ORDER — SODIUM CHLORIDE 0.9 % IV SOLN: INTRAVENOUS | Status: AC

## 2020-05-16 SURGICAL SUPPLY — 10 items
CATH INFINITI JR4 5F (CATHETERS) ×1 IMPLANT
CATH VISTA GUIDE 6FR XB3 (CATHETERS) ×1 IMPLANT
DEVICE RAD TR BAND REGULAR (VASCULAR PRODUCTS) ×1 IMPLANT
GLIDESHEATH SLEND SS 6F .021 (SHEATH) ×1 IMPLANT
GUIDEWIRE INQWIRE 1.5J.035X260 (WIRE) IMPLANT
GUIDEWIRE PRESS OMNI 185 ST (WIRE) ×1 IMPLANT
INQWIRE 1.5J .035X260CM (WIRE) ×2
KIT ENCORE 26 ADVANTAGE (KITS) ×1 IMPLANT
KIT MANI 3VAL PERCEP (MISCELLANEOUS) ×2 IMPLANT
PACK CARDIAC CATH (CUSTOM PROCEDURE TRAY) ×2 IMPLANT

## 2020-05-16 NOTE — Discharge Summary (Signed)
Physician Discharge Summary  Etheleen SiaGary D Morrill ZOX:[id number] DOB: 11/25/1965 DOA: 05/14/2020  PCP: Patient, No Pcp Per  Admit date: 05/14/2020 Discharge date: 05/16/2020  Admitted From: Home [facility]: [facility]    Home Health: N/A Equipment/Devices: None Discharge Condition: Stable CODE STATUS: Full Diet recommendation: Heart Healthy  Brief/Interim Summary: 54 year old male presents with chest pain  Patient states that he has been having intermittent mild chest pain for several weeks, which has worsened since last night. The chest pain becomes more persistent and more severe. It is located in the substernal area, initially 7 out of 10 severity, pressure-like, nonradiating. Patienthas mildshortness breath.He has cough with clear mucus production, no fever or chills. Patient states that hehaddiarrhea in the past 3 days, which has resolved today. No nausea,vomiting, abdominal pain. No symptoms of UTI. Per EMS, EKG showed inferior Q waves with inferior ST elevation and reciprocal changes in the anterior leads.Pt underwenturgent cardiac cath by Dr. Kirke CorinArida  12/22: Significant disease noted on cardiac catheterization.  Interventional cardiologist felt that the risks of placing multiple stents with outweigh the benefits.  Patient was maintaining on an Aggrastat infusion.  This morning he is chest pain-free  12/23: Patient was maintained on Aggrastat infusion for 48 hours.  He returned to the Cath Lab with interventional cardiology today.  Catheter failed increased flow however unfortunately patient has severe multivessel coronary artery disease.  Discussed with cardiology, plan [facility].  Discharge Diagnoses:  Principal Problem:   Acute ST elevation myocardial infarction (STEMI) of inferior wall (HCC) Active Problems:   Tobacco abuse   Leukocytosis   Anxiety   Cough  Acute STEMI, inferior wall Chest pain Status post  cardiac catheterization 05/14/2020 Chest pain improved however patient had severe disease Was maintained on Aggrastat infusion and return to the Cath Lab on 05/16/2020 Catheterization revealed severe three-vessel coronary artery disease Discussed with interventional cardiology, [facility]  Cath report:   Prox LAD to Mid LAD lesion is 70% stenosed.  Mid LAD to Dist LAD lesion is 40% stenosed.  Mid Cx to Dist Cx lesion is 95% stenosed.  2nd Mrg lesion is 80% stenosed.  Prox RCA to Dist RCA lesion is 80% stenosed.  1st Diag lesion is 99% stenosed.   1.  Severe three-vessel coronary artery disease.  Thrombus burden in the right coronary artery improved significantly in the vessel has normal TIMI-3 flow but has diffuse disease from the proximal to the distal segment before the bifurcation.  In addition, the patient seems to have significant proximal to mid LAD disease which was highly significant by fractional flow reserve evaluation with an IFR ratio of 0.76.  There is complex mid left circumflex disease at the bifurcation of OM 2.  There is also subtotal occlusion of first diagonal which seems to be diffusely diseased.  2.  Left ventricular angiography was not performed.  EF was 35 to 40% by echo. 3.  Significant improvement in left ventricular end-diastolic pressure which is currently only mildly elevated at 17 mmHg.  Recommendations: Given diffuse disease in the right coronary artery and the presence of three-vessel coronary artery disease, recommend evaluation for CABG. I am going to discontinue treatment with Brilinta. Given residual thrombus in the right coronary artery, recommend continuing Aggrastat infusion until CABG. Suspect that CABG can be done early next week. I will make arrangements for transfer [facility].  Plan: [facility] Continue Aggrastat infusion  CABG likely early next week  Tobacco  abuse Polysubstance abuse UDS positive for multiple illicit substances Counseled patient Offer nicotine patch  Anxiety Continuing home lorazepam  Discharge Instructions     No Known Allergies  Consultations:  [facility]   Procedures/Studies: CARDIAC CATHETERIZATION  Result Date: 05/16/2020  Prox LAD to Mid LAD lesion is 70% stenosed.  Mid LAD to Dist LAD lesion is 40% stenosed.  Mid Cx to Dist Cx lesion is 95% stenosed.  2nd Mrg lesion is 80% stenosed.  Prox RCA to Dist RCA lesion is 80% stenosed.  1st Diag lesion is 99% stenosed.  1.  Severe three-vessel coronary artery disease.  Thrombus burden in the right coronary artery improved significantly in the vessel has normal TIMI-3 flow but has diffuse disease from the proximal to the distal segment before the bifurcation.  In addition, the patient seems to have significant proximal to mid LAD disease which was highly significant by fractional flow reserve evaluation with an IFR ratio of 0.76.  There is complex mid left circumflex disease at the bifurcation of OM 2.  There is also subtotal occlusion of first diagonal which seems to be diffusely diseased. 2.  Left ventricular angiography was not performed.  EF was 35 to 40% by echo. 3.  Significant improvement in left ventricular end-diastolic pressure which is currently only mildly elevated at 17 mmHg. Recommendations: Given diffuse disease in the right coronary artery and the presence of three-vessel coronary artery disease, recommend evaluation for CABG. I am going to discontinue treatment with Brilinta. Given residual thrombus in the right coronary artery, recommend continuing Aggrastat infusion until CABG. Suspect that CABG can be done early next week. I will make arrangements for transfer to [facility].   CARDIAC CATHETERIZATION  Result Date: 05/14/2020  There is moderate left ventricular systolic dysfunction.  LV end diastolic pressure is  severely elevated.  Prox RCA to Dist RCA lesion is 100% stenosed.  Post intervention, there is a 80% residual stenosis.  Balloon angioplasty was performed using a BALLOON TREK RX [id number].  Mid Cx to Dist Cx lesion is 95% stenosed.  2nd Mrg lesion is 80% stenosed.  Prox LAD to Mid LAD lesion is 30% stenosed.  Mid LAD to Dist LAD lesion is 40% stenosed.  1.  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  In addition, there is high-grade stenosis in the mid left circumflex at the bifurcation of OM 2.  The LAD has mild nonobstructive disease. 2.  Moderately reduced LV systolic function with an EF of 30 to 35% and severely elevated left ventricular end-diastolic pressure. 3.  Aspiration thrombectomy and balloon angioplasty was done to the right coronary artery with partial success.  I was able to establish TIMI II flow but not able to clear all the thrombus. Recommendations: Continue Aggrastat infusion for 48 hours. Continue dual antiplatelet therapy with aspirin and ticagrelor. The plan is to relook angiography on Thursday to see if the right coronary artery is salvageable and also to perform a staged PCI on the left circumflex. I started small dose carvedilol for cardiomyopathy. Diuresis as needed. High-dose atorvastatin. Smoking cessation is advised.   DG Chest Port 1 View  Result Date: 05/14/2020 CLINICAL DATA:  Cough EXAM: PORTABLE CHEST 1 VIEW COMPARISON:  April 21, 2019 FINDINGS: The heart size and mediastinal contours are within normal  limits. Both lungs are clear. The visualized skeletal structures are unremarkable. IMPRESSION: No active disease. Electronically Signed   By: Katherine Mantle M.D.   On: 05/14/2020 19:29   ECHOCARDIOGRAM COMPLETE  Result Date: 05/15/2020    ECHOCARDIOGRAM REPORT   Patient Name:    LAVANTE TOSO Date of Exam: 05/15/2020 Medical Rec #:  [id number]      Height:       66.0 in Accession #:    [id number]     Weight:       201.9 lb Date of Birth:  15-Jul-1965      BSA:          2.008 m Patient Age:    54 years       BP:           118/73 mmHg Patient Gender: M              HR:           83 bpm. Exam Location:  [facility] Procedure: 2D Echo, Color Doppler, Cardiac Doppler and Intracardiac            Opacification Agent Indications:     I21.9 Acute myocardial infarction  History:         Patient has no prior history of Echocardiogram examinations.                  Signs/Symptoms:Chest Pain; Risk Factors:Current Smoker.  Sonographer:     Humphrey Rolls RDCS (AE) Referring Phys:  [id number] [facility] A [facility] Diagnosing Phys: Debbe Odea MD IMPRESSIONS  1. Left ventricular ejection fraction, by estimation, is 35 to 40%. The left ventricle has moderately decreased function. The left ventricle demonstrates regional wall motion abnormalities (see scoring diagram/findings for description). Left ventricular  diastolic parameters are consistent with Grade I diastolic dysfunction (impaired relaxation). There is severe hypokinesis of the left ventricular, entire inferolateral wall and inferior wall. The average left ventricular global longitudinal strain is -11.6 %. The global longitudinal strain is abnormal.  2. Right ventricular systolic function is normal. The right ventricular size is normal.  3. The mitral valve is normal in structure. No evidence of mitral valve regurgitation.  4. The aortic valve was not well visualized. Aortic valve regurgitation is not visualized.  5. The inferior vena cava is normal in size with greater than 50% respiratory variability, suggesting right atrial pressure of 3 mmHg. FINDINGS  Left Ventricle: Left ventricular ejection fraction, by estimation, is 35 to 40%. The left ventricle has moderately decreased function. The left ventricle demonstrates regional wall motion abnormalities. Severe  hypokinesis of the left ventricular, entire  inferolateral wall and inferior wall. Definity contrast agent was given IV to delineate the left ventricular endocardial borders. The average left ventricular global longitudinal strain is -11.6 %. The global longitudinal strain is abnormal. The left ventricular internal cavity size was normal in size. There is no left ventricular hypertrophy. Left ventricular diastolic parameters are consistent with Grade I diastolic dysfunction (impaired relaxation). Right Ventricle: The right ventricular size is normal. No increase in right ventricular wall thickness. Right ventricular systolic function is normal. Left Atrium: Left atrial size was normal in size. Right Atrium: Right atrial size was normal in size. Pericardium: There is no evidence of pericardial effusion. Mitral Valve: The mitral valve is normal in structure. No evidence of mitral valve regurgitation. MV peak gradient, 4.8 mmHg. The mean mitral valve gradient is 2.0 mmHg. Tricuspid Valve: The tricuspid valve is normal in structure. Tricuspid valve regurgitation  is not demonstrated. Aortic Valve: The aortic valve was not well visualized. Aortic valve regurgitation is not visualized. Aortic valve mean gradient measures 6.0 mmHg. Aortic valve peak gradient measures 11.4 mmHg. Aortic valve area, by VTI measures 2.87 cm. Pulmonic Valve: The pulmonic valve was not well visualized. Pulmonic valve regurgitation is not visualized. Aorta: The aortic root is normal in size and structure. Venous: The inferior vena cava is normal in size with greater than 50% respiratory variability, suggesting right atrial pressure of 3 mmHg. IAS/Shunts: No atrial level shunt detected by color flow Doppler.  LEFT VENTRICLE PLAX 2D LVIDd:         5.18 cm      Diastology LVIDs:         3.78 cm      LV e' medial:    5.44 cm/s LV PW:         1.02 cm      LV E/e' medial:  13.0 LV IVS:        0.90 cm      LV e' lateral:   7.94 cm/s LVOT diam:     2.30  cm      LV E/e' lateral: 8.9 LV SV:         88 LV SV Index:   44           2D Longitudinal Strain LVOT Area:     4.15 cm     2D Strain GLS Avg:     -11.6 %  LV Volumes (MOD) LV vol d, MOD A2C: 115.0 ml LV vol d, MOD A4C: 105.0 ml LV vol s, MOD A2C: 42.8 ml LV vol s, MOD A4C: 71.2 ml LV SV MOD A2C:     72.2 ml LV SV MOD A4C:     105.0 ml LV SV MOD BP:      44.0 ml RIGHT VENTRICLE RV Basal diam:  2.48 cm RV S prime:     13.40 cm/s TAPSE (M-mode): 3.0 cm LEFT ATRIUM             Index       RIGHT ATRIUM           Index LA diam:        4.00 cm 1.99 cm/m  RA Area:     14.40 cm LA Vol (A2C):   40.5 ml 20.17 ml/m RA Volume:   32.40 ml  16.14 ml/m LA Vol (A4C):   37.7 ml 18.77 ml/m LA Biplane Vol: 39.8 ml 19.82 ml/m  AORTIC VALVE                    PULMONIC VALVE AV Area (Vmax):    2.78 cm     PV Vmax:       1.16 m/s AV Area (Vmean):   2.68 cm     PV Vmean:      78.600 cm/s AV Area (VTI):     2.87 cm     PV VTI:        0.200 m AV Vmax:           169.00 cm/s  PV Peak grad:  5.4 mmHg AV Vmean:          116.000 cm/s PV Mean grad:  3.0 mmHg AV VTI:            0.307 m AV Peak Grad:      11.4 mmHg AV Mean Grad:      6.0 mmHg LVOT Vmax:  113.00 cm/s LVOT Vmean:        74.900 cm/s LVOT VTI:          0.212 m LVOT/AV VTI ratio: 0.69  AORTA Ao Root diam: 3.50 cm MITRAL VALVE MV Area (PHT): 4.31 cm    SHUNTS MV Peak grad:  4.8 mmHg    Systemic VTI:  0.21 m MV Mean grad:  2.0 mmHg    Systemic Diam: 2.30 cm MV Vmax:       1.09 m/s MV Vmean:      65.6 cm/s MV Decel Time: 176 msec MV E velocity: 70.50 cm/s MV A velocity: 93.60 cm/s MV E/A ratio:  0.75 Debbe Odea MD Electronically signed by Debbe Odea MD Signature Date/Time: 05/15/2020/2:54:38 PM    Final     (Echo, Carotid, EGD, Colonoscopy, ERCP)    Subjective: Seen and examined.  Chest pain-free.  Stable for transfer to [facility] for [facility]G evaluation  Discharge Exam: Vitals:   05/16/20 0931 05/16/20 0945  BP: (!) 142/93 (!) 142/93   Pulse: 86 85  Resp: 16 20  Temp:    SpO2: 97% 95%   Vitals:   05/16/20 0711 05/16/20 0926 05/16/20 0931 05/16/20 0945  BP: (!) 136/93 (!) 131/95 (!) 142/93 (!) 142/93  Pulse: 85 91 86 85  Resp: 17 (!) 23 16 20   Temp: 98.6 F (37 C)     TempSrc: Oral     SpO2:  97% 97% 95%  Weight: 89.6 kg     Height: 5\' 6"  (1.676 m)       General: Pt is alert, awake, not in acute distress Cardiovascular: RRR, S1/S2 +, no rubs, no gallops Respiratory: CTA bilaterally, no wheezing, no rhonchi Abdominal: Soft, NT, ND, bowel sounds + Extremities: no edema, no cyanosis    The results of significant diagnostics from this hospitalization (including imaging, microbiology, ancillary and laboratory) are listed below for reference.     Microbiology: Recent Results (from the past 240 hour(s))  Resp Panel by RT-PCR (Flu A&B, Covid) Nasopharyngeal Swab     Status: None   Collection Time: 05/14/20 12:49 PM   Specimen: Nasopharyngeal Swab; Nasopharyngeal(NP) swabs in vial transport medium  Result Value Ref Range Status   SARS Coronavirus 2 by RT PCR NEGATIVE NEGATIVE Final    Comment: (NOTE) SARS-CoV-2 target nucleic acids are NOT DETECTED.  The SARS-CoV-2 RNA is generally detectable in upper respiratory specimens during the acute phase of infection. The lowest concentration of SARS-CoV-2 viral copies this assay can detect is 138 copies/mL. A negative result does not preclude SARS-Cov-2 infection and should not be used as the sole basis for treatment or other patient management decisions. A negative result may occur with  improper specimen collection/handling, submission of specimen other than nasopharyngeal swab, presence of viral mutation(s) within the areas targeted by this assay, and inadequate number of viral copies(<138 copies/mL). A negative result must be combined with clinical observations, patient history, and epidemiological information. The expected result is Negative.  Fact Sheet  for Patients:   Fact Sheet for Healthcare Providers:  05/16/20  This test is no t yet approved or cleared by the [employer] FDA and  has been authorized for detection and/or diagnosis of SARS-CoV-2 by FDA under an Emergency Use Authorization (EUA). This EUA will remain  in effect (meaning this test can be used) for the duration of the COVID-19 declaration under Section 564(b)(1) of the Act, 21 U.S.C.section 360bbb-3(b)(1), unless the authorization is terminated  or revoked sooner.  Influenza A by PCR NEGATIVE NEGATIVE Final   Influenza B by PCR NEGATIVE NEGATIVE Final    Comment: (NOTE) The Xpert Xpress SARS-CoV-2/FLU/RSV plus assay is intended as an aid in the diagnosis of influenza from Nasopharyngeal swab specimens and should not be used as a sole basis for treatment. Nasal washings and aspirates are unacceptable for Xpert Xpress SARS-CoV-2/FLU/RSV testing.  Fact Sheet for Patients: BloggerCourse.com  Fact Sheet for Healthcare Providers: SeriousBroker.it  This test is not yet approved or cleared by the [place] FDA and has been authorized for detection and/or diagnosis of SARS-CoV-2 by FDA under an Emergency Use Authorization (EUA). This EUA will remain in effect (meaning this test can be used) for the duration of the COVID-19 declaration under Section 564(b)(1) of the Act, 21 U.S.C. section 360bbb-3(b)(1), unless the authorization is terminated or revoked.  Performed at [facility], [address] [place], [place] [address]   MRSA PCR Screening     Status: None   Collection Time: 05/14/20  3:02 PM   Specimen: Nasopharyngeal  Result Value Ref Range Status   MRSA by PCR NEGATIVE NEGATIVE Final    Comment:        The GeneXpert MRSA Assay (FDA approved for NASAL specimens only), is one component of a comprehensive MRSA  colonization surveillance program. It is not intended to diagnose MRSA infection nor to guide or monitor treatment for MRSA infections. Performed at [facility], [address] [place], [place] [address]      Labs: BNP (last 3 results) Recent Labs    05/14/20 1249  BNP 534.9*   Basic Metabolic Panel: Recent Labs  Lab 05/14/20 1256 05/14/20 1510 05/15/20 0322  NA 136  --  141  K 4.1  --  3.9  CL 104  --  109  CO2 22  --  24  GLUCOSE 138*  --  115*  BUN 7  --  9  CREATININE 0.66 0.69 0.73  CALCIUM 9.0  --  8.5*   Liver Function Tests: No results for input(s): AST, ALT, ALKPHOS, BILITOT, PROT, ALBUMIN in the last 168 hours. No results for input(s): LIPASE, AMYLASE in the last 168 hours. No results for input(s): AMMONIA in the last 168 hours. CBC: Recent Labs  Lab 05/14/20 1256 05/14/20 1510 05/15/20 0322  WBC 14.3* 12.2* 13.0*  NEUTROABS 9.7*  --   --   HGB 13.7 13.2 12.8*  HCT 41.7 39.5 38.9*  MCV 95.0 95.2 95.6  PLT 417* 347 392   Cardiac Enzymes: No results for input(s): CKTOTAL, CKMB, CKMBINDEX, TROPONINI in the last 168 hours. BNP: Invalid input(s): POCBNP CBG: Recent Labs  Lab 05/14/20 1503  GLUCAP 92   D-Dimer No results for input(s): DDIMER in the last 72 hours. Hgb A1c Recent Labs    05/15/20 0322  HGBA1C 6.0*   Lipid Profile Recent Labs    05/15/20 0322  CHOL 121  HDL 25*  LDLCALC 64  TRIG 098*  CHOLHDL 4.8   Thyroid function studies No results for input(s): TSH, T4TOTAL, T3FREE, THYROIDAB in the last 72 hours.  Invalid input(s): FREET3 Anemia work up No results for input(s): VITAMINB12, FOLATE, FERRITIN, TIBC, IRON, RETICCTPCT in the last 72 hours. Urinalysis    Component Value Date/Time   COLORURINE AMBER (A) 01/16/2018 1907   APPEARANCEUR CLEAR (A) 01/16/2018 1907   LABSPEC 1.036 (H) 01/16/2018 1907   PHURINE 5.0 01/16/2018 1907   GLUCOSEU NEGATIVE 01/16/2018 1907   HGBUR NEGATIVE 01/16/2018 1907  BILIRUBINUR NEGATIVE 01/16/2018 1907   KETONESUR 5 (A) 01/16/2018 1907   PROTEINUR 30 (A) 01/16/2018 1907   NITRITE NEGATIVE 01/16/2018 1907   LEUKOCYTESUR NEGATIVE 01/16/2018 1907   Sepsis Labs Invalid input(s): PROCALCITONIN,  WBC,  LACTICIDVEN Microbiology Recent Results (from the past 240 hour(s))  Resp Panel by RT-PCR (Flu A&B, Covid) Nasopharyngeal Swab     Status: None   Collection Time: 05/14/20 12:49 PM   Specimen: Nasopharyngeal Swab; Nasopharyngeal(NP) swabs in vial transport medium  Result Value Ref Range Status   SARS Coronavirus 2 by RT PCR NEGATIVE NEGATIVE Final    Comment: (NOTE) SARS-CoV-2 target nucleic acids are NOT DETECTED.  The SARS-CoV-2 RNA is generally detectable in upper respiratory specimens during the acute phase of infection. The lowest concentration of SARS-CoV-2 viral copies this assay can detect is 138 copies/mL. A negative result does not preclude SARS-Cov-2 infection and should not be used as the sole basis for treatment or other patient management decisions. A negative result may occur with  improper specimen collection/handling, submission of specimen other than nasopharyngeal swab, presence of viral mutation(s) within the areas targeted by this assay, and inadequate number of viral copies(<138 copies/mL). A negative result must be combined with clinical observations, patient history, and epidemiological information. The expected result is Negative.  Fact Sheet for Patients:  BloggerCourse.com  Fact Sheet for Healthcare Providers:  SeriousBroker.it  This test is no t yet approved or cleared by the [place] FDA and  has been authorized for detection and/or diagnosis of SARS-CoV-2 by FDA under an Emergency Use Authorization (EUA). This EUA will remain  in effect (meaning this test can be used) for the duration of the COVID-19 declaration under Section 564(b)(1) of the Act,  21 U.S.C.section 360bbb-3(b)(1), unless the authorization is terminated  or revoked sooner.       Influenza A by PCR NEGATIVE NEGATIVE Final   Influenza B by PCR NEGATIVE NEGATIVE Final    Comment: (NOTE) The Xpert Xpress SARS-CoV-2/FLU/RSV plus assay is intended as an aid in the diagnosis of influenza from Nasopharyngeal swab specimens and should not be used as a sole basis for treatment. Nasal washings and aspirates are unacceptable for Xpert Xpress SARS-CoV-2/FLU/RSV testing.  Fact Sheet for Patients: BloggerCourse.com  Fact Sheet for Healthcare Providers: SeriousBroker.it  This test is not yet approved or cleared by the [place] FDA and has been authorized for detection and/or diagnosis of SARS-CoV-2 by FDA under an Emergency Use Authorization (EUA). This EUA will remain in effect (meaning this test can be used) for the duration of the COVID-19 declaration under Section 564(b)(1) of the Act, 21 U.S.C. section 360bbb-3(b)(1), unless the authorization is terminated or revoked.  Performed at [facility], [address] [place], [place] [address]   MRSA PCR Screening     Status: None   Collection Time: 05/14/20  3:02 PM   Specimen: Nasopharyngeal  Result Value Ref Range Status   MRSA by PCR NEGATIVE NEGATIVE Final    Comment:        The GeneXpert MRSA Assay (FDA approved for NASAL specimens only), is one component of a comprehensive MRSA colonization surveillance program. It is not intended to diagnose MRSA infection nor to guide or monitor treatment for MRSA infections. Performed at [facility], [address], [place], [place] [address]      Time coordinating discharge: Over 30 minutes  SIGNED:   Tresa Moore, MD  [facility] 05/16/2020, 9:57 AM Pager   If 7PM-7AM, please  contact night-coverage

## 2020-05-16 NOTE — Interval H&P Note (Signed)
Cath Lab Visit (complete for each Cath Lab visit)  Clinical Evaluation Leading to the Procedure:   ACS: Yes.    Non-ACS:  n/a    History and Physical Interval Note:  05/16/2020 7:54 AM  Matthew Tapia  has presented today for surgery, with the diagnosis of inferior STEMI.  The various methods of treatment have been discussed with the patient and family. After consideration of risks, benefits and other options for treatment, the patient has consented to  Procedure(s): LEFT HEART CATH AND CORONARY ANGIOGRAPHY (N/A) as a surgical intervention.  The patient's history has been reviewed, patient examined, no change in status, stable for surgery.  I have reviewed the patient's chart and labs.  Questions were answered to the patient's satisfaction.     Lorine Bears

## 2020-05-16 NOTE — Progress Notes (Signed)
Neuro: stable at baseline, needs some reminding regarding status and plan Resp: stable on room air CV: afebrile, vital signs stable, cath site dressing clean dry and intact, pulses and cap refill stable GIGU: tolerating PO well, voiding in urinal, BM following cath, no emesis Skin: clean dry and intact, unable to assess cath site Social: daughter visited throughout the day time, all questions and concerns addressed, spoke with "wife" and granddaughter over the phone, update given, all questions and concerns addressed.   Events: Pt went to cath lab today, no complications. Plan is to transfer to [facility] in [place] for CABG on Monday.

## 2020-05-16 NOTE — H&P (Signed)
Cardiology Admission History and Physical:   Patient ID: Matthew Tapia MRN: [id number]; DOB: 10/02/1965   Admission date: (Not on file)  Primary Care Provider: Patient, No Pcp Per [facility] Cardiologist: [facility], Dr. Cathey [facility] Electrophysiologist:  None   Chief Complaint: Multivessel CAD, evaluation for CABG  Patient Profile:   Matthew Tapia is a 54 y.o. male with history of 05/14/2020 late presenting inferior ST elevation myocardial infarction with subsequent cardiac catheterization showing multivessel CAD as below, polysubstance use including tobacco use, anxiety, and being transferred from [facility] to [facility] for evaluation for CABG.  History of Present Illness:   Matthew Tapia is a 54 year old male with no previously known cardiac history prior to his most recent presentation to [facility] ED 05/14/2020.  He is a current smoker.    He reported intermittent chest pain for the few weeks leading up to his ED visit.  CP was described as tightness with associated shortness of breath.  He attributed his CP episodes to his anxiety and did not seek medical attention.  He had worsening CP and shortness of breath over the 2 days leading up to his ED visit.  It was noted that his mother thought he looked pale and was having difficulty breathing and called EMS.  EMS arrived and performed an EKG which showed inferior Q waves with inferior ST elevation and reciprocal changes in the anterior leads.    He was brought to the emergency department with report of 5/10 chest pain and shortness of breath.  Vital signs stable.  He was started on ASA and heparin.  He reported improvement of chest pain.  Given his symptoms and EKG changes, emergent left heart catheterization with possible PCI was performed.    LHC 05/14/2020 showed occluded RCA with left-to-right collaterals and high-grade stenosis of the mid left circumflex.  RCA was full thrombus from proximal to distal segment.  Aspiration  thrombectomy and balloon angioplasty performed.  Not all of the thrombus was able to be cleared and it was felt overlapping stents would be too risky due to risk of distal embolization, shutting down the entire distal RCA.  He was started on Aggrastat infusion with recommendation to continue for 48 hours with relook angiography planned for 05/16/2020.  Plan was for tentative staged left circumflex PCI.    12/21 UDS performed and positive for amphetamines, cocaine, and benzodiazepine.  Echo 05/15/2020 showed EF reduced 35 to 40%, moderately reduced LV function, regional wall motion abnormalities, G1 DD, severe hypokinesis of the left ventricle, entire inferior lateral wall, and inferior wall.  GLS -11.6% and abnormal.  Subsequent 12/23 LHC showed diffuse RCA disease and presence of 3V CAD with recommendation for evaluation for CABG due to severe 3 VD CAD. Significant improvement noted of thrombus burden in RCA but diffuse disease from the proximal to distal segment before the bifurcation.  He also was noted to have proximal to mid LAD disease which was highly significant by FFR.  Complex mid left circumflex disease was noted at the bifurcation of the OM 2.  There is also subtotal occlusion of the first diagonal. Please see catheterization report as below.    Brilinta discontinued.  Given his residual thrombus in the RCA, it was recommended he continue Aggrastat infusion until the time of his CABG.  Tentative plan was noted for CABG to be performed early next week.  [facility] hospital/heart care team contacted and aware of the transfer.  [facility] called and internal medicine notified to  complete discharge summary and EMTALA form.  Most recent labs show stable renal function and electrolytes. HS Tn 4593.  BNP 534.9.  Total cholesterol 121 with LDL 64.  Hemoglobin 12.8, hematocrit 38.9.  Hemoglobin A1c 6.0. Vitals stable.   Past Medical History:  Diagnosis Date  . Anxiety   . Tobacco abuse     Past  Surgical History:  Procedure Laterality Date  . CORONARY/GRAFT ACUTE MI REVASCULARIZATION N/A 05/14/2020   Procedure: Coronary/Graft Acute MI Revascularization;  Surgeon: Iran Ouch, MD;  Location: [facility] INVASIVE CV LAB;  Service: Cardiovascular;  Laterality: N/A;  . LEFT HEART CATH AND CORONARY ANGIOGRAPHY N/A 05/14/2020   Procedure: LEFT HEART CATH AND CORONARY ANGIOGRAPHY;  Surgeon: Iran Ouch, MD;  Location: [facility] INVASIVE CV LAB;  Service: Cardiovascular;  Laterality: N/A;  . SHOULDER SURGERY       Medications Prior to Admission: Prior to Admission medications   Medication Sig Start Date End Date Taking? Authorizing Provider  albuterol (PROVENTIL HFA;VENTOLIN HFA) 108 (90 Base) MCG/ACT inhaler Inhale 1-2 puffs into the lungs every 6 (six) hours as needed for wheezing or shortness of breath. 07/11/15   Hagler, Jami L, PA-C  albuterol (VENTOLIN HFA) 108 (90 Base) MCG/ACT inhaler Inhale 2 puffs into the lungs every 6 (six) hours as needed for wheezing or shortness of breath. 04/21/19   Willy Eddy, MD  amoxicillin (AMOXIL) 875 MG tablet Take 1 tablet (875 mg total) by mouth 2 (two) times daily. 07/11/15   Hagler, Jami L, PA-C  brompheniramine-pseudoephedrine-DM 30-2-10 MG/5ML syrup Take 10 mLs by mouth 4 (four) times daily as needed. 07/11/15   Hagler, Jami L, PA-C  fluticasone (FLONASE) 50 MCG/ACT nasal spray Place 2 sprays into both nostrils daily. 07/11/15   Hagler, Jami L, PA-C  LORazepam (ATIVAN) 0.5 MG tablet Take 1 tablet (0.5 mg total) by mouth every 8 (eight) hours as needed for anxiety. 02/09/20 02/08/21  Jene Every, MD  predniSONE (DELTASONE) 10 MG tablet Take 1 tablet (10 mg total) by mouth daily. Day 1-2: Take 50 mg  ( 5 pills) Day 3-4 : Take 40 mg (4pills) Day 5-6: Take 30 mg (3 pills) Day 7-8:  Take 20 mg (2 pills) Day 9:  Take  (1 pill) 04/21/19   Willy Eddy, MD  sulfamethoxazole-trimethoprim (BACTRIM DS) 800-160 MG tablet Take 1 tablet by mouth 2 (two)  times daily. 07/29/19   Enid Derry, PA-C     Allergies:   No Known Allergies  Social History:   Social History   Socioeconomic History  . Marital status: Married    Spouse name: Not on file  . Number of children: Not on file  . Years of education: Not on file  . Highest education level: Not on file  Occupational History  . Not on file  Tobacco Use  . Smoking status: Current Every Day Smoker    Types: Cigarettes  . Smokeless tobacco: Never Used  Substance and Sexual Activity  . Alcohol use: No  . Drug use: Never  . Sexual activity: Not on file  Other Topics Concern  . Not on file  Social History Narrative  . Not on file   Social Determinants of Health   Financial Resource Strain: Not on file  Food Insecurity: Not on file  Transportation Needs: Not on file  Physical Activity: Not on file  Stress: Not on file  Social Connections: Not on file  Intimate Partner Violence: Not on file    Family History:   The  patient's family history includes Heart attack in his father.    ROS:  Please see the history of present illness.  See HPI. All other ROS reviewed and negative.     Physical Exam/Data:  There were no vitals filed for this visit. No intake or output data in the 24 hours ending 05/16/20 1033 Last 3 Weights 05/16/2020 05/15/2020 05/14/2020  Weight (lbs) 197 lb 8.5 oz 197 lb 8.5 oz 201 lb 15.1 oz  Weight (kg) 89.6 kg 89.6 kg 91.6 kg     There is no height or weight on file to calculate BMI.  Physical exam per MD   EKG:  EKG showed inferior Q waves with inferior ST elevation and reciprocal changes in the anterior leads.  Relevant CV Studies: LHC 05/16/20  Prox LAD to Mid LAD lesion is 70% stenosed.  Mid LAD to Dist LAD lesion is 40% stenosed.  Mid Cx to Dist Cx lesion is 95% stenosed.  2nd Mrg lesion is 80% stenosed.  Prox RCA to Dist RCA lesion is 80% stenosed.  1st Diag lesion is 99% stenosed. 1.  Severe three-vessel coronary artery disease.   Thrombus burden in the right coronary artery improved significantly in the vessel has normal TIMI-3 flow but has diffuse disease from the proximal to the distal segment before the bifurcation.  In addition, the patient seems to have significant proximal to mid LAD disease which was highly significant by fractional flow reserve evaluation with an IFR ratio of 0.76.  There is complex mid left circumflex disease at the bifurcation of OM 2.  There is also subtotal occlusion of first diagonal which seems to be diffusely diseased. 2.  Left ventricular angiography was not performed.  EF was 35 to 40% by echo. 3.  Significant improvement in left ventricular end-diastolic pressure which is currently only mildly elevated at 17 mmHg. Recommendations: Given diffuse disease in the right coronary artery and the presence of three-vessel coronary artery disease, recommend evaluation for CABG. I am going to discontinue treatment with Brilinta. Given residual thrombus in the right coronary artery, recommend continuing Aggrastat infusion until CABG. Suspect that CABG can be done early next week. I will make arrangements for transfer to [facility].  Echo 05/15/20 1. Left ventricular ejection fraction, by estimation, is 35 to 40%. The  left ventricle has moderately decreased function. The left ventricle  demonstrates regional wall motion abnormalities (see scoring  diagram/findings for description). Left ventricular  diastolic parameters are consistent with Grade I diastolic dysfunction  (impaired relaxation). There is severe hypokinesis of the left  ventricular, entire inferolateral wall and inferior wall. The average left  ventricular global longitudinal strain is  -11.6 %. The global longitudinal strain is abnormal.  2. Right ventricular systolic function is normal. The right ventricular  size is normal.  3. The mitral valve is normal in structure. No evidence of mitral valve  regurgitation.  4.  The aortic valve was not well visualized. Aortic valve regurgitation  is not visualized.  5. The inferior vena cava is normal in size with greater than 50%  respiratory variability, suggesting right atrial pressure of 3 mmHg.   LHC 05/14/20  Prox LAD to Mid LAD lesion is 70% stenosed.  Mid LAD to Dist LAD lesion is 40% stenosed.  Mid Cx to Dist Cx lesion is 95% stenosed.  2nd Mrg lesion is 80% stenosed.  Prox RCA to Dist RCA lesion is 80% stenosed.  1st Diag lesion is 99% stenosed. 1.  Severe three-vessel coronary  artery disease.  Thrombus burden in the right coronary artery improved significantly in the vessel has normal TIMI-3 flow but has diffuse disease from the proximal to the distal segment before the bifurcation.  In addition, the patient seems to have significant proximal to mid LAD disease which was highly significant by fractional flow reserve evaluation with an IFR ratio of 0.76.  There is complex mid left circumflex disease at the bifurcation of OM 2.  There is also subtotal occlusion of first diagonal which seems to be diffusely diseased. 2.  Left ventricular angiography was not performed.  EF was 35 to 40% by echo. 3.  Significant improvement in left ventricular end-diastolic pressure which is currently only mildly elevated at 17 mmHg. Recommendations: Given diffuse disease in the right coronary artery and the presence of three-vessel coronary artery disease, recommend evaluation for CABG. I am going to discontinue treatment with Brilinta. Given residual thrombus in the right coronary artery, recommend continuing Aggrastat infusion until CABG. Suspect that CABG can be done early next week. I will [facility] .   Laboratory Data:  High Sensitivity Troponin:   Recent Labs  Lab 05/14/20 1256 05/14/20 1510 05/15/20 0322  TROPONINIHS 1,815* 1,989* 4,593*      Chemistry Recent Labs  Lab 05/14/20 1256 05/14/20 1510  05/15/20 0322  NA 136  --  141  K 4.1  --  3.9  CL 104  --  109  CO2 22  --  24  GLUCOSE 138*  --  115*  BUN 7  --  9  CREATININE 0.66 0.69 0.73  CALCIUM 9.0  --  8.5*  GFRNONAA >60 >60 >60  ANIONGAP 10  --  8    No results for input(s): PROT, ALBUMIN, AST, ALT, ALKPHOS, BILITOT in the last 168 hours. Hematology Recent Labs  Lab 05/14/20 1510 05/15/20 0322  WBC 12.2* 13.0*  RBC 4.15* 4.07*  HGB 13.2 12.8*  HCT 39.5 38.9*  MCV 95.2 95.6  MCH 31.8 31.4  MCHC 33.4 32.9  RDW 12.4 12.3  PLT 347 392   BNP Recent Labs  Lab 05/14/20 1249  BNP 534.9*    DDimer No results for input(s): DDIMER in the last 168 hours.   Radiology/Studies:  CARDIAC CATHETERIZATION  Result Date: 05/16/2020  Prox LAD to Mid LAD lesion is 70% stenosed.  Mid LAD to Dist LAD lesion is 40% stenosed.  Mid Cx to Dist Cx lesion is 95% stenosed.  2nd Mrg lesion is 80% stenosed.  Prox RCA to Dist RCA lesion is 80% stenosed.  1st Diag lesion is 99% stenosed.  1.  Severe three-vessel coronary artery disease.  Thrombus burden in the right coronary artery improved significantly in the vessel has normal TIMI-3 flow but has diffuse disease from the proximal to the distal segment before the bifurcation.  In addition, the patient seems to have significant proximal to mid LAD disease which was highly significant by fractional flow reserve evaluation with an IFR ratio of 0.76.  There is complex mid left circumflex disease at the bifurcation of OM 2.  There is also subtotal occlusion of first diagonal which seems to be diffusely diseased. 2.  Left ventricular angiography was not performed.  EF was 35 to 40% by echo. 3.  Significant improvement in left ventricular end-diastolic pressure which is currently only mildly elevated at 17 mmHg. Recommendations: Given diffuse disease in the right coronary artery and the presence of three-vessel coronary artery disease, recommend evaluation for CABG.  I am going to discontinue  treatment with Brilinta. Given residual thrombus in the right coronary artery, recommend continuing Aggrastat infusion until CABG. Suspect that CABG can be done early next week. I will make arrangements for transfer to [facility].     Assessment and Plan:    Late presenting inferior ST elevation myocardial infarction --EKG as above. HS Tn 4593.0. Emergent LHC performed via right radial artery with occluded RCA with L-R collaterals and high-grade stenosis of the mid left circumflex.  RCA was full thrombus from the proximal to distal segment.  Aspiration thrombectomy and balloon angioplasty performed.    Not all the thrombus was cleared and he was started on Aggrastat infusion with recommendation to continue for 48 hours with relook angiography on 05/16/2020.  LHC 12/23 showed severe 3 VD CAD and diffuse disease of RCA with recommendation for transfer to [facility] for evaluation for CABG.  Brilinta discontinued in the setting of CABG evaluation. Continue Aggrastat infusion until CABG. Continue initiated high-dose atorvastatin. Daily BMET and CBC.   Most recent renal function and electrolytes stable.  Hemoglobin 12.8 with stable hematocrit at 38.9.  Ischemic cardiomyopathy due to late presenting myocardial infarction --12/22 echo with EF 35 to 40%, regional wall motion abnormalities, and findings as copied and pasted above.    My cath, LVEDP significantly elevated.  Continue to diurese as needed. Continue carvedilol.  Consider addition of ACE/ARB before discharge if creatinine tolerates s/p contrast.   Tobacco use --Smoking cessation recommended cessation recommended.   Polysubstance use --UA positive for multiple substances, including amphetamines, benzodiazepines, and cocaine, and with cessation recommended.   HLD --Continue atorvastatin.  Elevated A1c --Per IM.  Most recent hemoglobin A1c 6.0.  Glycemic control recommended for risk factor modification.      TIMI Risk Score  for ST  Elevation MI:   The patient's TIMI risk score is 4, which indicates a 7.3% risk of all cause mortality at 30 days.   New York Heart Association (NYHA) Functional Class NYHA Class I   Severity of Illness: The appropriate patient status for this patient is OBSERVATION. Observation status is judged to be reasonable and necessary in order to provide the required intensity of service to ensure the patient's safety. The patient's presenting symptoms, physical exam findings, and initial radiographic and laboratory data in the context of their medical condition is felt to place them at decreased risk for further clinical deterioration. Furthermore, it is anticipated that the patient will be medically stable for discharge from the hospital within 2 midnights of admission. The following factors support the patient status of observation.   " The patient's presenting symptoms include inferior STEMI. " The physical exam findings include late presenting inferior STEMI. " The initial radiographic and laboratory data are late presenting inferior STEMI.     For questions or updates, please contact [facility] Please consult [email or web address] for contact info under     Signed, Lennon Alstrom, PA-C  05/16/2020 10:33 AM

## 2020-05-17 ENCOUNTER — Inpatient Hospital Stay (HOSPITAL_COMMUNITY)
Admission: AD | Admit: 2020-05-17 | Discharge: 2020-05-26 | DRG: 231 | Disposition: A | Discharge status: Home / Self Care | Payer: Self-pay | Source: Other Acute Inpatient Hospital | Attending: Thoracic Surgery (Cardiothoracic Vascular Surgery) | Admitting: Thoracic Surgery (Cardiothoracic Vascular Surgery)

## 2020-05-17 DIAGNOSIS — J9383 Other pneumothorax: Secondary | ICD-10-CM | POA: Diagnosis not present

## 2020-05-17 DIAGNOSIS — J9811 Atelectasis: Secondary | ICD-10-CM | POA: Diagnosis not present

## 2020-05-17 DIAGNOSIS — I493 Ventricular premature depolarization: Secondary | ICD-10-CM | POA: Diagnosis present

## 2020-05-17 DIAGNOSIS — E871 Hypo-osmolality and hyponatremia: Secondary | ICD-10-CM | POA: Diagnosis not present

## 2020-05-17 DIAGNOSIS — I110 Hypertensive heart disease with heart failure: Secondary | ICD-10-CM | POA: Diagnosis present

## 2020-05-17 DIAGNOSIS — Z9689 Presence of other specified functional implants: Secondary | ICD-10-CM

## 2020-05-17 DIAGNOSIS — R7303 Prediabetes: Secondary | ICD-10-CM | POA: Diagnosis present

## 2020-05-17 DIAGNOSIS — Z9861 Coronary angioplasty status: Secondary | ICD-10-CM

## 2020-05-17 DIAGNOSIS — I2119 ST elevation (STEMI) myocardial infarction involving other coronary artery of inferior wall: Principal | ICD-10-CM | POA: Diagnosis present

## 2020-05-17 DIAGNOSIS — F1490 Cocaine use, unspecified, uncomplicated: Secondary | ICD-10-CM | POA: Diagnosis present

## 2020-05-17 DIAGNOSIS — Z8249 Family history of ischemic heart disease and other diseases of the circulatory system: Secondary | ICD-10-CM

## 2020-05-17 DIAGNOSIS — R059 Cough, unspecified: Secondary | ICD-10-CM

## 2020-05-17 DIAGNOSIS — F1910 Other psychoactive substance abuse, uncomplicated: Secondary | ICD-10-CM

## 2020-05-17 DIAGNOSIS — I255 Ischemic cardiomyopathy: Secondary | ICD-10-CM | POA: Diagnosis present

## 2020-05-17 DIAGNOSIS — I25110 Atherosclerotic heart disease of native coronary artery with unstable angina pectoris: Secondary | ICD-10-CM

## 2020-05-17 DIAGNOSIS — I2510 Atherosclerotic heart disease of native coronary artery without angina pectoris: Secondary | ICD-10-CM | POA: Diagnosis present

## 2020-05-17 DIAGNOSIS — Z79899 Other long term (current) drug therapy: Secondary | ICD-10-CM

## 2020-05-17 DIAGNOSIS — G8929 Other chronic pain: Secondary | ICD-10-CM | POA: Diagnosis present

## 2020-05-17 DIAGNOSIS — Z951 Presence of aortocoronary bypass graft: Secondary | ICD-10-CM

## 2020-05-17 DIAGNOSIS — I5023 Acute on chronic systolic (congestive) heart failure: Secondary | ICD-10-CM | POA: Diagnosis present

## 2020-05-17 DIAGNOSIS — E785 Hyperlipidemia, unspecified: Secondary | ICD-10-CM | POA: Diagnosis present

## 2020-05-17 DIAGNOSIS — Q250 Patent ductus arteriosus: Secondary | ICD-10-CM

## 2020-05-17 DIAGNOSIS — Z09 Encounter for follow-up examination after completed treatment for conditions other than malignant neoplasm: Secondary | ICD-10-CM

## 2020-05-17 DIAGNOSIS — D62 Acute posthemorrhagic anemia: Secondary | ICD-10-CM | POA: Diagnosis not present

## 2020-05-17 DIAGNOSIS — Z720 Tobacco use: Secondary | ICD-10-CM | POA: Diagnosis present

## 2020-05-17 DIAGNOSIS — F419 Anxiety disorder, unspecified: Secondary | ICD-10-CM | POA: Diagnosis present

## 2020-05-17 DIAGNOSIS — R7309 Other abnormal glucose: Secondary | ICD-10-CM | POA: Insufficient documentation

## 2020-05-17 DIAGNOSIS — J939 Pneumothorax, unspecified: Secondary | ICD-10-CM

## 2020-05-17 DIAGNOSIS — D72829 Elevated white blood cell count, unspecified: Secondary | ICD-10-CM | POA: Diagnosis not present

## 2020-05-17 DIAGNOSIS — I340 Nonrheumatic mitral (valve) insufficiency: Secondary | ICD-10-CM | POA: Diagnosis present

## 2020-05-17 DIAGNOSIS — F1590 Other stimulant use, unspecified, uncomplicated: Secondary | ICD-10-CM | POA: Diagnosis present

## 2020-05-17 DIAGNOSIS — I5020 Unspecified systolic (congestive) heart failure: Secondary | ICD-10-CM

## 2020-05-17 DIAGNOSIS — I10 Essential (primary) hypertension: Secondary | ICD-10-CM

## 2020-05-17 DIAGNOSIS — F17210 Nicotine dependence, cigarettes, uncomplicated: Secondary | ICD-10-CM | POA: Diagnosis present

## 2020-05-17 DIAGNOSIS — Z01810 Encounter for preprocedural cardiovascular examination: Secondary | ICD-10-CM

## 2020-05-17 HISTORY — DX: Personal history of nicotine dependence: Z87.891

## 2020-05-17 LAB — BASIC METABOLIC PANEL
Anion gap: 9 (ref 5–15)
BUN: 12 mg/dL (ref 6–20)
CO2: 24 mmol/L (ref 22–32)
Calcium: 9.2 mg/dL (ref 8.9–10.3)
Chloride: 105 mmol/L (ref 98–111)
Creatinine, Ser: 0.58 mg/dL — ABNORMAL LOW (ref 0.61–1.24)
GFR, Estimated: >60 mL/min (ref >60)
Glucose, Bld: 103 mg/dL — ABNORMAL HIGH (ref 70–99)
Potassium: 4.1 mmol/L (ref 3.5–5.1)
Sodium: 138 mmol/L (ref 135–145)

## 2020-05-17 LAB — CBC
HCT: 43.5 % (ref 39.0–52.0)
Hemoglobin: 14.9 g/dL (ref 13.0–17.0)
MCH: 32.2 pg (ref 26.0–34.0)
MCHC: 34.3 g/dL (ref 30.0–36.0)
MCV: 94 fL (ref 80.0–100.0)
Platelets: 436 10*3/uL — ABNORMAL HIGH (ref 150–400)
RBC: 4.63 MIL/uL (ref 4.22–5.81)
RDW: 12.3 % (ref 11.5–15.5)
WBC: 13 10*3/uL — ABNORMAL HIGH (ref 4.0–10.5)
nRBC: 0 % (ref 0.0–0.2)

## 2020-05-17 LAB — HIV ANTIBODY (ROUTINE TESTING W REFLEX): HIV Screen 4th Generation wRfx: NONREACTIVE

## 2020-05-17 MED ORDER — ATORVASTATIN CALCIUM 80 MG PO TABS
80.0000 mg | ORAL_TABLET | Freq: Every day | ORAL | Status: DC
  Administered 2020-05-17 – 2020-05-26 (×9): 80 mg via ORAL
  Filled 2020-05-17 (×9): qty 1

## 2020-05-17 MED ORDER — LOSARTAN POTASSIUM 25 MG PO TABS
25.0000 mg | ORAL_TABLET | Freq: Every day | ORAL | Status: DC
  Administered 2020-05-17 – 2020-05-20 (×4): 25 mg via ORAL
  Filled 2020-05-17 (×4): qty 1

## 2020-05-17 MED ORDER — NICOTINE 21 MG/24HR TD PT24
21.0000 mg | MEDICATED_PATCH | Freq: Every day | TRANSDERMAL | Status: DC
  Filled 2020-05-17 (×2): qty 1

## 2020-05-17 MED ORDER — NITROGLYCERIN 0.4 MG SL SUBL
0.4000 mg | SUBLINGUAL_TABLET | SUBLINGUAL | Status: DC | PRN
  Administered 2020-05-20 (×3): 0.4 mg via SUBLINGUAL
  Filled 2020-05-17 (×3): qty 1

## 2020-05-17 MED ORDER — TIROFIBAN HCL IN NACL 5-0.9 MG/100ML-% IV SOLN
0.1500 ug/kg/min | INTRAVENOUS | Status: AC
  Administered 2020-05-17 – 2020-05-19 (×9): 0.15 ug/kg/min via INTRAVENOUS
  Filled 2020-05-17 (×9): qty 100

## 2020-05-17 MED ORDER — LORAZEPAM 1 MG PO TABS
1.0000 mg | ORAL_TABLET | Freq: Once | ORAL | Status: AC
  Administered 2020-05-17: 1 mg via ORAL
  Filled 2020-05-17: qty 1

## 2020-05-17 MED ORDER — ACETAMINOPHEN 325 MG PO TABS: 650.0000 mg | ORAL_TABLET | ORAL | Status: DC | PRN

## 2020-05-17 MED ORDER — CARVEDILOL 3.125 MG PO TABS
3.1250 mg | ORAL_TABLET | Freq: Two times a day (BID) | ORAL | Status: DC
  Administered 2020-05-17 – 2020-05-20 (×7): 3.125 mg via ORAL
  Filled 2020-05-17 (×7): qty 1

## 2020-05-17 MED ORDER — ONDANSETRON HCL 4 MG/2ML IJ SOLN: 4.0000 mg | Freq: Four times a day (QID) | INTRAMUSCULAR | Status: DC | PRN

## 2020-05-17 MED ORDER — ASPIRIN EC 81 MG PO TBEC
81.0000 mg | DELAYED_RELEASE_TABLET | Freq: Every day | ORAL | Status: DC
  Administered 2020-05-18 – 2020-05-20 (×3): 81 mg via ORAL
  Filled 2020-05-17 (×3): qty 1

## 2020-05-17 NOTE — Progress Notes (Signed)
Progress Note  Patient Name: Matthew Tapia Date of Encounter: 05/17/2020  [facility] Cardiologist: Lorine Bears, MD   Subjective   Sleeping comfortably, no chest pain, no acute events overnight.  Waiting for transfer to [facility] upon bed availability.  Inpatient Medications    Scheduled Meds: . aspirin  81 mg Oral Daily  . atorvastatin  80 mg Oral Daily  . benzonatate  200 mg Oral TID  . carvedilol  3.125 mg Oral BID WC  . Chlorhexidine Gluconate Cloth  6 each Topical Daily  . enoxaparin (LOVENOX) injection  0.5 mg/kg Subcutaneous Q24H  . losartan  25 mg Oral Daily  . nicotine  21 mg Transdermal Daily  . sodium chloride flush  3 mL Intravenous Q12H  . sodium chloride flush  3 mL Intravenous Q12H  . sodium chloride flush  3 mL Intravenous Q12H   Continuous Infusions: . sodium chloride    . sodium chloride 10 mL/hr at 05/17/20 0800  . tirofiban 0.15 mcg/kg/min (05/17/20 0800)   PRN Meds: sodium chloride, sodium chloride, acetaminophen, albuterol, dextromethorphan-guaiFENesin, LORazepam, morphine injection, nitroGLYCERIN, ondansetron (ZOFRAN) IV, sodium chloride flush, sodium chloride flush   Vital Signs    Vitals:   05/17/20 0700 05/17/20 0800 05/17/20 0900 05/17/20 1000  BP: 123/80  112/72 108/71  Pulse: 80 82 85 79  Resp: 20 19 15  (!) 21  Temp:  (!) 97.4 F (36.3 C)    TempSrc:  Oral    SpO2: 94% 96% 91% 94%  Weight:      Height:        Intake/Output Summary (Last 24 hours) at 05/17/2020 1106 Last data filed at 05/17/2020 0800 Gross per 24 hour  Intake 510.49 ml  Output 3475 ml  Net -2964.51 ml   Last 3 Weights 05/16/2020 05/15/2020 05/14/2020  Weight (lbs) 197 lb 8.5 oz 197 lb 8.5 oz 201 lb 15.1 oz  Weight (kg) 89.6 kg 89.6 kg 91.6 kg      Telemetry    Sinus rhythm- Personally Reviewed  ECG    New tracing- Personally Reviewed  Physical Exam   GEN: No acute distress.   Neck: No JVD Cardiac: RRR, no murmurs, rubs, or gallops.   Respiratory: Clear to auscultation bilaterally. GI: Soft, nontender, non-distended  MS: No edema; No deformity. Neuro:  Nonfocal  Psych: Normal affect   Labs    High Sensitivity Troponin:   Recent Labs  Lab 05/14/20 1256 05/14/20 1510 05/15/20 0322  TROPONINIHS 1,815* 1,989* 4,593*      Chemistry Recent Labs  Lab 05/14/20 1256 05/14/20 1510 05/15/20 0322 05/17/20 0429  NA 136  --  141 138  K 4.1  --  3.9 4.1  CL 104  --  109 105  CO2 22  --  24 24  GLUCOSE 138*  --  115* 103*  BUN 7  --  9 12  CREATININE 0.66 0.69 0.73 0.58*  CALCIUM 9.0  --  8.5* 9.2  GFRNONAA >60 >60 >60 >60  ANIONGAP 10  --  8 9     Hematology Recent Labs  Lab 05/14/20 1510 05/15/20 0322 05/17/20 0429  WBC 12.2* 13.0* 13.0*  RBC 4.15* 4.07* 4.63  HGB 13.2 12.8* 14.9  HCT 39.5 38.9* 43.5  MCV 95.2 95.6 94.0  MCH 31.8 31.4 32.2  MCHC 33.4 32.9 34.3  RDW 12.4 12.3 12.3  PLT 347 392 436*    BNP Recent Labs  Lab 05/14/20 1249  BNP 534.9*     DDimer  No results for input(s): DDIMER in the last 168 hours.   Radiology    CARDIAC CATHETERIZATION  Result Date: 05/16/2020  Prox LAD to Mid LAD lesion is 70% stenosed.  Mid LAD to Dist LAD lesion is 40% stenosed.  Mid Cx to Dist Cx lesion is 95% stenosed.  2nd Mrg lesion is 80% stenosed.  Prox RCA to Dist RCA lesion is 80% stenosed.  1st Diag lesion is 99% stenosed.  1.  Severe three-vessel coronary artery disease.  Thrombus burden in the right coronary artery improved significantly in the vessel has normal TIMI-3 flow but has diffuse disease from the proximal to the distal segment before the bifurcation.  In addition, the patient seems to have significant proximal to mid LAD disease which was highly significant by fractional flow reserve evaluation with an IFR ratio of 0.76.  There is complex mid left circumflex disease at the bifurcation of OM 2.  There is also subtotal occlusion of first diagonal which seems to be diffusely diseased.  2.  Left ventricular angiography was not performed.  EF was 35 to 40% by echo. 3.  Significant improvement in left ventricular end-diastolic pressure which is currently only mildly elevated at 17 mmHg. Recommendations: Given diffuse disease in the right coronary artery and the presence of three-vessel coronary artery disease, recommend evaluation for CABG. I am going to discontinue treatment with Brilinta. Given residual thrombus in the right coronary artery, recommend continuing Aggrastat infusion until CABG. Suspect that CABG can be done early next week. I will make arrangements for transfer to [facility].    Cardiac Studies   Echo 05/15/20 1. Left ventricular ejection fraction, by estimation, is 35 to 40%. The  left ventricle has moderately decreased function. The left ventricle  demonstrates regional wall motion abnormalities (see scoring  diagram/findings for description). Left ventricular  diastolic parameters are consistent with Grade I diastolic dysfunction  (impaired relaxation). There is severe hypokinesis of the left  ventricular, entire inferolateral wall and inferior wall. The average left  ventricular global longitudinal strain is  -11.6 %. The global longitudinal strain is abnormal.  2. Right ventricular systolic function is normal. The right ventricular  size is normal.  3. The mitral valve is normal in structure. No evidence of mitral valve  regurgitation.  4. The aortic valve was not well visualized. Aortic valve regurgitation  is not visualized.  5. The inferior vena cava is normal in size with greater than 50%  respiratory variability, suggesting right atrial pressure of 3 mmHg.   LHC 05/16/20  Prox LAD to Mid LAD lesion is 70% stenosed.  Mid LAD to Dist LAD lesion is 40% stenosed.  Mid Cx to Dist Cx lesion is 95% stenosed.  2nd Mrg lesion is 80% stenosed.  Prox RCA to Dist RCA lesion is 80% stenosed.  1st Diag lesion is 99% stenosed. 1.  Severe three-vessel coronary artery disease. Thrombus burden in the right coronary artery improved significantly in the vessel has normal TIMI-3 flow but has diffuse disease from the proximal to the distal segment before the bifurcation. In addition, the patient seems to have significant proximal to mid LAD disease which was highly significant by fractional flow reserve evaluation with an IFR ratio of 0.76. There is complex mid left circumflex disease at the bifurcation of OM 2. There is also subtotal occlusion of first diagonal which seems to be diffusely diseased. 2. Left ventricular angiography was not performed. EF was 35 to 40% by echo. 3. Significant improvement in  left ventricular end-diastolic pressure which is currently only mildly elevated at 17 mmHg.   Patient Profile     54 y.o. male history of tobacco use, anxiety presenting with chest pain and diagnosed with inferior ST elevated MI.  Left heart cath showed significant three-vessel disease.  Echo with moderately reduced ejection fraction.  Assessment & Plan    1.  Inferior STEMI, 3-vessel CAD -Currently chest pain-free -Left heart cath with significant RCA thrombus burden -Continue aspirin, Lipitor, tirofiban -Transfer to [facility] upon bed availability for CABG consideration  2.  Moderately reduced ejection fraction, EF 35 to 40% -He appears euvolemic, net -2.5 L over the past 24 hours -Coreg, losartan -Monitor ins and outs for net even to net negative fluid balance -Consider diuretic/Lasix if he develops shortness of breath or volume overloaded.  3.  Smoking -Cessation advised  Total encounter time 35 minutes  Greater than 50% was spent in counseling and coordination of care with the patient     Signed, Debbe Odea, MD  05/17/2020, 11:06 AM

## 2020-05-17 NOTE — Progress Notes (Signed)
Brief hospitalist update note.  Nonbillable note  Patient was seen and examined this morning.  Sleeping comfortably, no distress.  No chest pain.  Patient awaiting transfer to [facility].  Suspected bed availability today.  Remains on Aggrastat infusion.  This will continue post discharge.  Care to be assumed by CABG team at [facility].  Patient was seen by inpatient cardiology consultant at [facility] today.  See discharge summary dated 05/16/2020 for full hospital course details.  Lolita Patella MD

## 2020-05-17 NOTE — Consult Note (Addendum)
[address]       [place] [address]             [phone]        Siddarth D Riggi Bucks Medical Record #[id number] Date of Birth: February 13, 1966  Referring: No ref. provider found Primary Care: Patient, No Pcp Per Primary Cardiologist:Muhammad Fletcher Anon, MD  Chief Complaint:  Chest pain   History of Present Illness:     [name] is a 54 year old male with a past history significant for hypertension, dyslipidemia, and tobacco use who presented to the [facility] ED a few days ago.  He gave a history of having chest pain associated with shortness of breath of about 2 weeks duration that had progressed in severity over the previous 2 days.  In the ED, an EKG showed inferior Q waves and inferior ST segment elevations.  His initial high-sensitivity troponin was 1800.  He was given aspirin and started on a heparin infusion.  He was taken to the Cath Lab urgently on 05/14/2021 where left heart catheterization demonstrated occlusion of the right coronary artery with extensive thrombus throughout its entire length.  There were left to right collaterals.  There is also high-grade stenosis in the mid circumflex coronary artery.  Aspiration thrombectomy was performed to the RCA in the Cath Lab with some demonstrable clearing of thrombus but the results were suboptimal.  The patient was started on Aggrastat infusion.  He was also loaded with Brilinta.  He was admitted to the hospital and remained stable.  He was returned to the Cath Lab on 05/16/2020.  Repeat left heart catheterization showed some improvement in the right coronary perfusion from TIMI II flow to TIMI-3 flow.  There was also noted the previously demonstrated circumflex disease as well as the significant LAD stenosis and occlusion of the diagonal.  No further intervention was carried out.  The Brilinta was discontinued in favor of preparing patient for coronary bypass grafting.  He was transferred  from [facility] hospital to [facility] today for CT surgery evaluation for possible coronary bypass grafting. Urine drug screen was obtained at [facility] as part of the hospital work-up and was positive for cocaine, amphetamines, and benzodiazepines.  Admission hemoglobin A1c was elevated at 6.0.  An echocardiogram was obtained on 05/15/20 that is remarkable for ejection fraction of 35 to 40% with severe inferior and inferolateral wall hypokinesis.  There were no significant valvular abnormalities identified.  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  He has a daughter who lives in [place] that will be assisting him following discharge. He has full plate upper and lower dentures.  He denies any trauma to the chest.  He is right-handed.  He has no history of lower extremity venous stasis or varicosities.    Zubrod Score: At the time of surgery this patient's most appropriate activity status/level should be described as: '[]'     0    Normal activity, no symptoms '[x]'     1    Restricted in physical strenuous activity but ambulatory, able to do out light work '[]'     2    Ambulatory and capable of self care, unable to do work activities, up and about                 more than 50%  Of the time                            '[]'   3    Only limited self care, in bed greater than 50% of waking hours '[]'     4    Completely disabled, no self care, confined to bed or chair '[]'     5    Moribund  Past Medical History:  Diagnosis Date  . Anxiety   . CAD, multiple vessel   . Dyslipidemia, goal LDL below 70   . Former smoker    Quit 2021  . HFrEF (heart failure with reduced ejection fraction) ([place])   . Polysubstance abuse ([place])    UDS positive for cocaine, amphetamines, benzos 05/15/20  . ST elevation myocardial infarction (STEMI) of inferior wall [facility])     Past Surgical History:   Procedure Laterality Date  . CORONARY/GRAFT ACUTE MI REVASCULARIZATION N/A 05/14/2020   Procedure: Coronary/Graft Acute MI Revascularization;  Surgeon: Wellington Hampshire, MD;  Location: [place] CV [facility];  Service: Cardiovascular;  Laterality: N/A;  . INTRAVASCULAR PRESSURE WIRE/FFR STUDY N/A 05/16/2020   Procedure: INTRAVASCULAR PRESSURE WIRE/FFR STUDY;  Surgeon: Wellington Hampshire, MD;  Location: [place] [facility];  Service: Cardiovascular;  Laterality: N/A;  . LEFT HEART CATH AND CORONARY ANGIOGRAPHY N/A 05/14/2020   Procedure: LEFT HEART CATH AND CORONARY ANGIOGRAPHY;  Surgeon: Wellington Hampshire, MD;  Location: [place] [facility];  Service: Cardiovascular;  Laterality: N/A;  . LEFT HEART CATH AND CORONARY ANGIOGRAPHY N/A 05/16/2020   Procedure: LEFT HEART CATH AND CORONARY ANGIOGRAPHY;  Surgeon: Wellington Hampshire, MD;  Location: [place] [facility];  Service: Cardiovascular;  Laterality: N/A;  . SHOULDER SURGERY      Social History   Tobacco Use  Smoking Status Current Every Day Smoker  . Types: Cigarettes  Smokeless Tobacco Never Used    Social History   Substance and Sexual Activity  Alcohol Use No     No Known Allergies  Current Facility-Administered Medications  Medication Dose Route Frequency Provider Last Rate Last Admin  . acetaminophen (TYLENOL) tablet 650 mg  650 mg Oral Q4H PRN Tommie Raymond, NP      . Derrill Memo ON 05/18/2020] aspirin EC tablet 81 mg  81 mg Oral Daily Kathyrn Drown D, NP      . atorvastatin (LIPITOR) tablet 80 mg  80 mg Oral Daily Kathyrn Drown D, NP      . carvedilol (COREG) tablet 3.125 mg  3.125 mg Oral BID WC Kathyrn Drown D, NP      . losartan (COZAAR) tablet 25 mg  25 mg Oral Daily Kathyrn Drown D, NP      . nicotine (NICODERM CQ - dosed in mg/24 hours) patch 21 mg  21 mg Transdermal Daily Kathyrn Drown D, NP      . nitroGLYCERIN (NITROSTAT) SL tablet 0.4 mg  0.4 mg Sublingual Q5 Min x 3 PRN Kathyrn Drown D, NP      .  ondansetron [facility]) injection 4 mg  4 mg Intravenous Q6H PRN Kathyrn Drown D, NP      . tirofiban (AGGRASTAT) infusion 50 mcg/mL 100 mL  0.15 mcg/kg/min Intravenous Continuous Cheryln Manly, NP        Medications Prior to Admission  Medication Sig Dispense Refill Last Dose  . albuterol (PROVENTIL HFA;VENTOLIN HFA) 108 (90 Base) MCG/ACT inhaler Inhale 1-2 puffs into the lungs every 6 (six) hours as needed for wheezing or shortness of breath. 1 Inhaler 0   . albuterol (VENTOLIN HFA) 108 (90 Base) MCG/ACT inhaler Inhale 2 puffs into the lungs every 6 (six) hours  as needed for wheezing or shortness of breath. 8 g 1   . amoxicillin (AMOXIL) 875 MG tablet Take 1 tablet (875 mg total) by mouth 2 (two) times daily. 20 tablet 0   . amoxicillin-clavulanate (AUGMENTIN) 875-125 MG tablet Take 1 tablet by mouth 2 (two) times daily. 10 day supply     . brompheniramine-pseudoephedrine-DM 30-2-10 MG/5ML syrup Take 10 mLs by mouth 4 (four) times daily as needed. 200 mL 0   . fluticasone (FLONASE) 50 MCG/ACT nasal spray Place 2 sprays into both nostrils daily. 16 g 0   . LORazepam (ATIVAN) 0.5 MG tablet Take 1 tablet (0.5 mg total) by mouth every 8 (eight) hours as needed for anxiety. 15 tablet 0   . predniSONE (DELTASONE) 10 MG tablet Take 1 tablet (10 mg total) by mouth daily. Day 1-2: Take 50 mg  ( 5 pills) Day 3-4 : Take 40 mg (4pills) Day 5-6: Take 30 mg (3 pills) Day 7-8:  Take 20 mg (2 pills) Day 9:  Take 60m (1 pill) 29 tablet 0   . sulfamethoxazole-trimethoprim (BACTRIM DS) 800-160 MG tablet Take 1 tablet by mouth 2 (two) times daily. 20 tablet 0     Family History  Problem Relation Age of Onset  . Heart attack Father      Review of Systems:     Cardiac Review of Systems: Y or  [    ]= no  Chest Pain [  y  ]  Resting SOB [ y  ] Exertional SOB  [Blue.Reese ]  Orthopnea [  ]   Pedal Edema [   ]    Palpitations [  ] Syncope  [  ]   Presyncope [   ]  General Review of Systems: [Y] = yes [   ]=no Constitional: recent weight change [  ]; anorexia [  ]; fatigue [  ]; nausea [  ]; night sweats [  ]; fever [  ]; or chills [  ]                                                               Dental: Last Dentist visit: Patient is edentulous  Eye : blurred vision [  ]; diplopia [   ]; vision changes [  ];  Amaurosis fugax[  ]; Resp: cough [  ];  wheezing[  ];  hemoptysis[  ]; shortness of breath[  ]; paroxysmal nocturnal dyspnea[  ]; dyspnea on exertion[  ]; or orthopnea[  ];  GI:  gallstones[  ], vomiting[  ];  dysphagia[  ]; melena[  ];  hematochezia [  ]; heartburn[  ];   Hx of  Colonoscopy[  ]; GU: kidney stones [  ]; hematuria[  ];   dysuria [  ];  nocturia[  ];  history of     obstruction [  ]; urinary frequency [  ]             Skin: rash, swelling[  ];, hair loss[  ];  peripheral edema[  ];  or itching[  ]; Musculosketetal: myalgias[  ];  joint swelling[  ];  joint erythema[  ];  joint pain[  ];  back pain[  ];  Heme/Lymph: bruising[  ];  bleeding[  ];  anemia[  ];  Neuro: TIA[  ];  headaches[  ];  stroke[  ];  vertigo[  ];  seizures[  ];   paresthesias[  ];  difficulty walking[  ];  Psych:depression[  ]; anxiety[ x ];  Endocrine: diabetes[  ];  thyroid dysfunction[  ];                Physical Exam: BP 126/89 (BP Location: Left Arm)   Temp 98.5 F (36.9 C) (Oral)   Resp 19   SpO2 98%    General appearance: alert, cooperative and no distress Head: Normocephalic, without obvious abnormality, atraumatic Neck: no adenopathy, no carotid bruit, no JVD, supple, symmetrical, trachea midline and thyroid not enlarged, symmetric, no tenderness/mass/nodules Lymph nodes: No cervical or clavicular adenopathy Resp: clear to auscultation bilaterally Cardio: regular rate and rhythm, S1, S2 normal, no murmur, click, rub or gallop GI: soft, non-tender; bowel sounds normal; no masses,  no organomegaly Extremities: extremities normal, atraumatic, no cyanosis or edema and All peripheral pulses  are easily palpable.   Modified Allen's test was performed on each hand using pulse oximetry.  On the right, he has loss of waveform at the thumb with occlusion of the radial artery.   On the left, there is no loss of pulsatile waveform to the thumb with occlusion of the radial artery. Neurologic: Grossly normal  Diagnostic Studies & Laboratory data:  Coronary/Graft Acute MI Revascularization  LEFT HEART CATH AND CORONARY ANGIOGRAPHY    05/14/20    Conclusion    There is moderate left ventricular systolic dysfunction.  LV end diastolic pressure is severely elevated.  Prox RCA to Dist RCA lesion is 100% stenosed.  Post intervention, there is a 80% residual stenosis.  Balloon angioplasty was performed using a BALLOON TREK RX 2.5X20.  Mid Cx to Dist Cx lesion is 95% stenosed.  2nd Mrg lesion is 80% stenosed.  Prox LAD to Mid LAD lesion is 30% stenosed.  Mid LAD to Dist LAD lesion is 40% stenosed.   1.  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  In addition, there is high-grade stenosis in the mid left circumflex at the bifurcation of OM 2.  The LAD has mild nonobstructive disease. 2.  Moderately reduced LV systolic function with an EF of 30 to 35% and severely elevated left ventricular end-diastolic pressure. 3.  Aspiration thrombectomy and balloon angioplasty was done to the right coronary artery with partial success.  I was able to establish TIMI II flow but not able to clear all the thrombus.  Recommendations: Continue Aggrastat infusion for 48 hours. Continue dual antiplatelet therapy with aspirin and ticagrelor. The plan is to relook angiography on Thursday to see if the right coronary artery is salvageable and also to perform a staged PCI on the left circumflex. I started small dose  carvedilol for cardiomyopathy. Diuresis as needed. High-dose atorvastatin. Smoking cessation is advised.  Coronary Findings   Diagnostic Dominance: Right  Left Main  Vessel is angiographically normal.  Left Anterior Descending  Prox LAD to Mid LAD lesion is 30% stenosed.  Mid LAD to Dist LAD lesion is 40% stenosed.  Left Circumflex  Mid Cx to Dist Cx lesion is 95% stenosed. The lesion is type C and located at the major branch.  Second Obtuse Marginal Branch  2nd Mrg lesion is 80% stenosed.  Left Posterior Atrioventricular Artery  The vessel  exhibits minimal luminal irregularities.  Right Coronary Artery  Prox RCA to Dist RCA lesion is 100% stenosed. Vessel is the culprit lesion. The lesion is type C and heavily thrombotic with left-to-right collateral flow. The lesion was not previously treated.  Right Posterior Descending Artery  Collaterals  RPDA filled by collaterals from Dist LAD.     Intervention   Prox RCA to Dist RCA lesion  Thrombectomy  CATH LAUNCHER 12FR JR4 guide catheter was inserted. WIRE RUNTHROUGH .[id number] guidewire was used to cross lesion. Aspiration thrombectomy performed using a CATH EXTRAC PRONTO LP 12F RND. 4 passes taken. Initially I aspirated the proximal segment but no significant thrombus was retrieved. After doing balloon angioplasty to the mid segment it was obvious that the clot extended all the way distally and thus I aspirated from the distal segment as well and was able to retrieve a large amount of thrombus.  Angioplasty  Lesion length: 120 mm. Balloon angioplasty was performed using a BALLOON TREK RX 2.5X20. Maximum pressure: 10 atm. Multiple inflations performed with a 2.5 balloon initially to the proximal and mid segment and then to the distal and mid segment to 8 atm and 10 atm. In spite of balloon angioplasty and aspiration thrombectomy, there was still significant residual thrombus in recall. Treating this with a stent would have required a  full metal jacket with high risk of distal embolization. After establishing TIMI II flow, I elected to stop after the patient was started on Aggrastat infusion. The plan is to infuse Aggrastat for 48 hours and relook angiography on Thursday.  Post-Intervention Lesion Assessment  The intervention was unsuccessful. Pre-interventional TIMI flow is 0. Post-intervention TIMI flow is 2. No complications occurred at this lesion.  There is a 80% residual stenosis post intervention.    Wall Motion  Resting                Left Heart  Left Ventricle The left ventricular size is normal. There is moderate left ventricular systolic dysfunction. LV end diastolic pressure is severely elevated. Calculated EF is 30%. There are LV function abnormalities due to segmental dysfunction.   Coronary Diagrams   Diagnostic Dominance: Right         INTRAVASCULAR PRESSURE WIRE/FFR STUDY  LEFT HEART CATH AND CORONARY ANGIOGRAPHY   05/16/20    Conclusion    Prox LAD to Mid LAD lesion is 70% stenosed.  Mid LAD to Dist LAD lesion is 40% stenosed.  Mid Cx to Dist Cx lesion is 95% stenosed.  2nd Mrg lesion is 80% stenosed.  Prox RCA to Dist RCA lesion is 80% stenosed.  1st Diag lesion is 99% stenosed.   1.  Severe three-vessel coronary artery disease.  Thrombus burden in the right coronary artery improved significantly in the vessel has normal TIMI-3 flow but has diffuse disease from the proximal to the distal segment before the bifurcation.  In addition, the patient seems to have significant proximal to mid LAD disease which was highly significant by fractional flow reserve evaluation with an IFR ratio of 0.76.  There is complex mid left circumflex disease at the bifurcation of OM 2.  There is also subtotal occlusion of first diagonal which seems to be diffusely diseased.  2.  Left ventricular angiography was not performed.  EF was 35 to 40% by echo. 3.  Significant improvement in left  ventricular end-diastolic pressure which is currently only mildly elevated at 17 mmHg.  Recommendations: Given diffuse disease in the right coronary artery and the  presence of three-vessel coronary artery disease, recommend evaluation for CABG. I am going to discontinue treatment with Brilinta. Given residual thrombus in the right coronary artery, recommend continuing Aggrastat infusion until CABG. Suspect that CABG can be done early next week. I will make arrangements for transfer to [facility].     Coronary Findings   Diagnostic Dominance: Right  Left Main  Vessel is angiographically normal.  Left Anterior Descending  Prox LAD to Mid LAD lesion is 70% stenosed. The lesion is type C. The lesion was not previously treated. Pressure wire/FFR was performed on the lesion. Fractional flow reserve evaluation was performed with a pressure wire in the standard fashion. IFR ratio was 0.76 which was highly significant.  Mid LAD to Dist LAD lesion is 40% stenosed.  First Diagonal Branch  Vessel is large in size.  1st Diag lesion is 99% stenosed.  Left Circumflex  Mid Cx to Dist Cx lesion is 95% stenosed. The lesion is type C and located at the major branch.  Second Obtuse Marginal Branch  2nd Mrg lesion is 80% stenosed.  Left Posterior Atrioventricular Artery  The vessel exhibits minimal luminal irregularities.  Right Coronary Artery  Prox RCA to Dist RCA lesion is 80% stenosed. Vessel is the culprit lesion. The lesion is type C, thrombotic and heavily thrombotic. The lesion was previously treated using angioplasty between 2-30 days ago.  Right Posterior Descending Artery   Intervention   No interventions have been documented.  Coronary Diagrams   Diagnostic Dominance: Right        ECHOCARDIOGRAM REPORT 05/15/20   Patient Name:  Matthew Tapia Date of Exam: 05/15/2020  Medical Rec #: [id number]   Height:    66.0 in  Accession #:  [id number]   Weight:     201.9 lb  Date of Birth: [birth year]   BSA:     2.008 m  Patient Age:  68 years    BP:      118/73 mmHg  Patient Gender: M       HR:      83 bpm.  Exam Location: [facility]   Procedure: 2D Echo, Color Doppler, Cardiac Doppler and Intracardiac       Opacification Agent   Indications:   I21.9 Acute myocardial infarction    History:     Patient has no prior history of Echocardiogram  examinations.          Signs/Symptoms:Chest Pain; Risk Factors:Current Smoker.    Sonographer:   Charmayne Sheer RDCS (AE)  Referring Phys: [place]  Diagnosing Phys: Kate Sable MD   IMPRESSIONS    1. Left ventricular ejection fraction, by estimation, is 35 to 40%. The  left ventricle has moderately decreased function. The left ventricle  demonstrates regional wall motion abnormalities (see scoring  diagram/findings for description). Left ventricular  diastolic parameters are consistent with Grade I diastolic dysfunction  (impaired relaxation). There is severe hypokinesis of the left  ventricular, entire inferolateral wall and inferior wall. The average left  ventricular global longitudinal strain is  -11.6 %. The global longitudinal strain is abnormal.  2. Right ventricular systolic function is normal. The right ventricular  size is normal.  3. The mitral valve is normal in structure. No evidence of mitral valve  regurgitation.  4. The aortic valve was not well visualized. Aortic valve regurgitation  is not visualized.  5. The inferior vena cava is normal in size with greater than 50%  respiratory variability, suggesting right atrial  pressure of 3 mmHg.   FINDINGS  Left Ventricle: Left ventricular ejection fraction, by estimation, is 35  to 40%. The left ventricle has moderately decreased function. The left  ventricle demonstrates regional wall motion abnormalities. Severe  hypokinesis of the left ventricular, entire   inferolateral wall and inferior wall. Definity contrast agent was given  IV to delineate the left ventricular endocardial borders. The average left  ventricular global longitudinal strain is -11.6 %. The global longitudinal  strain is abnormal. The left  ventricular internal cavity size was normal in size. There is no left  ventricular hypertrophy. Left ventricular diastolic parameters are  consistent with Grade I diastolic dysfunction (impaired relaxation).   Right Ventricle: The right ventricular size is normal. No increase in  right ventricular wall thickness. Right ventricular systolic function is  normal.   Left Atrium: Left atrial size was normal in size.   Right Atrium: Right atrial size was normal in size.   Pericardium: There is no evidence of pericardial effusion.   Mitral Valve: The mitral valve is normal in structure. No evidence of  mitral valve regurgitation. MV peak gradient, 4.8 mmHg. The mean mitral  valve gradient is 2.0 mmHg.   Tricuspid Valve: The tricuspid valve is normal in structure. Tricuspid  valve regurgitation is not demonstrated.   Aortic Valve: The aortic valve was not well visualized. Aortic valve  regurgitation is not visualized. Aortic valve mean gradient measures 6.0  mmHg. Aortic valve peak gradient measures 11.4 mmHg. Aortic valve area, by  VTI measures 2.87 cm.   Pulmonic Valve: The pulmonic valve was not well visualized. Pulmonic valve  regurgitation is not visualized.   Aorta: The aortic root is normal in size and structure.   Venous: The inferior vena cava is normal in size with greater than 50%  respiratory variability, suggesting right atrial pressure of 3 mmHg.   IAS/Shunts: No atrial level shunt detected by color flow Doppler.     LEFT VENTRICLE  PLAX 2D  LVIDd:     5.18 cm   Diastology  LVIDs:     3.78 cm   LV e' medial:  5.44 cm/s  LV PW:     1.02 cm   LV E/e' medial: 13.0  LV IVS:    0.90 cm    LV e' lateral:  7.94 cm/s  LVOT diam:   2.30 cm   LV E/e' lateral: 8.9  LV SV:     88  LV SV Index:  44      2D Longitudinal Strain  LVOT Area:   4.15 cm   2D Strain GLS Avg:   -11.6 %    LV Volumes (MOD)  LV vol d, MOD A2C: 115.0 ml  LV vol d, MOD A4C: 105.0 ml  LV vol s, MOD A2C: 42.8 ml  LV vol s, MOD A4C: 71.2 ml  LV SV MOD A2C:   72.2 ml  LV SV MOD A4C:   105.0 ml  LV SV MOD BP:   44.0 ml   RIGHT VENTRICLE  RV Basal diam: 2.48 cm  RV S prime:   13.40 cm/s  TAPSE (M-mode): 3.0 cm   LEFT ATRIUM       Index    RIGHT ATRIUM      Index  LA diam:    4.00 cm 1.99 cm/m RA Area:   14.40 cm  LA Vol (A2C):  40.5 ml 20.17 ml/m RA Volume:  32.40 ml 16.14 ml/m  LA Vol (A4C):  37.7  ml 18.77 ml/m  LA Biplane Vol: 39.8 ml 19.82 ml/m  AORTIC VALVE          PULMONIC VALVE  AV Area (Vmax):  2.78 cm   PV Vmax:    1.16 m/s  AV Area (Vmean):  2.68 cm   PV Vmean:   78.600 cm/s  AV Area (VTI):   2.87 cm   PV VTI:    0.200 m  AV Vmax:      169.00 cm/s PV Peak grad: 5.4 mmHg  AV Vmean:     116.000 cm/s PV Mean grad: 3.0 mmHg  AV VTI:      0.307 m  AV Peak Grad:   11.4 mmHg  AV Mean Grad:   6.0 mmHg  LVOT Vmax:     113.00 cm/s  LVOT Vmean:    74.900 cm/s  LVOT VTI:     0.212 m  LVOT/AV VTI ratio: 0.69    AORTA  Ao Root diam: 3.50 cm   MITRAL VALVE  MV Area (PHT): 4.31 cm  SHUNTS  MV Peak grad: 4.8 mmHg  Systemic VTI: 0.21 m  MV Mean grad: 2.0 mmHg  Systemic Diam: 2.30 cm  MV Vmax:    1.09 m/s  MV Vmean:   65.6 cm/s  MV Decel Time: 176 msec  MV E velocity: 70.50 cm/s  MV A velocity: 93.60 cm/s  MV E/A ratio: 0.75   Kate Sable MD  Electronically signed by Kate Sable MD  Signature Date/Time: 05/15/2020/2:54:38 PM     Recent Radiology Findings:      I have independently reviewed the above radiologic studies and  discussed with the patient   Recent Lab Findings: Lab Results  Component Value Date   WBC 13.0 (H) 05/17/2020   HGB 14.9 05/17/2020   HCT 43.5 05/17/2020   PLT 436 (H) 05/17/2020   GLUCOSE 103 (H) 05/17/2020   CHOL 121 05/15/2020   TRIG 161 (H) 05/15/2020   HDL 25 (L) 05/15/2020   LDLCALC 64 05/15/2020   ALT 21 04/21/2019   AST 22 04/21/2019   NA 138 05/17/2020   K 4.1 05/17/2020   CL 105 05/17/2020   CREATININE 0.58 (L) 05/17/2020   BUN 12 05/17/2020   CO2 24 05/17/2020   INR 1.0 05/14/2020   HGBA1C 6.0 (H) 05/15/2020      Assessment / Plan:    54 year old male with hypertension, dyslipidemia, longstanding history of heavy tobacco use, elevated hemoglobin A1c, and no prior history of coronary artery disease presented to [facility] days ago with acute ST elevation myocardial infarction.  He was taken to the Cath Lab urgently for left heart catheterization and possible intervention and was loaded with Brilinta.  He underwent aspiration thrombectomy of an occluded right coronary artery with suboptimal result.  He has been treated with Aggrastat since that intervention.  Relook catheterization yesterday showed some improvement and flow to the RCA from TIMI II to TIMI-3.  Additionally, he has significant obstructive disease in the LAD, diagonal, and circumflex coronary arteries.  Distal targets appear satisfactory for grafting.  He is currently pain-free and is comfortable.  Ejection fraction by echo was estimated at 35 to 40%.  He has no significant valvular dysfunction or structural abnormality.  We have been asked to evaluate Matthew Tapia for consideration of coronary bypass grafting.  The procedure and expectations regarding recovery were discussed with Matthew Tapia and his questions were answered.  He would like for [place] to proceed with work-up  and planning for surgery.  It appears the Brilinta was loaded on 05/14/2020 and discontinued on 05/16/2020.  We will need to  allow adequate time for this medication to clear. Dr. Mohammed Kindle will see Matthew Tapia and reviewed clinical data.  Timing of surgery will then be determined.    I  spent 25 minutes counseling the patient face to face.   Antony Odea, PA-C   05/17/2020 3:16 PM    Chart reviewed, patient examined, agree with above.    He reports a 1 year history of episodic shortness of breath and exertional fatigue which he said was diagnosed as anxiety for which she was taking Ativan.  He said that 10 days prior to presentation he had a very severe episode of substernal chest pain and EMS was called to his house.  ECG was apparently unremarkable and his pain resolved.  He was not taken to the emergency room.  That on Monday he had some stuttering chest discomfort and had more on Tuesday morning when he woke up which was not near as severe as the episode 10 days prior.  He was evaluated by EMS and noted to have worrisome ECG changes and was taken to [facility] regional.  Cardiac catheterization showed occlusion of the RCA with diffuse acute thrombus that was treated with thrombectomy on 05/14/2020.  He was treated with Aggrastat and loaded with Brilinta.  Repeat catheterization on 05/16/2020 showed improved flow down the diffusely diseased RCA with thrombus still present.  The PDA and distal RCA beyond the appear free of thrombus.  The LAD has 70% long proximal stenosis with a stenosis in the mid vessel that looks to be at least 70% left circumflex has 95% stenosis at the bifurcation of 2 marginal branches.  LVEDP was 17.  2D echocardiogram showed an EF of 35 to 40% with severe hypokinesis of the inferior and inferolateral walls.  There is no significant valvular abnormality.  He has remained free of chest discomfort since his admission at [facility]  The Brilinta was stopped and he was transferred to [facility] for consideration of coronary bypass surgery.  He has a strong family history of heart disease with his father  having coronary bypass surgery at age 46.  He smokes 1 pack of cigarettes per day and has recently been abusing cocaine.  I agree that coronary artery bypass graft surgery is the best treatment for this patient to prevent further ischemia and infarction.  His last dose of Brilinta was on 05/16/2020 so I would wait at least 5 days through surgery. I discussed the operative procedure with the patient including alternatives, benefits and risks; including but not limited to bleeding, blood transfusion, infection, stroke, myocardial infarction, graft failure, heart block requiring a permanent pacemaker, organ dysfunction, and death.  Matthew Tapia understands and agrees to proceed.  We will tentatively schedule surgery for Dr. Kipp Brood on Tuesday.

## 2020-05-18 ENCOUNTER — Inpatient Hospital Stay (HOSPITAL_COMMUNITY): Payer: Self-pay

## 2020-05-18 DIAGNOSIS — I2510 Atherosclerotic heart disease of native coronary artery without angina pectoris: Secondary | ICD-10-CM

## 2020-05-18 LAB — BASIC METABOLIC PANEL
Anion gap: 14 (ref 5–15)
BUN: 13 mg/dL (ref 6–20)
CO2: 21 mmol/L — ABNORMAL LOW (ref 22–32)
Calcium: 9.5 mg/dL (ref 8.9–10.3)
Chloride: 103 mmol/L (ref 98–111)
Creatinine, Ser: 0.81 mg/dL (ref 0.61–1.24)
GFR, Estimated: >60 mL/min (ref >60)
Glucose, Bld: 115 mg/dL — ABNORMAL HIGH (ref 70–99)
Potassium: 3.8 mmol/L (ref 3.5–5.1)
Sodium: 138 mmol/L (ref 135–145)

## 2020-05-18 MED ORDER — SPIRONOLACTONE 12.5 MG HALF TABLET
12.5000 mg | ORAL_TABLET | Freq: Every day | ORAL | Status: DC
  Administered 2020-05-18 – 2020-05-20 (×3): 12.5 mg via ORAL
  Filled 2020-05-18 (×3): qty 1

## 2020-05-18 MED ORDER — LORAZEPAM 1 MG PO TABS
1.0000 mg | ORAL_TABLET | Freq: Once | ORAL | Status: AC | PRN
  Administered 2020-05-18: 1 mg via ORAL
  Filled 2020-05-18: qty 1

## 2020-05-18 MED ORDER — FUROSEMIDE 10 MG/ML IJ SOLN
40.0000 mg | Freq: Once | INTRAMUSCULAR | Status: AC
  Administered 2020-05-18: 40 mg via INTRAVENOUS
  Filled 2020-05-18: qty 4

## 2020-05-18 NOTE — Plan of Care (Signed)

## 2020-05-18 NOTE — Plan of Care (Signed)
  Problem: Education: Goal: Knowledge of General Education information will improve Description: Including pain rating scale, medication(s)/side effects and non-pharmacologic comfort measures Outcome: Progressing   Problem: Clinical Measurements: Goal: Ability to maintain clinical measurements within normal limits will improve Outcome: Progressing Goal: Will remain free from infection Outcome: Progressing Goal: Respiratory complications will improve Outcome: Progressing Goal: Cardiovascular complication will be avoided Outcome: Progressing   Problem: Activity: Goal: Risk for activity intolerance will decrease Outcome: Progressing   Problem: Nutrition: Goal: Adequate nutrition will be maintained Outcome: Progressing   Problem: Elimination: Goal: Will not experience complications related to bowel motility Outcome: Progressing Goal: Will not experience complications related to urinary retention Outcome: Progressing   Problem: Pain Managment: Goal: General experience of comfort will improve Outcome: Progressing

## 2020-05-18 NOTE — Progress Notes (Signed)
Cardiology Progress Note  Patient ID: Matthew Tapia MRN: [id number] DOB: Nov 11, 1965 Date of Encounter: 05/18/2020  Primary Cardiologist: Lorine Bears, MD  Subjective   Chief Complaint: No chest pain.  HPI: Evaluated by CT surgery last night.  Awaiting for the attending for consultation completion.  Denies any chest pain.  Telemetry unremarkable.  ROS:  All other ROS reviewed and negative. Pertinent positives noted in the HPI.     Inpatient Medications  Scheduled Meds: . aspirin EC  81 mg Oral Daily  . atorvastatin  80 mg Oral Daily  . carvedilol  3.125 mg Oral BID WC  . furosemide  40 mg Intravenous Once  . losartan  25 mg Oral Daily  . nicotine  21 mg Transdermal Daily  . spironolactone  12.5 mg Oral Daily   Continuous Infusions: . tirofiban 0.15 mcg/kg/min (05/18/20 0437)   PRN Meds: acetaminophen, nitroGLYCERIN, ondansetron (ZOFRAN) IV   Vital Signs   Vitals:   05/17/20 1700 05/17/20 2000 05/17/20 2300 05/18/20 0300  BP: 121/72 117/86 118/70 104/68  Pulse: 82 83 88 78  Resp: 20 20 (!) 22 (!) 22  Temp:  98.5 F (36.9 C) 98.4 F (36.9 C) 98.8 F (37.1 C)  TempSrc:   Oral Oral  SpO2: 92% 96% 96% 93%  Weight:    89.1 kg    Intake/Output Summary (Last 24 hours) at 05/18/2020 1024 Last data filed at 05/18/2020 0400 Gross per 24 hour  Intake 306.41 ml  Output 1450 ml  Net -1143.59 ml   Last 3 Weights 05/18/2020 05/16/2020 05/15/2020  Weight (lbs) 196 lb 6.4 oz 197 lb 8.5 oz 197 lb 8.5 oz  Weight (kg) 89.086 kg 89.6 kg 89.6 kg      Telemetry  Overnight telemetry shows sinus rhythm with heart rate in the 80s, which I personally reviewed.   ECG  The most recent ECG shows sinus rhythm, heart rate 83, inferior infarct with T wave inversion in the inferior leads, which I personally reviewed.   Physical Exam   Vitals:   05/17/20 1700 05/17/20 2000 05/17/20 2300 05/18/20 0300  BP: 121/72 117/86 118/70 104/68  Pulse: 82 83 88 78  Resp: 20 20 (!) 22 (!)  22  Temp:  98.5 F (36.9 C) 98.4 F (36.9 C) 98.8 F (37.1 C)  TempSrc:   Oral Oral  SpO2: 92% 96% 96% 93%  Weight:    89.1 kg     Intake/Output Summary (Last 24 hours) at 05/18/2020 1024 Last data filed at 05/18/2020 0400 Gross per 24 hour  Intake 306.41 ml  Output 1450 ml  Net -1143.59 ml    Last 3 Weights 05/18/2020 05/16/2020 05/15/2020  Weight (lbs) 196 lb 6.4 oz 197 lb 8.5 oz 197 lb 8.5 oz  Weight (kg) 89.086 kg 89.6 kg 89.6 kg    Body mass index is 31.7 kg/m.   General: Well nourished, well developed, in no acute distress Head: Atraumatic, normal size  Eyes: PEERLA, EOMI  Neck: Supple, JVD 7 to 8 cm of water Endocrine: No thryomegaly Cardiac: Normal S1, S2; RRR; no murmurs, rubs, or gallops Lungs: Crackles at the lung bases Abd: Soft, nontender, no hepatomegaly  Ext: No edema, pulses 2+ Musculoskeletal: No deformities, BUE and BLE strength normal and equal Skin: Warm and dry, no rashes   Neuro: Alert and oriented to person, place, time, and situation, CNII-XII grossly intact, no focal deficits  Psych: Normal mood and affect   Labs  High Sensitivity Troponin:   Recent Labs  Lab 05/14/20 1256 05/14/20 1510 05/15/20 0322  TROPONINIHS 1,815* 1,989* 4,593*     Cardiac EnzymesNo results for input(s): TROPONINI in the last 168 hours. No results for input(s): TROPIPOC in the last 168 hours.  Chemistry Recent Labs  Lab 05/15/20 0322 05/17/20 0429 05/18/20 0046  NA 141 138 138  K 3.9 4.1 3.8  CL 109 105 103  CO2 24 24 21*  GLUCOSE 115* 103* 115*  BUN 9 12 13   CREATININE 0.73 0.58* 0.81  CALCIUM 8.5* 9.2 9.5  GFRNONAA >60 >60 >60  ANIONGAP 8 9 14     Hematology Recent Labs  Lab 05/14/20 1510 05/15/20 0322 05/17/20 0429  WBC 12.2* 13.0* 13.0*  RBC 4.15* 4.07* 4.63  HGB 13.2 12.8* 14.9  HCT 39.5 38.9* 43.5  MCV 95.2 95.6 94.0  MCH 31.8 31.4 32.2  MCHC 33.4 32.9 34.3  RDW 12.4 12.3 12.3  PLT 347 392 436*   BNP Recent Labs  Lab 05/14/20 1249   BNP 534.9*    DDimer No results for input(s): DDIMER in the last 168 hours.   Radiology  No results found.  Cardiac Studies  LHC 05/16/2020   Prox LAD to Mid LAD lesion is 70% stenosed.  Mid LAD to Dist LAD lesion is 40% stenosed.  Mid Cx to Dist Cx lesion is 95% stenosed.  2nd Mrg lesion is 80% stenosed.  Prox RCA to Dist RCA lesion is 80% stenosed.  1st Diag lesion is 99% stenosed.   1.  Severe three-vessel coronary artery disease.  Thrombus burden in the right coronary artery improved significantly in the vessel has normal TIMI-3 flow but has diffuse disease from the proximal to the distal segment before the bifurcation.  In addition, the patient seems to have significant proximal to mid LAD disease which was highly significant by fractional flow reserve evaluation with an IFR ratio of 0.76.  There is complex mid left circumflex disease at the bifurcation of OM 2.  There is also subtotal occlusion of first diagonal which seems to be diffusely diseased.  2.  Left ventricular angiography was not performed.  EF was 35 to 40% by echo. 3.  Significant improvement in left ventricular end-diastolic pressure which is currently only mildly elevated at 17 mmHg.  Recommendations: Given diffuse disease in the right coronary artery and the presence of three-vessel coronary artery disease, recommend evaluation for CABG. I am going to discontinue treatment with Brilinta. Given residual thrombus in the right coronary artery, recommend continuing Aggrastat infusion until CABG. Suspect that CABG can be done early next week. I will make arrangements for transfer to [facility].  TTE 05/15/2020 1. Left ventricular ejection fraction, by estimation, is 35 to 40%. The  left ventricle has moderately decreased function. The left ventricle  demonstrates regional wall motion abnormalities (see scoring  diagram/findings for description). Left ventricular  diastolic parameters are  consistent with Grade I diastolic dysfunction  (impaired relaxation). There is severe hypokinesis of the left  ventricular, entire inferolateral wall and inferior wall. The average left  ventricular global longitudinal strain is  -11.6 %. The global longitudinal strain is abnormal.  2. Right ventricular systolic function is normal. The right ventricular  size is normal.  3. The mitral valve is normal in structure. No evidence of mitral valve  regurgitation.  4. The aortic valve was not well visualized. Aortic valve regurgitation  is not visualized.  5. The inferior vena cava is normal in size with greater than 50%  respiratory variability, suggesting  right atrial pressure of 3 mmHg.    Patient Profile  Matthew Tapia is a 54 y.o. male with history of tobacco abuse who was admitted on 05/14/2020 for inferior STEMI to [facility].  Was found to have diffuse RCA thrombus.  He underwent thrombectomy with angioplasty.  Was unable to have full revascularization and recommended for CABG.  Assessment & Plan   1.  Inferior STEMI, three-vessel CAD -Initially admitted on 05/14/2020 with inferior STEMI.  Underwent aspiration thrombectomy with angioplasty to the RCA.  Still has residual disease in the distal segments.  Also has significant disease in the LAD and circumflex.  Was sent to [facility] for CABG. -Denies any chest pain. -Continue aspirin.  On tirofiban infusion at recommendation of interventional cardiology until the time of CABG given high thrombus burden in the RCA. -Continue high intensity statin. -He is on Coreg. -Awaiting final CT surgery recommendation.  He did receive Brilinta on 05/16/2020.  They would likely want a washout before CABG.  2.  Acute systolic heart failure, EF 35 to 40% -EF 35 to 40%.  Appears a bit volume up today.  Lasix 40 mg IV. -Also with cough.  We will obtain a chest x-ray. -On Coreg 3.125 mg twice daily.  On losartan 25 mg a day.  I have added  Aldactone 12.5 mg daily.  BP is a bit soft.  May consider transition to [facility].  3.  Tobacco abuse -Smoking cessation recommended.  For questions or updates, please contact [facility] Please consult [email or web address] for contact info under   Time Spent with Patient: I have spent a total of 35 minutes with patient reviewing hospital notes, telemetry, EKGs, labs and examining the patient as well as establishing an assessment and plan that was discussed with the patient.  > 50% of time was spent in direct patient care.    Signed, Lenna Gilford. Flora Lipps, MD [facility]  [facility]  05/18/2020 10:24 AM

## 2020-05-19 LAB — CBC
HCT: 42.2 % (ref 39.0–52.0)
Hemoglobin: 14.7 g/dL (ref 13.0–17.0)
MCH: 32.7 pg (ref 26.0–34.0)
MCHC: 34.8 g/dL (ref 30.0–36.0)
MCV: 94 fL (ref 80.0–100.0)
Platelets: 472 10*3/uL — ABNORMAL HIGH (ref 150–400)
RBC: 4.49 MIL/uL (ref 4.22–5.81)
RDW: 12.7 % (ref 11.5–15.5)
WBC: 13.7 10*3/uL — ABNORMAL HIGH (ref 4.0–10.5)
nRBC: 0 % (ref 0.0–0.2)

## 2020-05-19 LAB — BASIC METABOLIC PANEL
Anion gap: 10 (ref 5–15)
BUN: 12 mg/dL (ref 6–20)
CO2: 22 mmol/L (ref 22–32)
Calcium: 9.2 mg/dL (ref 8.9–10.3)
Chloride: 104 mmol/L (ref 98–111)
Creatinine, Ser: 0.8 mg/dL (ref 0.61–1.24)
GFR, Estimated: >60 mL/min (ref >60)
Glucose, Bld: 102 mg/dL — ABNORMAL HIGH (ref 70–99)
Potassium: 4 mmol/L (ref 3.5–5.1)
Sodium: 136 mmol/L (ref 135–145)

## 2020-05-19 LAB — HEPARIN LEVEL (UNFRACTIONATED): Heparin Unfractionated: 0.14 IU/mL — ABNORMAL LOW (ref 0.30–0.70)

## 2020-05-19 MED ORDER — HEPARIN (PORCINE) 25000 UT/250ML-% IV SOLN
1300.0000 [IU]/h | INTRAVENOUS | Status: DC
  Filled 2020-05-19: qty 250

## 2020-05-19 MED ORDER — IPRATROPIUM-ALBUTEROL 20-100 MCG/ACT IN AERS
1.0000 | INHALATION_SPRAY | Freq: Two times a day (BID) | RESPIRATORY_TRACT | Status: DC
  Administered 2020-05-19 – 2020-05-20 (×3): 1 via RESPIRATORY_TRACT
  Filled 2020-05-19: qty 4

## 2020-05-19 MED ORDER — LORAZEPAM 0.5 MG PO TABS
0.5000 mg | ORAL_TABLET | Freq: Once | ORAL | Status: AC
  Administered 2020-05-19: 0.5 mg via ORAL
  Filled 2020-05-19: qty 1

## 2020-05-19 MED ORDER — HEPARIN (PORCINE) 25000 UT/250ML-% IV SOLN
1400.0000 [IU]/h | INTRAVENOUS | Status: AC
  Administered 2020-05-19: 1300 [IU]/h via INTRAVENOUS
  Filled 2020-05-19: qty 250

## 2020-05-19 MED ORDER — ENOXAPARIN SODIUM 40 MG/0.4ML ~~LOC~~ SOLN: 40.0000 mg | SUBCUTANEOUS | Status: DC

## 2020-05-19 NOTE — Progress Notes (Signed)
Cardiology Progress Note  Patient ID: Matthew Tapia MRN: [id number] DOB: Apr 04, 1966 Date of Encounter: 05/19/2020  Primary Cardiologist: Lorine Bears, MD  Subjective   Chief Complaint: None.  HPI: Three-vessel CAD.  Awaiting CABG.  No chest pain.  Chest x-ray clear.  Wheezy and rhonchorous on examination.  ROS:  All other ROS reviewed and negative. Pertinent positives noted in the HPI.     Inpatient Medications  Scheduled Meds: . aspirin EC  81 mg Oral Daily  . atorvastatin  80 mg Oral Daily  . carvedilol  3.125 mg Oral BID WC  . Ipratropium-Albuterol  1 puff Inhalation BID  . losartan  25 mg Oral Daily  . nicotine  21 mg Transdermal Daily  . spironolactone  12.5 mg Oral Daily   Continuous Infusions: . tirofiban 0.15 mcg/kg/min (05/19/20 0610)   PRN Meds: acetaminophen, nitroGLYCERIN, ondansetron (ZOFRAN) IV   Vital Signs   Vitals:   05/18/20 1616 05/18/20 1915 05/19/20 0020 05/19/20 0300  BP: 106/70  108/64 (!) 96/52  Pulse:  90 77 88  Resp:  20 20 20   Temp: 98.6 F (37 C) 98.8 F (37.1 C) 98.6 F (37 C) 98.3 F (36.8 C)  TempSrc: Oral  Oral Oral  SpO2:  95% 95% 98%  Weight:    89.4 kg    Intake/Output Summary (Last 24 hours) at 05/19/2020 0853 Last data filed at 05/18/2020 1545 Gross per 24 hour  Intake 428.4 ml  Output 800 ml  Net -371.6 ml   Last 3 Weights 05/19/2020 05/18/2020 05/16/2020  Weight (lbs) 197 lb 1.5 oz 196 lb 6.4 oz 197 lb 8.5 oz  Weight (kg) 89.4 kg 89.086 kg 89.6 kg      Telemetry  Overnight telemetry shows sinus rhythm in the 80s, which I personally reviewed.   ECG  The most recent ECG shows normal sinus rhythm heart rate 79, inferior infarct with T wave inversions in the inferior leads, which I personally reviewed.   Physical Exam   Vitals:   05/18/20 1616 05/18/20 1915 05/19/20 0020 05/19/20 0300  BP: 106/70  108/64 (!) 96/52  Pulse:  90 77 88  Resp:  20 20 20   Temp: 98.6 F (37 C) 98.8 F (37.1 C) 98.6 F (37 C)  98.3 F (36.8 C)  TempSrc: Oral  Oral Oral  SpO2:  95% 95% 98%  Weight:    89.4 kg     Intake/Output Summary (Last 24 hours) at 05/19/2020 0853 Last data filed at 05/18/2020 1545 Gross per 24 hour  Intake 428.4 ml  Output 800 ml  Net -371.6 ml    Last 3 Weights 05/19/2020 05/18/2020 05/16/2020  Weight (lbs) 197 lb 1.5 oz 196 lb 6.4 oz 197 lb 8.5 oz  Weight (kg) 89.4 kg 89.086 kg 89.6 kg    Body mass index is 31.81 kg/m.  General: Well nourished, well developed, in no acute distress Head: Atraumatic, normal size  Eyes: PEERLA, EOMI  Neck: Supple, no JVD Endocrine: No thryomegaly Cardiac: Normal S1, S2; RRR; no murmurs, rubs, or gallops Lungs: Scattered wheezing and rhonchi noted Abd: Soft, nontender, no hepatomegaly  Ext: No edema, pulses 2+ Musculoskeletal: No deformities, BUE and BLE strength normal and equal Skin: Warm and dry, no rashes   Neuro: Alert and oriented to person, place, time, and situation, CNII-XII grossly intact, no focal deficits  Psych: Normal mood and affect   Labs  High Sensitivity Troponin:   Recent Labs  Lab 05/14/20 1256 05/14/20 1510 05/15/20 0322  TROPONINIHS 1,815* 1,989* 4,593*     Cardiac EnzymesNo results for input(s): TROPONINI in the last 168 hours. No results for input(s): TROPIPOC in the last 168 hours.  Chemistry Recent Labs  Lab 05/17/20 0429 05/18/20 0046 05/19/20 0701  NA 138 138 136  K 4.1 3.8 4.0  CL 105 103 104  CO2 24 21* 22  GLUCOSE 103* 115* 102*  BUN 12 13 12   CREATININE 0.58* 0.81 0.80  CALCIUM 9.2 9.5 9.2  GFRNONAA >60 >60 >60  ANIONGAP 9 14 10     Hematology Recent Labs  Lab 05/15/20 0322 05/17/20 0429 05/19/20 0047  WBC 13.0* 13.0* 13.7*  RBC 4.07* 4.63 4.49  HGB 12.8* 14.9 14.7  HCT 38.9* 43.5 42.2  MCV 95.6 94.0 94.0  MCH 31.4 32.2 32.7  MCHC 32.9 34.3 34.8  RDW 12.3 12.3 12.7  PLT 392 436* 472*   BNP Recent Labs  Lab 05/14/20 1249  BNP 534.9*    DDimer No results for input(s): DDIMER  in the last 168 hours.   Radiology  DG CHEST PORT 1 VIEW  Result Date: 05/18/2020 CLINICAL DATA:  Cough.  Current smoker. EXAM: PORTABLE CHEST 1 VIEW COMPARISON:  05/14/2020 FINDINGS: Lungs are adequately inflated and otherwise clear. Cardiomediastinal silhouette and remainder of the exam is unchanged. IMPRESSION: No active disease. Electronically Signed   By: 05/20/2020 M.D.   On: 05/18/2020 11:04    Cardiac Studies  LHC 05/16/2020    Prox LAD to Mid LAD lesion is 70% stenosed.  Mid LAD to Dist LAD lesion is 40% stenosed.  Mid Cx to Dist Cx lesion is 95% stenosed.  2nd Mrg lesion is 80% stenosed.  Prox RCA to Dist RCA lesion is 80% stenosed.  1st Diag lesion is 99% stenosed.   1.  Severe three-vessel coronary artery disease.  Thrombus burden in the right coronary artery improved significantly in the vessel has normal TIMI-3 flow but has diffuse disease from the proximal to the distal segment before the bifurcation.  In addition, the patient seems to have significant proximal to mid LAD disease which was highly significant by fractional flow reserve evaluation with an IFR ratio of 0.76.  There is complex mid left circumflex disease at the bifurcation of OM 2.  There is also subtotal occlusion of first diagonal which seems to be diffusely diseased.  2.  Left ventricular angiography was not performed.  EF was 35 to 40% by echo. 3.  Significant improvement in left ventricular end-diastolic pressure which is currently only mildly elevated at 17 mmHg.  Recommendations: Given diffuse disease in the right coronary artery and the presence of three-vessel coronary artery disease, recommend evaluation for CABG. I am going to discontinue treatment with Brilinta. Given residual thrombus in the right coronary artery, recommend continuing Aggrastat infusion until CABG. Suspect that CABG can be done early next week. I will make arrangements for transfer to [facility].  TTE  05/15/2020 40%. The  left ventricle has moderately decreased function. The left ventricle  demonstrates regional wall motion abnormalities (see scoring  diagram/findings for description). Left ventricular  diastolic parameters are consistent with Grade I diastolic dysfunction  (impaired relaxation). There is severe hypokinesis of the left  ventricular, entire inferolateral wall and inferior wall. The average left  ventricular global longitudinal strain is  -11.6 %. The global longitudinal strain is abnormal.  2. Right ventricular systolic function is normal. The right ventricular  size is normal.  3. The mitral valve is normal in structure.  No evidence of mitral valve  regurgitation.  4. The aortic valve was not well visualized. Aortic valve regurgitation  is not visualized.  5. The inferior vena cava is normal in size with greater than 50%  respiratory variability, suggesting right atrial pressure of 3 mmHg.    Patient Profile  Matthew Tapia is a 55 y.o. male with history of tobacco abuse who was admitted on 05/14/2020 for inferior STEMI to [facility].  Was found to have diffuse RCA thrombus.  He underwent thrombectomy with angioplasty.  Was unable to have full revascularization and recommended for CABG.  Assessment & Plan   1.  Wheezing/rhonchi/tobacco abuse -Chest x-ray clear.  A bit wheezy on exam.  We will start DuoNebs twice daily.  2.  Three-vessel CAD/inferior STEMI -Admitted to [facility] on May 14, 2020 with inferior STEMI.  Underwent aspiration thrombectomy with angioplasty to the RCA.  Had residual disease in the distal RCA.  Also had significant disease in the LAD and circumflex.  Was sent to come in for CABG. -Recommendation per interventional cardiology given the thrombus burden in the RCA was to remain on tirofiban until the time of surgery.  He will continue this. -He is on aspirin.  On high intensity statin. -He is on a beta-blocker. -He did  receive ticagrelor.  Evaluated by cardiac surgery.  Tentative plan is for surgery on Tuesday, May 21, 2020.  They would like for a washout of his ticagrelor.  He appears optimized from a volume standpoint.  The only thing surgically I see as a barrier is his wheezing.  Hopefully this will improve with treatment as above.  3.  Acute systolic heart failure, EF 35-40% -Chest x-ray clear.  Was given Lasix 40 mg IV yesterday with good response.  Suspect his cough is more related to COPD.  See above. -Continue Coreg 3.125 mg twice daily.  Losartan 25 mg daily and Aldactone 12.5 mg daily added yesterday.  Can consider Entresto.  4.  Tobacco abuse/cocaine use -Cessation advised.  FEN -no IVF -dvt ppx: Lovenox -Diet: Heart healthy -Code: Full  For questions or updates, please contact [facility] Please consult [email or web address] for contact info under   Time Spent with Patient: I have spent a total of 25 minutes with patient reviewing hospital notes, telemetry, EKGs, labs and examining the patient as well as establishing an assessment and plan that was discussed with the patient.  > 50% of time was spent in direct patient care.    Signed, Lenna Gilford. Flora Lipps, MD [facility]  [facility]  05/19/2020 8:53 AM

## 2020-05-19 NOTE — Progress Notes (Addendum)
ANTICOAGULATION CONSULT NOTE   Pharmacy Consult for heparin  Indication: chest pain/ACS    Labs: Recent Labs    05/17/20 0429 05/18/20 0046 05/19/20 0047 05/19/20 0701 05/19/20 2217  HGB 14.9  --  14.7  --   --   HCT 43.5  --  42.2  --   --   PLT 436*  --  472*  --   --   HEPARINUNFRC  --   --   --   --  0.14*  CREATININE 0.58* 0.81  --  0.80  --     Assessment: Matthew Tapia s/p STEMI/thrombectomy and POBA 12/21.  Has been on tirofiban since admission awaiting ticagrelor wash out last dose 12/22  CABG next week. CBC stable, per last cath 12/23 thrombus burden improved, discussed with cardiology > will transition tirofiban to heparin until CABG.   Initial heparin level 0.14 units/ml.  Level drawn early and is subtherapeutic  Goal of Therapy:  Heparin level 0.3-0.7 units/ml Monitor platelets by anticoagulation protocol: Yes   Plan:  Increase heparin drip 1400 uts/hr Check heparin level in am Daily HL and CBC Monitor s/s bleeding   Thanks for allowing pharmacy to be a part of this patient's care.  Talbert Cage, PharmD Clinical Pharmacist 05/19/2020 11:13 PM   Addum:  MD wished to resume tirofiban for active CP.  Heparin was stopped ~ 1 hr after initiation of tirofiban.  Now to resume heparin drip.  Will resume at 1300 units/hr with lower goal 0.3-0.5 units/ml Heparin level 6 hours after restart

## 2020-05-19 NOTE — Progress Notes (Signed)
ANTICOAGULATION CONSULT NOTE - Initial Consult  Pharmacy Consult for Tirofiban > heparin  Indication: chest pain/ACS  No Known Allergies  Patient Measurements: Weight: 89.4 kg (197 lb 1.5 oz)   Vital Signs: Temp: 98.5 F (36.9 C) (12/26 0900) Temp Source: Oral (12/26 0900) BP: 111/77 (12/26 1200) Pulse Rate: 79 (12/26 1200)  Labs: Recent Labs    05/17/20 0429 05/18/20 0046 05/19/20 0047 05/19/20 0701  HGB 14.9  --  14.7  --   HCT 43.5  --  42.2  --   PLT 436*  --  472*  --   CREATININE 0.58* 0.81  --  0.80    Estimated Creatinine Clearance: 109.2 mL/min (by C-G formula based on SCr of 0.8 mg/dL).   Medical History: Past Medical History:  Diagnosis Date  . Anxiety   . CAD, multiple vessel   . Dyslipidemia, goal LDL below 70   . Former smoker    Quit 2021  . HFrEF (heart failure with reduced ejection fraction) (HCC)   . Polysubstance abuse (HCC)    UDS positive for cocaine, amphetamines, benzos 05/15/20  . ST elevation myocardial infarction (STEMI) of inferior wall [facility])       Assessment: 54yom s/p STEMI/thrombectomy and POBA 12/21.  Has been on tirofiban since admission awaiting ticagrelor wash out last dose 12/22  CABG next week. CBC stable, per last cath 12/23 thrombus burden improved, discussed with cardiology > will transition tirofiban to heparin until CABG.    Goal of Therapy:  Heparin level 0.3-0.7 units/ml Monitor platelets by anticoagulation protocol: Yes   Plan:  Stop Tirofiban when bag empty Start heparin drip 1300 uts/hr Check heparin level 17fhr after start Daily HL and CBC Monitor s/s bleeding    Leota Sauers Pharm.D. CPP, BCPS Clinical Pharmacist [phone] 05/19/2020 3:15 PM

## 2020-05-19 NOTE — H&P (View-Only) (Signed)
Cardiology Progress Note  Patient ID: Matthew Tapia MRN: [id number] DOB: Apr 04, 1966 Date of Encounter: 05/19/2020  Primary Cardiologist: Lorine Bears, MD  Subjective   Chief Complaint: None.  HPI: Three-vessel CAD.  Awaiting CABG.  No chest pain.  Chest x-ray clear.  Wheezy and rhonchorous on examination.  ROS:  All other ROS reviewed and negative. Pertinent positives noted in the HPI.     Inpatient Medications  Scheduled Meds: . aspirin EC  81 mg Oral Daily  . atorvastatin  80 mg Oral Daily  . carvedilol  3.125 mg Oral BID WC  . Ipratropium-Albuterol  1 puff Inhalation BID  . losartan  25 mg Oral Daily  . nicotine  21 mg Transdermal Daily  . spironolactone  12.5 mg Oral Daily   Continuous Infusions: . tirofiban 0.15 mcg/kg/min (05/19/20 0610)   PRN Meds: acetaminophen, nitroGLYCERIN, ondansetron (ZOFRAN) IV   Vital Signs   Vitals:   05/18/20 1616 05/18/20 1915 05/19/20 0020 05/19/20 0300  BP: 106/70  108/64 (!) 96/52  Pulse:  90 77 88  Resp:  20 20 20   Temp: 98.6 F (37 C) 98.8 F (37.1 C) 98.6 F (37 C) 98.3 F (36.8 C)  TempSrc: Oral  Oral Oral  SpO2:  95% 95% 98%  Weight:    89.4 kg    Intake/Output Summary (Last 24 hours) at 05/19/2020 0853 Last data filed at 05/18/2020 1545 Gross per 24 hour  Intake 428.4 ml  Output 800 ml  Net -371.6 ml   Last 3 Weights 05/19/2020 05/18/2020 05/16/2020  Weight (lbs) 197 lb 1.5 oz 196 lb 6.4 oz 197 lb 8.5 oz  Weight (kg) 89.4 kg 89.086 kg 89.6 kg      Telemetry  Overnight telemetry shows sinus rhythm in the 80s, which I personally reviewed.   ECG  The most recent ECG shows normal sinus rhythm heart rate 79, inferior infarct with T wave inversions in the inferior leads, which I personally reviewed.   Physical Exam   Vitals:   05/18/20 1616 05/18/20 1915 05/19/20 0020 05/19/20 0300  BP: 106/70  108/64 (!) 96/52  Pulse:  90 77 88  Resp:  20 20 20   Temp: 98.6 F (37 C) 98.8 F (37.1 C) 98.6 F (37 C)  98.3 F (36.8 C)  TempSrc: Oral  Oral Oral  SpO2:  95% 95% 98%  Weight:    89.4 kg     Intake/Output Summary (Last 24 hours) at 05/19/2020 0853 Last data filed at 05/18/2020 1545 Gross per 24 hour  Intake 428.4 ml  Output 800 ml  Net -371.6 ml    Last 3 Weights 05/19/2020 05/18/2020 05/16/2020  Weight (lbs) 197 lb 1.5 oz 196 lb 6.4 oz 197 lb 8.5 oz  Weight (kg) 89.4 kg 89.086 kg 89.6 kg    Body mass index is 31.81 kg/m.  General: Well nourished, well developed, in no acute distress Head: Atraumatic, normal size  Eyes: PEERLA, EOMI  Neck: Supple, no JVD Endocrine: No thryomegaly Cardiac: Normal S1, S2; RRR; no murmurs, rubs, or gallops Lungs: Scattered wheezing and rhonchi noted Abd: Soft, nontender, no hepatomegaly  Ext: No edema, pulses 2+ Musculoskeletal: No deformities, BUE and BLE strength normal and equal Skin: Warm and dry, no rashes   Neuro: Alert and oriented to person, place, time, and situation, CNII-XII grossly intact, no focal deficits  Psych: Normal mood and affect   Labs  High Sensitivity Troponin:   Recent Labs  Lab 05/14/20 1256 05/14/20 1510 05/15/20 0322  TROPONINIHS 1,815* 1,989* 4,593*     Cardiac EnzymesNo results for input(s): TROPONINI in the last 168 hours. No results for input(s): TROPIPOC in the last 168 hours.  Chemistry Recent Labs  Lab 05/17/20 0429 05/18/20 0046 05/19/20 0701  NA 138 138 136  K 4.1 3.8 4.0  CL 105 103 104  CO2 24 21* 22  GLUCOSE 103* 115* 102*  BUN 12 13 12   CREATININE 0.58* 0.81 0.80  CALCIUM 9.2 9.5 9.2  GFRNONAA >60 >60 >60  ANIONGAP 9 14 10     Hematology Recent Labs  Lab 05/15/20 0322 05/17/20 0429 05/19/20 0047  WBC 13.0* 13.0* 13.7*  RBC 4.07* 4.63 4.49  HGB 12.8* 14.9 14.7  HCT 38.9* 43.5 42.2  MCV 95.6 94.0 94.0  MCH 31.4 32.2 32.7  MCHC 32.9 34.3 34.8  RDW 12.3 12.3 12.7  PLT 392 436* 472*   BNP Recent Labs  Lab 05/14/20 1249  BNP 534.9*    DDimer No results for input(s): DDIMER  in the last 168 hours.   Radiology  DG CHEST PORT 1 VIEW  Result Date: 05/18/2020 CLINICAL DATA:  Cough.  Current smoker. EXAM: PORTABLE CHEST 1 VIEW COMPARISON:  05/14/2020 FINDINGS: Lungs are adequately inflated and otherwise clear. Cardiomediastinal silhouette and remainder of the exam is unchanged. IMPRESSION: No active disease. Electronically Signed   By: 05/20/2020 M.D.   On: 05/18/2020 11:04    Cardiac Studies  LHC 05/16/2020    Prox LAD to Mid LAD lesion is 70% stenosed.  Mid LAD to Dist LAD lesion is 40% stenosed.  Mid Cx to Dist Cx lesion is 95% stenosed.  2nd Mrg lesion is 80% stenosed.  Prox RCA to Dist RCA lesion is 80% stenosed.  1st Diag lesion is 99% stenosed.   1.  Severe three-vessel coronary artery disease.  Thrombus burden in the right coronary artery improved significantly in the vessel has normal TIMI-3 flow but has diffuse disease from the proximal to the distal segment before the bifurcation.  In addition, the patient seems to have significant proximal to mid LAD disease which was highly significant by fractional flow reserve evaluation with an IFR ratio of 0.76.  There is complex mid left circumflex disease at the bifurcation of OM 2.  There is also subtotal occlusion of first diagonal which seems to be diffusely diseased.  2.  Left ventricular angiography was not performed.  EF was 35 to 40% by echo. 3.  Significant improvement in left ventricular end-diastolic pressure which is currently only mildly elevated at 17 mmHg.  Recommendations: Given diffuse disease in the right coronary artery and the presence of three-vessel coronary artery disease, recommend evaluation for CABG. I am going to discontinue treatment with Brilinta. Given residual thrombus in the right coronary artery, recommend continuing Aggrastat infusion until CABG. Suspect that CABG can be done early next week. I will make arrangements for transfer to [facility].  TTE  05/15/2020 40%. The  left ventricle has moderately decreased function. The left ventricle  demonstrates regional wall motion abnormalities (see scoring  diagram/findings for description). Left ventricular  diastolic parameters are consistent with Grade I diastolic dysfunction  (impaired relaxation). There is severe hypokinesis of the left  ventricular, entire inferolateral wall and inferior wall. The average left  ventricular global longitudinal strain is  -11.6 %. The global longitudinal strain is abnormal.  2. Right ventricular systolic function is normal. The right ventricular  size is normal.  3. The mitral valve is normal in structure.  No evidence of mitral valve  regurgitation.  4. The aortic valve was not well visualized. Aortic valve regurgitation  is not visualized.  5. The inferior vena cava is normal in size with greater than 50%  respiratory variability, suggesting right atrial pressure of 3 mmHg.    Patient Profile  Matthew Tapia is a 55 y.o. male with history of tobacco abuse who was admitted on 05/14/2020 for inferior STEMI to [facility].  Was found to have diffuse RCA thrombus.  He underwent thrombectomy with angioplasty.  Was unable to have full revascularization and recommended for CABG.  Assessment & Plan   1.  Wheezing/rhonchi/tobacco abuse -Chest x-ray clear.  A bit wheezy on exam.  We will start DuoNebs twice daily.  2.  Three-vessel CAD/inferior STEMI -Admitted to  regional on May 14, 2020 with inferior STEMI.  Underwent aspiration thrombectomy with angioplasty to the RCA.  Had residual disease in the distal RCA.  Also had significant disease in the LAD and circumflex.  Was sent to come in for CABG. -Recommendation per interventional cardiology given the thrombus burden in the RCA was to remain on tirofiban until the time of surgery.  He will continue this. -He is on aspirin.  On high intensity statin. -He is on a beta-blocker. -He did  receive ticagrelor.  Evaluated by cardiac surgery.  Tentative plan is for surgery on Tuesday, May 21, 2020.  They would like for a washout of his ticagrelor.  He appears optimized from a volume standpoint.  The only thing surgically I see as a barrier is his wheezing.  Hopefully this will improve with treatment as above.  3.  Acute systolic heart failure, EF 35-40% -Chest x-ray clear.  Was given Lasix 40 mg IV yesterday with good response.  Suspect his cough is more related to COPD.  See above. -Continue Coreg 3.125 mg twice daily.  Losartan 25 mg daily and Aldactone 12.5 mg daily added yesterday.  Can consider Entresto.  4.  Tobacco abuse/cocaine use -Cessation advised.  FEN -no IVF -dvt ppx: Lovenox -Diet: Heart healthy -Code: Full  For questions or updates, please contact [facility] Please consult [email or web address] for contact info under   Time Spent with Patient: I have spent a total of 25 minutes with patient reviewing hospital notes, telemetry, EKGs, labs and examining the patient as well as establishing an assessment and plan that was discussed with the patient.  > 50% of time was spent in direct patient care.    Signed, Lenna Gilford. Flora Lipps, MD [facility]  [facility]  05/19/2020 8:53 AM

## 2020-05-19 NOTE — Plan of Care (Signed)

## 2020-05-19 NOTE — Progress Notes (Signed)
      [address]       [place] [address]             [phone]      No further chest pain but is having a productive cough and wheezing. Respiratory treatments ordered by cardiology.  Tentatively plan for CABG by Dr. Cliffton Asters on Tuesday, 12/28 after Brilinta washout.  Gaynelle Arabian, PA-C [phone]

## 2020-05-20 ENCOUNTER — Inpatient Hospital Stay (HOSPITAL_COMMUNITY)
Admission: AD | Disposition: A | Discharge status: Home / Self Care | Payer: Self-pay | Source: Other Acute Inpatient Hospital | Attending: Thoracic Surgery (Cardiothoracic Vascular Surgery)

## 2020-05-20 ENCOUNTER — Encounter (HOSPITAL_COMMUNITY): Payer: Self-pay | Admitting: Cardiovascular Disease

## 2020-05-20 ENCOUNTER — Inpatient Hospital Stay (HOSPITAL_COMMUNITY): Payer: Self-pay

## 2020-05-20 DIAGNOSIS — I2111 ST elevation (STEMI) myocardial infarction involving right coronary artery: Secondary | ICD-10-CM

## 2020-05-20 DIAGNOSIS — I2119 ST elevation (STEMI) myocardial infarction involving other coronary artery of inferior wall: Principal | ICD-10-CM

## 2020-05-20 DIAGNOSIS — R079 Chest pain, unspecified: Secondary | ICD-10-CM

## 2020-05-20 DIAGNOSIS — I255 Ischemic cardiomyopathy: Secondary | ICD-10-CM

## 2020-05-20 DIAGNOSIS — E785 Hyperlipidemia, unspecified: Secondary | ICD-10-CM

## 2020-05-20 HISTORY — PX: CORONARY THROMBECTOMY: CATH118304

## 2020-05-20 HISTORY — PX: CORONARY/GRAFT ACUTE MI REVASCULARIZATION: CATH118305

## 2020-05-20 HISTORY — PX: CORONARY ANGIOGRAPHY: CATH118303

## 2020-05-20 LAB — APTT: aPTT: 27 seconds (ref 24–36)

## 2020-05-20 LAB — CBC
HCT: 44.2 % (ref 39.0–52.0)
Hemoglobin: 14.4 g/dL (ref 13.0–17.0)
MCH: 31 pg (ref 26.0–34.0)
MCHC: 32.6 g/dL (ref 30.0–36.0)
MCV: 95.3 fL (ref 80.0–100.0)
Platelets: 427 10*3/uL — ABNORMAL HIGH (ref 150–400)
RBC: 4.64 MIL/uL (ref 4.22–5.81)
RDW: 12.6 % (ref 11.5–15.5)
WBC: 12.5 10*3/uL — ABNORMAL HIGH (ref 4.0–10.5)
nRBC: 0 % (ref 0.0–0.2)

## 2020-05-20 LAB — BASIC METABOLIC PANEL
Anion gap: 12 (ref 5–15)
BUN: 14 mg/dL (ref 6–20)
CO2: 23 mmol/L (ref 22–32)
Calcium: 9.3 mg/dL (ref 8.9–10.3)
Chloride: 104 mmol/L (ref 98–111)
Creatinine, Ser: 0.88 mg/dL (ref 0.61–1.24)
GFR, Estimated: >60 mL/min (ref >60)
Glucose, Bld: 128 mg/dL — ABNORMAL HIGH (ref 70–99)
Potassium: 3.7 mmol/L (ref 3.5–5.1)
Sodium: 139 mmol/L (ref 135–145)

## 2020-05-20 LAB — PROTIME-INR
INR: 1 (ref 0.8–1.2)
Prothrombin Time: 12.8 seconds (ref 11.4–15.2)

## 2020-05-20 LAB — POCT ACTIVATED CLOTTING TIME: Activated Clotting Time: 309 seconds

## 2020-05-20 LAB — HEMOGLOBIN A1C
Hgb A1c MFr Bld: 5.8 % — ABNORMAL HIGH (ref 4.8–5.6)
Mean Plasma Glucose: 119.76 mg/dL

## 2020-05-20 LAB — ABO/RH: ABO/RH(D): A POS

## 2020-05-20 LAB — PREPARE RBC (CROSSMATCH)

## 2020-05-20 SURGERY — CORONARY/GRAFT ACUTE MI REVASCULARIZATION
Anesthesia: LOCAL

## 2020-05-20 MED ORDER — MIDAZOLAM HCL 2 MG/2ML IJ SOLN
INTRAMUSCULAR | Status: DC | PRN
  Administered 2020-05-20: 1 mg via INTRAVENOUS

## 2020-05-20 MED ORDER — HEPARIN SODIUM (PORCINE) 1000 UNIT/ML IJ SOLN
INTRAMUSCULAR | Status: AC
  Filled 2020-05-20: qty 1

## 2020-05-20 MED ORDER — NITROGLYCERIN IN D5W 200-5 MCG/ML-% IV SOLN
2.0000 ug/min | INTRAVENOUS | Status: DC
  Filled 2020-05-20: qty 250

## 2020-05-20 MED ORDER — IOHEXOL 350 MG/ML SOLN
INTRAVENOUS | Status: DC | PRN
  Administered 2020-05-20: 70 mL

## 2020-05-20 MED ORDER — TRANEXAMIC ACID (OHS) PUMP PRIME SOLUTION
2.0000 mg/kg | INTRAVENOUS | Status: DC
  Filled 2020-05-20: qty 1.8

## 2020-05-20 MED ORDER — ONDANSETRON HCL 4 MG/2ML IJ SOLN
INTRAMUSCULAR | Status: DC | PRN
  Administered 2020-05-20: 4 mg via INTRAVENOUS

## 2020-05-20 MED ORDER — SODIUM CHLORIDE 0.9 % IV SOLN: INTRAVENOUS | Status: AC

## 2020-05-20 MED ORDER — FAMOTIDINE IN NACL 20-0.9 MG/50ML-% IV SOLN
INTRAVENOUS | Status: AC
  Filled 2020-05-20: qty 50

## 2020-05-20 MED ORDER — SODIUM CHLORIDE 0.9% FLUSH
3.0000 mL | Freq: Two times a day (BID) | INTRAVENOUS | Status: DC
  Administered 2020-05-20: 3 mL via INTRAVENOUS

## 2020-05-20 MED ORDER — HEPARIN (PORCINE) IN NACL 1000-0.9 UT/500ML-% IV SOLN
INTRAVENOUS | Status: DC | PRN
  Administered 2020-05-20: 500 mL

## 2020-05-20 MED ORDER — SODIUM CHLORIDE 0.9 % IV SOLN
750.0000 mg | INTRAVENOUS | Status: AC
  Administered 2020-05-21: 750 mg via INTRAVENOUS
  Filled 2020-05-20: qty 750

## 2020-05-20 MED ORDER — ONDANSETRON HCL 4 MG/2ML IJ SOLN
INTRAMUSCULAR | Status: AC
  Filled 2020-05-20: qty 2

## 2020-05-20 MED ORDER — POTASSIUM CHLORIDE 2 MEQ/ML IV SOLN
80.0000 meq | INTRAVENOUS | Status: DC
  Filled 2020-05-20: qty 40

## 2020-05-20 MED ORDER — SODIUM CHLORIDE 0.9 % IV SOLN
1.5000 g | INTRAVENOUS | Status: AC
  Administered 2020-05-21: 1.5 g via INTRAVENOUS
  Filled 2020-05-20: qty 1.5

## 2020-05-20 MED ORDER — HEPARIN SODIUM (PORCINE) 1000 UNIT/ML IJ SOLN
INTRAMUSCULAR | Status: DC | PRN
  Administered 2020-05-20: 10000 [IU] via INTRAVENOUS

## 2020-05-20 MED ORDER — CHLORHEXIDINE GLUCONATE CLOTH 2 % EX PADS
6.0000 | MEDICATED_PAD | Freq: Once | CUTANEOUS | Status: AC
  Administered 2020-05-20: 6 via TOPICAL

## 2020-05-20 MED ORDER — MANNITOL 20 % IV SOLN
Freq: Once | INTRAVENOUS | Status: DC
  Filled 2020-05-20: qty 13

## 2020-05-20 MED ORDER — SODIUM CHLORIDE 0.9 % IV SOLN
INTRAVENOUS | Status: DC
  Filled 2020-05-20: qty 30

## 2020-05-20 MED ORDER — HYDRALAZINE HCL 20 MG/ML IJ SOLN: 10.0000 mg | INTRAMUSCULAR | Status: AC | PRN

## 2020-05-20 MED ORDER — LABETALOL HCL 5 MG/ML IV SOLN: 10.0000 mg | INTRAVENOUS | Status: AC | PRN

## 2020-05-20 MED ORDER — CHLORHEXIDINE GLUCONATE 0.12 % MT SOLN
15.0000 mL | Freq: Once | OROMUCOSAL | Status: AC
  Administered 2020-05-21: 15 mL via OROMUCOSAL
  Filled 2020-05-20: qty 15

## 2020-05-20 MED ORDER — PANTOPRAZOLE SODIUM 40 MG IV SOLR
40.0000 mg | Freq: Once | INTRAVENOUS | Status: AC
  Administered 2020-05-20: 40 mg via INTRAVENOUS
  Filled 2020-05-20: qty 40

## 2020-05-20 MED ORDER — MILRINONE LACTATE IN DEXTROSE 20-5 MG/100ML-% IV SOLN
0.3000 ug/kg/min | INTRAVENOUS | Status: DC
  Filled 2020-05-20: qty 100

## 2020-05-20 MED ORDER — BISACODYL 5 MG PO TBEC
5.0000 mg | DELAYED_RELEASE_TABLET | Freq: Once | ORAL | Status: AC
  Administered 2020-05-20: 5 mg via ORAL
  Filled 2020-05-20: qty 1

## 2020-05-20 MED ORDER — LIDOCAINE HCL (PF) 1 % IJ SOLN
INTRAMUSCULAR | Status: DC | PRN
  Administered 2020-05-20: 2 mL

## 2020-05-20 MED ORDER — EPINEPHRINE HCL 5 MG/250ML IV SOLN IN NS
0.0000 ug/min | INTRAVENOUS | Status: AC
  Administered 2020-05-21: 5 ug/min via INTRAVENOUS
  Filled 2020-05-20: qty 250

## 2020-05-20 MED ORDER — PLASMA-LYTE 148 IV SOLN
INTRAVENOUS | Status: DC
  Filled 2020-05-20: qty 2.5

## 2020-05-20 MED ORDER — SODIUM CHLORIDE 0.9 % IV SOLN
INTRAVENOUS | Status: AC | PRN
  Administered 2020-05-20: 10 mL/h via INTRAVENOUS

## 2020-05-20 MED ORDER — LIDOCAINE HCL (PF) 1 % IJ SOLN
INTRAMUSCULAR | Status: AC
  Filled 2020-05-20: qty 30

## 2020-05-20 MED ORDER — HEPARIN (PORCINE) 25000 UT/250ML-% IV SOLN
1300.0000 [IU]/h | INTRAVENOUS | Status: DC
  Filled 2020-05-20: qty 250

## 2020-05-20 MED ORDER — TIROFIBAN HCL IN NACL 5-0.9 MG/100ML-% IV SOLN
0.1500 ug/kg/min | INTRAVENOUS | Status: DC
  Administered 2020-05-20 – 2020-05-21 (×4): 0.15 ug/kg/min via INTRAVENOUS
  Filled 2020-05-20 (×8): qty 100

## 2020-05-20 MED ORDER — DEXMEDETOMIDINE HCL IN NACL 400 MCG/100ML IV SOLN
0.1000 ug/kg/h | INTRAVENOUS | Status: AC
  Administered 2020-05-21: .5 ug/kg/h via INTRAVENOUS
  Filled 2020-05-20: qty 100

## 2020-05-20 MED ORDER — TRANEXAMIC ACID (OHS) BOLUS VIA INFUSION
15.0000 mg/kg | INTRAVENOUS | Status: AC
  Administered 2020-05-21: 1351.5 mg via INTRAVENOUS
  Filled 2020-05-20: qty 1352

## 2020-05-20 MED ORDER — HEPARIN (PORCINE) IN NACL 1000-0.9 UT/500ML-% IV SOLN
INTRAVENOUS | Status: AC
  Filled 2020-05-20: qty 1000

## 2020-05-20 MED ORDER — METOPROLOL TARTRATE 12.5 MG HALF TABLET
12.5000 mg | ORAL_TABLET | Freq: Once | ORAL | Status: AC
  Administered 2020-05-21: 12.5 mg via ORAL
  Filled 2020-05-20: qty 1

## 2020-05-20 MED ORDER — VANCOMYCIN HCL 1500 MG/300ML IV SOLN
1500.0000 mg | INTRAVENOUS | Status: AC
  Administered 2020-05-21: 1500 mg via INTRAVENOUS
  Filled 2020-05-20: qty 300

## 2020-05-20 MED ORDER — NITROGLYCERIN IN D5W 200-5 MCG/ML-% IV SOLN
2.0000 ug/min | INTRAVENOUS | Status: DC
  Administered 2020-05-20: 5 ug/min via INTRAVENOUS
  Filled 2020-05-20: qty 250

## 2020-05-20 MED ORDER — MIDAZOLAM HCL 2 MG/2ML IJ SOLN
INTRAMUSCULAR | Status: AC
  Filled 2020-05-20: qty 2

## 2020-05-20 MED ORDER — NOREPINEPHRINE 4 MG/250ML-% IV SOLN
0.0000 ug/min | INTRAVENOUS | Status: AC
  Administered 2020-05-21: 2 ug/min via INTRAVENOUS
  Filled 2020-05-20: qty 250

## 2020-05-20 MED ORDER — VERAPAMIL HCL 2.5 MG/ML IV SOLN
INTRAVENOUS | Status: DC | PRN
  Administered 2020-05-20: 10 mL via INTRA_ARTERIAL

## 2020-05-20 MED ORDER — INSULIN REGULAR(HUMAN) IN NACL 100-0.9 UT/100ML-% IV SOLN
INTRAVENOUS | Status: AC
  Administered 2020-05-21: .8 [IU]/h via INTRAVENOUS
  Filled 2020-05-20: qty 100

## 2020-05-20 MED ORDER — PHENYLEPHRINE HCL-NACL 20-0.9 MG/250ML-% IV SOLN
30.0000 ug/min | INTRAVENOUS | Status: AC
  Administered 2020-05-21: 60 ug/min via INTRAVENOUS
  Filled 2020-05-20: qty 250

## 2020-05-20 MED ORDER — TRANEXAMIC ACID 1000 MG/10ML IV SOLN
1.5000 mg/kg/h | INTRAVENOUS | Status: AC
  Administered 2020-05-21: 1.5 mg/kg/h via INTRAVENOUS
  Filled 2020-05-20: qty 25

## 2020-05-20 MED ORDER — VERAPAMIL HCL 2.5 MG/ML IV SOLN
INTRAVENOUS | Status: AC
  Filled 2020-05-20: qty 2

## 2020-05-20 MED ORDER — SODIUM CHLORIDE 0.9% FLUSH: 3.0000 mL | INTRAVENOUS | Status: DC | PRN

## 2020-05-20 MED ORDER — FENTANYL CITRATE (PF) 100 MCG/2ML IJ SOLN
INTRAMUSCULAR | Status: AC
  Filled 2020-05-20: qty 2

## 2020-05-20 MED ORDER — FAMOTIDINE IN NACL 20-0.9 MG/50ML-% IV SOLN
INTRAVENOUS | Status: AC | PRN
  Administered 2020-05-20: 20 mg via INTRAVENOUS

## 2020-05-20 MED ORDER — POTASSIUM CHLORIDE CRYS ER 20 MEQ PO TBCR
20.0000 meq | EXTENDED_RELEASE_TABLET | Freq: Once | ORAL | Status: AC
  Administered 2020-05-20: 20 meq via ORAL
  Filled 2020-05-20: qty 1

## 2020-05-20 MED ORDER — SODIUM CHLORIDE 0.9 % IV SOLN: 250.0000 mL | INTRAVENOUS | Status: DC | PRN

## 2020-05-20 MED ORDER — TEMAZEPAM 15 MG PO CAPS
15.0000 mg | ORAL_CAPSULE | Freq: Once | ORAL | Status: AC | PRN
  Administered 2020-05-20: 15 mg via ORAL
  Filled 2020-05-20: qty 1

## 2020-05-20 MED ORDER — NITROGLYCERIN 1 MG/10 ML FOR IR/CATH LAB
INTRA_ARTERIAL | Status: AC
  Filled 2020-05-20: qty 10

## 2020-05-20 MED ORDER — FENTANYL CITRATE (PF) 100 MCG/2ML IJ SOLN
INTRAMUSCULAR | Status: DC | PRN
  Administered 2020-05-20: 25 ug via INTRAVENOUS

## 2020-05-20 SURGICAL SUPPLY — 16 items
BALLN EMERGE MR 2.5X15 (BALLOONS) ×2
BALLOON EMERGE MR 2.5X15 (BALLOONS) ×1 IMPLANT
CATH 5FR JL3.5 JR4 ANG PIG MP (CATHETERS) ×2 IMPLANT
CATH EXTRAC PRONTO 5.5F 138CM (CATHETERS) ×2 IMPLANT
CATH VISTA GUIDE 6FR JR4 (CATHETERS) ×2 IMPLANT
DEVICE RAD COMP TR BAND LRG (VASCULAR PRODUCTS) ×2 IMPLANT
ELECT DEFIB PAD ADLT CADENCE (PAD) ×2 IMPLANT
GLIDESHEATH SLEND SS 6F .021 (SHEATH) ×2 IMPLANT
GUIDEWIRE INQWIRE 1.5J.035X260 (WIRE) ×1 IMPLANT
INQWIRE 1.5J .035X260CM (WIRE) ×2
KIT ENCORE 26 ADVANTAGE (KITS) ×2 IMPLANT
KIT HEART LEFT (KITS) ×2 IMPLANT
PACK CARDIAC CATHETERIZATION (CUSTOM PROCEDURE TRAY) ×2 IMPLANT
TRANSDUCER W/STOPCOCK (MISCELLANEOUS) ×2 IMPLANT
TUBING CIL FLEX 10 FLL-RA (TUBING) ×2 IMPLANT
WIRE COUGAR XT STRL 190CM (WIRE) ×2 IMPLANT

## 2020-05-20 NOTE — Plan of Care (Signed)

## 2020-05-20 NOTE — Progress Notes (Signed)
[id number] Gave pt [facility] booklet, care guide, staying in the tube handout and wrote down how to view pre op video. Gave IS and pt able to demonstrate 1500 ml with cues. Discussed importance of walking and IS after surgery. Reviewed sternal precautions. Pt stated he lives with mother who may be able to assist with care after discharge. Will follow up after surgery. Luetta Nutting RN BSN 05/20/2020 1:24 PM

## 2020-05-20 NOTE — Anesthesia Preprocedure Evaluation (Addendum)
Anesthesia Evaluation  Patient identified by MRN, date of birth, ID band Patient awake    Reviewed: Allergy & Precautions, H&P , NPO status , Patient's Chart, lab work & pertinent test results  Airway Mallampati: II  TM Distance: >3 FB Neck ROM: Full    Dental no notable dental hx. (+) Edentulous Upper, Edentulous Lower, Dental Advisory Given   Pulmonary Current Smoker and Patient abstained from smoking.,    Pulmonary exam normal breath sounds clear to auscultation       Cardiovascular Exercise Tolerance: Good hypertension, + CAD and + Past MI   Rhythm:Regular Rate:Normal     Neuro/Psych Anxiety negative neurological ROS     GI/Hepatic negative GI ROS, (+)     substance abuse  ,   Endo/Other  negative endocrine ROS  Renal/GU negative Renal ROS  negative genitourinary   Musculoskeletal   Abdominal   Peds  Hematology negative hematology ROS (+)   Anesthesia Other Findings   Reproductive/Obstetrics negative OB ROS                            Anesthesia Physical Anesthesia Plan  ASA: IV  Anesthesia Plan: General   Post-op Pain Management:    Induction: Intravenous  PONV Risk Score and Plan: 1 and Midazolam and Ondansetron  Airway Management Planned: Oral ETT  Additional Equipment: Arterial line, CVP, TEE and Ultrasound Guidance Line Placement  Intra-op Plan:   Post-operative Plan: Post-operative intubation/ventilation  Informed Consent: I have reviewed the patients History and Physical, chart, labs and discussed the procedure including the risks, benefits and alternatives for the proposed anesthesia with the patient or authorized representative who has indicated his/her understanding and acceptance.     Dental advisory given  Plan Discussed with: CRNA  Anesthesia Plan Comments:        Anesthesia Quick Evaluation

## 2020-05-20 NOTE — Plan of Care (Signed)
Called re new chest pain + inferior ST elevations. I came to see Matthew Tapia, and he was resting comfortably in bed. He was hemodynamically stable at the time. He reported to me that he had been chest pain free since receiving nitro on admission. He suddenly developed substernal chest pain this evening that now was radiating to his R jaw. It was only partially responsible to nitro tabs x 2. He has new clear ST elevations inferiorly. His aggrastat was stopped earlier this evening, and given the prior RCA thrombus that was seen w/ new STEs + chest pain, I am concerned re developing thrombus. Discussed case with Dr. Eldridge Dace. Given Matthew Tapia is scheduled to go to CABG tomorrow morning, will try to manage medically for now. Plan to start a nitro ggt (no clinical symptoms or signs of RV failure) + restart aggrastat. We will repeat an EKG in an hour or so once this is back on. If ST elevation persists and/or we cannot make Matthew Tapia CP free, may need to return to the lab early in the AM.

## 2020-05-20 NOTE — Interval H&P Note (Signed)
History and Physical Interval Note:  05/20/2020 7:35 AM  Matthew Tapia  has presented today for surgery, with the diagnosis of stemi.  The various methods of treatment have been discussed with the patient and family. After consideration of risks, benefits and other options for treatment, the patient has consented to  Procedure(s): LEFT HEART CATH AND CORONARY ANGIOGRAPHY (N/A) as a surgical intervention.  The patient's history has been reviewed, patient examined, no change in status, stable for surgery.  I have reviewed the patient's chart and labs.  Questions were answered to the patient's satisfaction.    Cath Lab Visit (complete for each Cath Lab visit)  Clinical Evaluation Leading to the Procedure:   ACS: Yes.    Non-ACS:    Anginal Classification: CCS IV  Anti-ischemic medical therapy: Minimal Therapy (1 class of medications)  Non-Invasive Test Results: No non-invasive testing performed  Prior CABG: No previous CABG        Verne Carrow

## 2020-05-20 NOTE — Progress Notes (Signed)
Signed out from overnight cards fellow that patient started having chest pain at approximately 0250, following discontinuation of aggrastat at 2300 for transition to heparin gtt. Repeat EKG after aggrastat and nitro gtt started continues to show ST elevation in inferior leads. Pt chest pain now rated as a 1-2/10. I have notified the on-call STEMI physician who has advised evaluation by rounding interventionalist. Dr. Tresa Endo notified and advised contacting CT surgery - APP advised direct communication with Dr. Vickey Sages, on call CT surgeon, awaiting call back.

## 2020-05-20 NOTE — Progress Notes (Signed)
Progress Note  Patient Name: Matthew Tapia Date of Encounter: 05/20/2020  Primary Cardiologist:   Subjective   Back from cath lab; s/p PTCA of occluded proximal LAD this am. Chest pain free.  Inpatient Medications    Scheduled Meds: . aspirin EC  81 mg Oral Daily  . atorvastatin  80 mg Oral Daily  . carvedilol  3.125 mg Oral BID WC  . [START ON 05/21/2020] chlorhexidine  15 mL Mouth/Throat Once  . Chlorhexidine Gluconate Cloth  6 each Topical Once  . [START ON 05/21/2020] epinephrine  0-10 mcg/min Intravenous To OR  . [START ON 05/21/2020] heparin-papaverine-plasmalyte irrigation   Irrigation To OR  . [START ON 05/21/2020] insulin   Intravenous To OR  . Ipratropium-Albuterol  1 puff Inhalation BID  . [START ON 05/21/2020] [place] Blood Cardioplegia vial (lidocaine/magnesium/mannitol 0.26g-4g-6.4g)   Intracoronary Once  . losartan  25 mg Oral Daily  . [START ON 05/21/2020] metoprolol tartrate  12.5 mg Oral Once  . nicotine  21 mg Transdermal Daily  . [START ON 05/21/2020] phenylephrine  30-200 mcg/min Intravenous To OR  . [START ON 05/21/2020] potassium chloride  80 mEq Other To OR  . sodium chloride flush  3 mL Intravenous Q12H  . spironolactone  12.5 mg Oral Daily  . [START ON 05/21/2020] tranexamic acid  15 mg/kg Intravenous To OR  . [START ON 05/21/2020] tranexamic acid  2 mg/kg Intracatheter To OR   Continuous Infusions: . sodium chloride 75 mL/hr at 05/20/20 1328  . sodium chloride    . [START ON 05/21/2020] cefUROXime (ZINACEF)  IV    . [START ON 05/21/2020] cefUROXime (ZINACEF)  IV    . [START ON 05/21/2020] dexmedetomidine    . [START ON 05/21/2020] heparin 30,000 units/NS 1000 mL solution for CELLSAVER    . [START ON 05/21/2020] milrinone    . nitroGLYCERIN Stopped (05/20/20 0755)  . [START ON 05/21/2020] nitroGLYCERIN    . [START ON 05/21/2020] norepinephrine    . tirofiban 0.15 mcg/kg/min (05/20/20 1547)  . [START ON 05/21/2020] tranexamic acid  (CYKLOKAPRON) infusion ([facility])    . [START ON 05/21/2020] vancomycin     PRN Meds: sodium chloride, acetaminophen, hydrALAZINE, labetalol, nitroGLYCERIN, ondansetron (ZOFRAN) IV, sodium chloride flush, temazepam   Vital Signs    Vitals:   05/20/20 1100 05/20/20 1200 05/20/20 1314 05/20/20 1400  BP: 98/63 113/76 110/72 (!) 105/59  Pulse: 73 81 88 83  Resp: 15 16 15 16   Temp:   98.3 F (36.8 C)   TempSrc:   Oral   SpO2: 97% 97% 96% 96%  Weight:        Intake/Output Summary (Last 24 hours) at 05/20/2020 1552 Last data filed at 05/20/2020 1300 Gross per 24 hour  Intake 746.2 ml  Output 2400 ml  Net -1653.8 ml    I/O since admission: -4292  Filed Weights   05/18/20 0300 05/19/20 0300 05/20/20 0613  Weight: 89.1 kg 89.4 kg 90.1 kg    Telemetry    SInus - Personally Reviewed  ECG    ECG (independently read by me): NSR at 74, prominent Q inferiorly, lass V5-6 with recurrent 1-2 mm STE inferiorly  Physical Exam    BP (!) 105/59 (BP Location: Left Arm)   Pulse 83   Temp 98.3 F (36.8 C) (Oral)   Resp 16   Wt 90.1 kg   SpO2 96%   BMI 32.06 kg/m  General: Alert, oriented, no distress.  Skin: normal turgor, no rashes, warm and dry HEENT:  Normocephalic, atraumatic. Pupils equal round and reactive to light; sclera anicteric; extraocular muscles intact;  Nose without nasal septal hypertrophy Mouth/Parynx benign; Neck: No JVD, no carotid bruits; normal carotid upstroke Lungs: clear to ausculatation and percussion; no wheezing or rales Chest wall: without tenderness to palpitation Heart: PMI not displaced, RRR, s1 s2 normal, 1/6 systolic murmur, no diastolic murmur, no rubs, gallops, thrills, or heaves Abdomen: soft, nontender; no hepatosplenomehaly, BS+; abdominal aorta nontender and not dilated by palpation. Back: no CVA tenderness Pulses 2+ R radial site stable Musculoskeletal: full range of motion, normal strength, no joint deformities Extremities: no clubbing  cyanosis or edema, Homan's sign negative  Neurologic: grossly nonfocal; Cranial nerves grossly wnl Psychologic: Normal mood and affect   Labs    Chemistry Recent Labs  Lab 05/18/20 0046 05/19/20 0701 05/20/20 0017  NA 138 136 139  K 3.8 4.0 3.7  CL 103 104 104  CO2 21* 22 23  GLUCOSE 115* 102* 128*  BUN 13 12 14   CREATININE 0.81 0.80 0.88  CALCIUM 9.5 9.2 9.3  GFRNONAA >60 >60 >60  ANIONGAP 14 10 12      Hematology Recent Labs  Lab 05/17/20 0429 05/19/20 0047 05/20/20 0017  WBC 13.0* 13.7* 12.5*  RBC 4.63 4.49 4.64  HGB 14.9 14.7 14.4  HCT 43.5 42.2 44.2  MCV 94.0 94.0 95.3  MCH 32.2 32.7 31.0  MCHC 34.3 34.8 32.6  RDW 12.3 12.7 12.6  PLT 436* 472* 427*    HS TROP: 4593  Cardiac EnzymesNo results for input(s): TROPONINI in the last 168 hours. No results for input(s): TROPIPOC in the last 168 hours.   BNP Recent Labs  Lab 05/14/20 1249  BNP 534.9*     DDimer No results for input(s): DDIMER in the last 168 hours.   Lipid Panel     Component Value Date/Time   CHOL 121 05/15/2020 0322   TRIG 161 (H) 05/15/2020 0322   HDL 25 (L) 05/15/2020 0322   CHOLHDL 4.8 05/15/2020 0322   VLDL 32 05/15/2020 0322   LDLCALC 64 05/15/2020 0322     Radiology    CARDIAC CATHETERIZATION  Result Date: 05/20/2020  Mid LAD to Dist LAD lesion is 40% stenosed.  1st Diag lesion is 99% stenosed.  Prox LAD to Mid LAD lesion is 70% stenosed.  Mid Cx to Dist Cx lesion is 95% stenosed.  2nd Mrg lesion is 80% stenosed.  Prox RCA to Dist RCA lesion is 80% stenosed.  Prox RCA lesion is 100% stenosed.  Balloon angioplasty was performed using a BALLOON EMERGE MR 2.5X15.  Post intervention, there is a 80% residual stenosis.  1. Acute occlusion of the proximal RCA 2. Successful PTCA/ balloon angioplasty of the proximal RCA 3. No change in severe disease in the LAD and circumflex Recommendations: Will continue ASA, statin and beta blocker. While awaiting bypass, will transition  from Aggrastat to Cangrelor drip due to hospital shortage of Aggrastat. CT surgery notified of change in clinical situation. I think he is stable to wait for bypass tomorrow. Will keep NPO for now.    Cardiac Studies     Intervention      Patient Profile     54 y.o. male who suffered an inferior STEMI on 05/14/20 with treatment of the occluded RCA at that time with balloon angioplasty and aspiration thrombectomy. Re-look cath on 05/16/20 with flow down all vessels but severe triple vessel CAD noted. Pt transferred to [facility] for CABG which was delayed for Brilinta washout. He  was on aggrastat which was dc'd yesterday with  Onset of severe chest pain around 3 am today EKG with inferior ST elevation and was taken back to lab this am and iunderwent repeat PTCA of occluded RCA.   Assessment & Plan    1.  Severe multivessel CAD, status post inferior STEMI May 14, 2020 treated with PTCA and aspiration thrombectomy.  Patient has been on Aggrastat which was discontinued yesterday.  Unfortunately he developed reocclusion this morning necessitating repeat catheterization and PTCA of his RCA.  The patient will undergo CABG revascularization tomorrow with Dr. Cliffton Asters.  Currently he is chest pain free on IV nitroglycerin, IV Aggrastat, and IV heparin.  2.  Ischemic cardiomyopathy with EF at 35 to 40%.  Now on carvedilol and losartan as well as spironolactone.  Depending upon potential improvement in LV function, Entresto can be considered in the future  3.  Hyperlipidemia:  Target LDL less than 70, patient currently on atorvastatin 80 mg daily.  4.  Tobacco abuse:  Smoking cessation is essential  Signed, Lennette Bihari, MD, [facility] 05/20/2020, 3:52 PM

## 2020-05-20 NOTE — Progress Notes (Addendum)
      [address]       [place] [address]             [phone]      Events noted.  Had acute reocclusion of the RCA with chest pain and EKG changes in the early morning hours. Taken to the cath lab and had successful balloon angioplasty of the proximal RCA. There is residual 80% stenosis of the RCA and MVCAD.   Resting in bed with Aggrastat  And NTG infusing. Denies chest pain.   Scheduled for CABG tomorrow morning by Dr. Cliffton Asters.   Gaynelle Arabian, PA-C [phone]  Agree with above.  Patient underwent angioplasty of his RCA, and is currently chest pain-free. He is scheduled to undergo CABG tomorrow.  Jannel Lynne Keane Scrape

## 2020-05-21 ENCOUNTER — Encounter (HOSPITAL_COMMUNITY): Payer: Self-pay | Admitting: Cardiology

## 2020-05-21 ENCOUNTER — Inpatient Hospital Stay (HOSPITAL_COMMUNITY): Payer: Self-pay | Admitting: Certified Registered Nurse Anesthetist

## 2020-05-21 ENCOUNTER — Inpatient Hospital Stay (HOSPITAL_COMMUNITY): Payer: Self-pay

## 2020-05-21 ENCOUNTER — Other Ambulatory Visit: Payer: Self-pay

## 2020-05-21 ENCOUNTER — Inpatient Hospital Stay (HOSPITAL_COMMUNITY)
Admission: AD | Disposition: A | Discharge status: Home / Self Care | Payer: Self-pay | Source: Other Acute Inpatient Hospital | Attending: Thoracic Surgery (Cardiothoracic Vascular Surgery)

## 2020-05-21 DIAGNOSIS — Z951 Presence of aortocoronary bypass graft: Secondary | ICD-10-CM

## 2020-05-21 HISTORY — PX: ENDOVEIN HARVEST OF GREATER SAPHENOUS VEIN: SHX5059

## 2020-05-21 HISTORY — PX: CORONARY ARTERY BYPASS GRAFT: SHX141

## 2020-05-21 HISTORY — PX: TEE WITHOUT CARDIOVERSION: SHX5443

## 2020-05-21 LAB — POCT I-STAT, CHEM 8
BUN: 13 mg/dL (ref 6–20)
BUN: 13 mg/dL (ref 6–20)
BUN: 12 mg/dL (ref 6–20)
BUN: 12 mg/dL (ref 6–20)
BUN: 12 mg/dL (ref 6–20)
Calcium, Ion: 1.3 mmol/L (ref 1.15–1.40)
Calcium, Ion: 1.29 mmol/L (ref 1.15–1.40)
Calcium, Ion: 1.12 mmol/L — ABNORMAL LOW (ref 1.15–1.40)
Calcium, Ion: 1.13 mmol/L — ABNORMAL LOW (ref 1.15–1.40)
Calcium, Ion: 1.29 mmol/L (ref 1.15–1.40)
Chloride: 104 mmol/L (ref 98–111)
Chloride: 104 mmol/L (ref 98–111)
Chloride: 104 mmol/L (ref 98–111)
Chloride: 103 mmol/L (ref 98–111)
Chloride: 104 mmol/L (ref 98–111)
Creatinine, Ser: 0.5 mg/dL — ABNORMAL LOW (ref 0.61–1.24)
Creatinine, Ser: 0.6 mg/dL — ABNORMAL LOW (ref 0.61–1.24)
Creatinine, Ser: 0.6 mg/dL — ABNORMAL LOW (ref 0.61–1.24)
Creatinine, Ser: 0.5 mg/dL — ABNORMAL LOW (ref 0.61–1.24)
Creatinine, Ser: 0.6 mg/dL — ABNORMAL LOW (ref 0.61–1.24)
Glucose, Bld: 101 mg/dL — ABNORMAL HIGH (ref 70–99)
Glucose, Bld: 110 mg/dL — ABNORMAL HIGH (ref 70–99)
Glucose, Bld: 128 mg/dL — ABNORMAL HIGH (ref 70–99)
Glucose, Bld: 145 mg/dL — ABNORMAL HIGH (ref 70–99)
Glucose, Bld: 150 mg/dL — ABNORMAL HIGH (ref 70–99)
HCT: 40 % (ref 39.0–52.0)
HCT: 40 % (ref 39.0–52.0)
HCT: 33 % — ABNORMAL LOW (ref 39.0–52.0)
HCT: 29 % — ABNORMAL LOW (ref 39.0–52.0)
HCT: 29 % — ABNORMAL LOW (ref 39.0–52.0)
Hemoglobin: 13.6 g/dL (ref 13.0–17.0)
Hemoglobin: 13.6 g/dL (ref 13.0–17.0)
Hemoglobin: 11.2 g/dL — ABNORMAL LOW (ref 13.0–17.0)
Hemoglobin: 9.9 g/dL — ABNORMAL LOW (ref 13.0–17.0)
Hemoglobin: 9.9 g/dL — ABNORMAL LOW (ref 13.0–17.0)
Potassium: 4.3 mmol/L (ref 3.5–5.1)
Potassium: 4.9 mmol/L (ref 3.5–5.1)
Potassium: 5.5 mmol/L — ABNORMAL HIGH (ref 3.5–5.1)
Potassium: 5.5 mmol/L — ABNORMAL HIGH (ref 3.5–5.1)
Potassium: 4.6 mmol/L (ref 3.5–5.1)
Sodium: 138 mmol/L (ref 135–145)
Sodium: 137 mmol/L (ref 135–145)
Sodium: 136 mmol/L (ref 135–145)
Sodium: 136 mmol/L (ref 135–145)
Sodium: 138 mmol/L (ref 135–145)
TCO2: 26 mmol/L (ref 22–32)
TCO2: 24 mmol/L (ref 22–32)
TCO2: 25 mmol/L (ref 22–32)
TCO2: 23 mmol/L (ref 22–32)
TCO2: 23 mmol/L (ref 22–32)

## 2020-05-21 LAB — BASIC METABOLIC PANEL
Anion gap: 11 (ref 5–15)
Anion gap: 9 (ref 5–15)
BUN: 11 mg/dL (ref 6–20)
BUN: 12 mg/dL (ref 6–20)
CO2: 22 mmol/L (ref 22–32)
CO2: 24 mmol/L (ref 22–32)
Calcium: 9.3 mg/dL (ref 8.9–10.3)
Calcium: 8.3 mg/dL — ABNORMAL LOW (ref 8.9–10.3)
Chloride: 105 mmol/L (ref 98–111)
Chloride: 103 mmol/L (ref 98–111)
Creatinine, Ser: 0.87 mg/dL (ref 0.61–1.24)
Creatinine, Ser: 0.77 mg/dL (ref 0.61–1.24)
GFR, Estimated: >60 mL/min (ref >60)
GFR, Estimated: >60 mL/min (ref >60)
Glucose, Bld: 100 mg/dL — ABNORMAL HIGH (ref 70–99)
Glucose, Bld: 131 mg/dL — ABNORMAL HIGH (ref 70–99)
Potassium: 4.2 mmol/L (ref 3.5–5.1)
Potassium: 4.6 mmol/L (ref 3.5–5.1)
Sodium: 138 mmol/L (ref 135–145)
Sodium: 136 mmol/L (ref 135–145)

## 2020-05-21 LAB — GLUCOSE, CAPILLARY: Glucose-Capillary: 115 mg/dL — ABNORMAL HIGH (ref 70–99)

## 2020-05-21 LAB — POCT I-STAT 7, (LYTES, BLD GAS, ICA,H+H)
Acid-Base Excess: 1 mmol/L (ref 0.0–2.0)
Acid-Base Excess: 1 mmol/L (ref 0.0–2.0)
Acid-base deficit: 3 mmol/L — ABNORMAL HIGH (ref 0.0–2.0)
Acid-base deficit: 6 mmol/L — ABNORMAL HIGH (ref 0.0–2.0)
Acid-base deficit: 8 mmol/L — ABNORMAL HIGH (ref 0.0–2.0)
Bicarbonate: 26.9 mmol/L (ref 20.0–28.0)
Bicarbonate: 25.3 mmol/L (ref 20.0–28.0)
Bicarbonate: 25.2 mmol/L (ref 20.0–28.0)
Bicarbonate: 20.9 mmol/L (ref 20.0–28.0)
Bicarbonate: 16 mmol/L — ABNORMAL LOW (ref 20.0–28.0)
Calcium, Ion: 1.07 mmol/L — ABNORMAL LOW (ref 1.15–1.40)
Calcium, Ion: 1.15 mmol/L (ref 1.15–1.40)
Calcium, Ion: 1.36 mmol/L (ref 1.15–1.40)
Calcium, Ion: 1.24 mmol/L (ref 1.15–1.40)
Calcium, Ion: 1.01 mmol/L — ABNORMAL LOW (ref 1.15–1.40)
HCT: 30 % — ABNORMAL LOW (ref 39.0–52.0)
HCT: 32 % — ABNORMAL LOW (ref 39.0–52.0)
HCT: 42 % (ref 39.0–52.0)
HCT: 32 % — ABNORMAL LOW (ref 39.0–52.0)
HCT: 25 % — ABNORMAL LOW (ref 39.0–52.0)
Hemoglobin: 10.2 g/dL — ABNORMAL LOW (ref 13.0–17.0)
Hemoglobin: 10.9 g/dL — ABNORMAL LOW (ref 13.0–17.0)
Hemoglobin: 14.3 g/dL (ref 13.0–17.0)
Hemoglobin: 10.9 g/dL — ABNORMAL LOW (ref 13.0–17.0)
Hemoglobin: 8.5 g/dL — ABNORMAL LOW (ref 13.0–17.0)
O2 Saturation: 100 %
O2 Saturation: 100 %
O2 Saturation: 100 %
O2 Saturation: 93 %
O2 Saturation: 98 %
Patient temperature: 37.1
Patient temperature: 37.3
Patient temperature: 37.6
Potassium: 4.8 mmol/L (ref 3.5–5.1)
Potassium: 5.7 mmol/L — ABNORMAL HIGH (ref 3.5–5.1)
Potassium: 4.2 mmol/L (ref 3.5–5.1)
Potassium: 4.3 mmol/L (ref 3.5–5.1)
Potassium: 3.5 mmol/L (ref 3.5–5.1)
Sodium: 138 mmol/L (ref 135–145)
Sodium: 138 mmol/L (ref 135–145)
Sodium: 136 mmol/L (ref 135–145)
Sodium: 140 mmol/L (ref 135–145)
Sodium: 145 mmol/L (ref 135–145)
TCO2: 28 mmol/L (ref 22–32)
TCO2: 27 mmol/L (ref 22–32)
TCO2: 27 mmol/L (ref 22–32)
TCO2: 22 mmol/L (ref 22–32)
TCO2: 17 mmol/L — ABNORMAL LOW (ref 22–32)
pCO2 arterial: 45.8 mmHg (ref 32.0–48.0)
pCO2 arterial: 39.6 mmHg (ref 32.0–48.0)
pCO2 arterial: 55.6 mmHg — ABNORMAL HIGH (ref 32.0–48.0)
pCO2 arterial: 45.1 mmHg (ref 32.0–48.0)
pCO2 arterial: 27.2 mmHg — ABNORMAL LOW (ref 32.0–48.0)
pH, Arterial: 7.377 (ref 7.350–7.450)
pH, Arterial: 7.414 (ref 7.350–7.450)
pH, Arterial: 7.266 — ABNORMAL LOW (ref 7.350–7.450)
pH, Arterial: 7.275 — ABNORMAL LOW (ref 7.350–7.450)
pH, Arterial: 7.379 (ref 7.350–7.450)
pO2, Arterial: 217 mmHg — ABNORMAL HIGH (ref 83.0–108.0)
pO2, Arterial: 231 mmHg — ABNORMAL HIGH (ref 83.0–108.0)
pO2, Arterial: 226 mmHg — ABNORMAL HIGH (ref 83.0–108.0)
pO2, Arterial: 77 mmHg — ABNORMAL LOW (ref 83.0–108.0)
pO2, Arterial: 102 mmHg (ref 83.0–108.0)

## 2020-05-21 LAB — URINALYSIS, ROUTINE W REFLEX MICROSCOPIC
Bilirubin Urine: NEGATIVE
Glucose, UA: NEGATIVE mg/dL
Hgb urine dipstick: NEGATIVE
Ketones, ur: NEGATIVE mg/dL
Leukocytes,Ua: NEGATIVE
Nitrite: NEGATIVE
Protein, ur: NEGATIVE mg/dL
Specific Gravity, Urine: 1.008 (ref 1.005–1.030)
pH: 6 (ref 5.0–8.0)

## 2020-05-21 LAB — CBC
HCT: 43.2 % (ref 39.0–52.0)
HCT: 36.1 % — ABNORMAL LOW (ref 39.0–52.0)
HCT: 34.1 % — ABNORMAL LOW (ref 39.0–52.0)
Hemoglobin: 14.6 g/dL (ref 13.0–17.0)
Hemoglobin: 11.7 g/dL — ABNORMAL LOW (ref 13.0–17.0)
Hemoglobin: 11.2 g/dL — ABNORMAL LOW (ref 13.0–17.0)
MCH: 32.2 pg (ref 26.0–34.0)
MCH: 31.5 pg (ref 26.0–34.0)
MCH: 31.3 pg (ref 26.0–34.0)
MCHC: 33.8 g/dL (ref 30.0–36.0)
MCHC: 32.4 g/dL (ref 30.0–36.0)
MCHC: 32.8 g/dL (ref 30.0–36.0)
MCV: 95.4 fL (ref 80.0–100.0)
MCV: 97.3 fL (ref 80.0–100.0)
MCV: 95.3 fL (ref 80.0–100.0)
Platelets: 436 10*3/uL — ABNORMAL HIGH (ref 150–400)
Platelets: 270 10*3/uL (ref 150–400)
Platelets: 268 10*3/uL (ref 150–400)
RBC: 4.53 MIL/uL (ref 4.22–5.81)
RBC: 3.71 MIL/uL — ABNORMAL LOW (ref 4.22–5.81)
RBC: 3.58 MIL/uL — ABNORMAL LOW (ref 4.22–5.81)
RDW: 12.8 % (ref 11.5–15.5)
RDW: 12.7 % (ref 11.5–15.5)
RDW: 12.7 % (ref 11.5–15.5)
WBC: 14.9 10*3/uL — ABNORMAL HIGH (ref 4.0–10.5)
WBC: 29.9 10*3/uL — ABNORMAL HIGH (ref 4.0–10.5)
WBC: 19 10*3/uL — ABNORMAL HIGH (ref 4.0–10.5)
nRBC: 0 % (ref 0.0–0.2)
nRBC: 0 % (ref 0.0–0.2)
nRBC: 0 % (ref 0.0–0.2)

## 2020-05-21 LAB — HEMOGLOBIN AND HEMATOCRIT, BLOOD
HCT: 32.2 % — ABNORMAL LOW (ref 39.0–52.0)
Hemoglobin: 10.3 g/dL — ABNORMAL LOW (ref 13.0–17.0)

## 2020-05-21 LAB — POCT I-STAT EG7
Acid-Base Excess: 1 mmol/L (ref 0.0–2.0)
Bicarbonate: 27.5 mmol/L (ref 20.0–28.0)
Calcium, Ion: 1.13 mmol/L — ABNORMAL LOW (ref 1.15–1.40)
HCT: 31 % — ABNORMAL LOW (ref 39.0–52.0)
Hemoglobin: 10.5 g/dL — ABNORMAL LOW (ref 13.0–17.0)
O2 Saturation: 73 %
Potassium: 5.2 mmol/L — ABNORMAL HIGH (ref 3.5–5.1)
Sodium: 138 mmol/L (ref 135–145)
TCO2: 29 mmol/L (ref 22–32)
pCO2, Ven: 50.3 mmHg (ref 44.0–60.0)
pH, Ven: 7.346 (ref 7.250–7.430)
pO2, Ven: 42 mmHg (ref 32.0–45.0)

## 2020-05-21 LAB — PROTIME-INR
INR: 1.3 — ABNORMAL HIGH (ref 0.8–1.2)
Prothrombin Time: 16 seconds — ABNORMAL HIGH (ref 11.4–15.2)

## 2020-05-21 LAB — POCT ACTIVATED CLOTTING TIME
Activated Clotting Time: 261 seconds
Activated Clotting Time: 136 seconds

## 2020-05-21 LAB — ECHO INTRAOPERATIVE TEE: Weight: 3140.8 oz

## 2020-05-21 LAB — APTT: aPTT: 44 seconds — ABNORMAL HIGH (ref 24–36)

## 2020-05-21 LAB — MAGNESIUM: Magnesium: 2.6 mg/dL — ABNORMAL HIGH (ref 1.7–2.4)

## 2020-05-21 LAB — PLATELET COUNT: Platelets: 330 10*3/uL (ref 150–400)

## 2020-05-21 SURGERY — CORONARY ARTERY BYPASS GRAFTING (CABG)
Anesthesia: General | Site: Leg Upper | Laterality: Right

## 2020-05-21 MED ORDER — SODIUM CHLORIDE 0.9 % IV SOLN: 250.0000 mL | INTRAVENOUS | Status: DC

## 2020-05-21 MED ORDER — FENTANYL CITRATE (PF) 250 MCG/5ML IJ SOLN
INTRAMUSCULAR | Status: AC
  Filled 2020-05-21: qty 25

## 2020-05-21 MED ORDER — ASPIRIN 81 MG PO CHEW
324.0000 mg | CHEWABLE_TABLET | Freq: Every day | ORAL | Status: DC
  Administered 2020-05-25: 324 mg
  Filled 2020-05-21 (×3): qty 4

## 2020-05-21 MED ORDER — MIDAZOLAM HCL (PF) 10 MG/2ML IJ SOLN
INTRAMUSCULAR | Status: AC
  Filled 2020-05-21: qty 2

## 2020-05-21 MED ORDER — BISACODYL 10 MG RE SUPP
10.0000 mg | Freq: Every day | RECTAL | Status: DC
  Filled 2020-05-21: qty 1

## 2020-05-21 MED ORDER — BISACODYL 5 MG PO TBEC
10.0000 mg | DELAYED_RELEASE_TABLET | Freq: Every day | ORAL | Status: DC
  Administered 2020-05-22 – 2020-05-26 (×5): 10 mg via ORAL
  Filled 2020-05-21 (×5): qty 2

## 2020-05-21 MED ORDER — FAMOTIDINE 20 MG IN NS 100 ML IVPB
20.0000 mg | Freq: Two times a day (BID) | INTRAVENOUS | Status: DC
  Administered 2020-05-21: 20 mg via INTRAVENOUS
  Filled 2020-05-21 (×2): qty 100

## 2020-05-21 MED ORDER — HEPARIN SODIUM (PORCINE) 1000 UNIT/ML IJ SOLN
INTRAMUSCULAR | Status: DC | PRN
  Administered 2020-05-21: 28000 [IU] via INTRAVENOUS

## 2020-05-21 MED ORDER — ALBUMIN HUMAN 5 % IV SOLN: INTRAVENOUS | Status: DC | PRN

## 2020-05-21 MED ORDER — ASPIRIN EC 325 MG PO TBEC
325.0000 mg | DELAYED_RELEASE_TABLET | Freq: Every day | ORAL | Status: DC
  Administered 2020-05-22 – 2020-05-26 (×4): 325 mg via ORAL
  Filled 2020-05-21 (×5): qty 1

## 2020-05-21 MED ORDER — ACETAMINOPHEN 500 MG PO TABS
1000.0000 mg | ORAL_TABLET | Freq: Four times a day (QID) | ORAL | Status: DC
  Administered 2020-05-22 – 2020-05-26 (×19): 1000 mg via ORAL
  Filled 2020-05-21 (×19): qty 2

## 2020-05-21 MED ORDER — LIDOCAINE 2% (20 MG/ML) 5 ML SYRINGE
INTRAMUSCULAR | Status: AC
  Filled 2020-05-21: qty 5

## 2020-05-21 MED ORDER — SODIUM CHLORIDE 0.9% FLUSH
3.0000 mL | Freq: Two times a day (BID) | INTRAVENOUS | Status: DC
  Administered 2020-05-22 – 2020-05-26 (×7): 3 mL via INTRAVENOUS

## 2020-05-21 MED ORDER — INSULIN REGULAR(HUMAN) IN NACL 100-0.9 UT/100ML-% IV SOLN
INTRAVENOUS | Status: DC
  Administered 2020-05-21: 1.9 [IU]/h via INTRAVENOUS

## 2020-05-21 MED ORDER — HEPARIN SODIUM (PORCINE) 1000 UNIT/ML IJ SOLN
INTRAMUSCULAR | Status: AC
  Filled 2020-05-21: qty 1

## 2020-05-21 MED ORDER — MIDAZOLAM HCL 2 MG/2ML IJ SOLN
2.0000 mg | INTRAMUSCULAR | Status: DC | PRN
Start: 2020-05-21 — End: 2020-05-23
  Administered 2020-05-21: 2 mg via INTRAVENOUS
  Filled 2020-05-21: qty 2

## 2020-05-21 MED ORDER — ACETAMINOPHEN 160 MG/5ML PO SOLN: 1000.0000 mg | Freq: Four times a day (QID) | ORAL | Status: DC

## 2020-05-21 MED ORDER — ACETAMINOPHEN 160 MG/5ML PO SOLN: 650.0000 mg | Freq: Once | ORAL | Status: AC

## 2020-05-21 MED ORDER — ARTIFICIAL TEARS OPHTHALMIC OINT
TOPICAL_OINTMENT | OPHTHALMIC | Status: DC | PRN
  Administered 2020-05-21: 1 via OPHTHALMIC

## 2020-05-21 MED ORDER — ROCURONIUM BROMIDE 10 MG/ML (PF) SYRINGE
PREFILLED_SYRINGE | INTRAVENOUS | Status: AC
  Filled 2020-05-21: qty 10

## 2020-05-21 MED ORDER — SODIUM CHLORIDE 0.9% FLUSH: 3.0000 mL | INTRAVENOUS | Status: DC | PRN

## 2020-05-21 MED ORDER — DOBUTAMINE IN D5W 4-5 MG/ML-% IV SOLN
2.5000 ug/kg/min | INTRAVENOUS | Status: AC
  Administered 2020-05-21: 2.5 ug/kg/min via INTRAVENOUS
  Filled 2020-05-21: qty 250

## 2020-05-21 MED ORDER — NICARDIPINE HCL IN NACL 20-0.86 MG/200ML-% IV SOLN
0.0000 mg/h | INTRAVENOUS | Status: DC
  Filled 2020-05-21: qty 200

## 2020-05-21 MED ORDER — PROTAMINE SULFATE 10 MG/ML IV SOLN
INTRAVENOUS | Status: AC
  Filled 2020-05-21: qty 25

## 2020-05-21 MED ORDER — ROCURONIUM BROMIDE 10 MG/ML (PF) SYRINGE
PREFILLED_SYRINGE | INTRAVENOUS | Status: DC | PRN
  Administered 2020-05-21 (×5): 100 mg via INTRAVENOUS

## 2020-05-21 MED ORDER — CHLORHEXIDINE GLUCONATE CLOTH 2 % EX PADS: 6.0000 | MEDICATED_PAD | Freq: Every day | CUTANEOUS | Status: DC

## 2020-05-21 MED ORDER — SUCCINYLCHOLINE CHLORIDE 200 MG/10ML IV SOSY
PREFILLED_SYRINGE | INTRAVENOUS | Status: AC
  Filled 2020-05-21: qty 10

## 2020-05-21 MED ORDER — TRAMADOL HCL 50 MG PO TABS
50.0000 mg | ORAL_TABLET | ORAL | Status: DC | PRN
  Administered 2020-05-22 – 2020-05-23 (×6): 100 mg via ORAL
  Filled 2020-05-21 (×6): qty 2

## 2020-05-21 MED ORDER — ONDANSETRON HCL 4 MG/2ML IJ SOLN: 4.0000 mg | Freq: Four times a day (QID) | INTRAMUSCULAR | Status: DC | PRN

## 2020-05-21 MED ORDER — 0.9 % SODIUM CHLORIDE (POUR BTL) OPTIME
TOPICAL | Status: DC | PRN
  Administered 2020-05-21: 5000 mL

## 2020-05-21 MED ORDER — SODIUM CHLORIDE 0.45 % IV SOLN: INTRAVENOUS | Status: DC | PRN

## 2020-05-21 MED ORDER — DEXTROSE 50 % IV SOLN: 0.0000 mL | INTRAVENOUS | Status: DC | PRN

## 2020-05-21 MED ORDER — ACETAMINOPHEN 650 MG RE SUPP
650.0000 mg | Freq: Once | RECTAL | Status: AC
  Administered 2020-05-21: 650 mg via RECTAL

## 2020-05-21 MED ORDER — LACTATED RINGERS IV SOLN: INTRAVENOUS | Status: DC | PRN

## 2020-05-21 MED ORDER — CHLORHEXIDINE GLUCONATE 0.12 % MT SOLN: 15.0000 mL | OROMUCOSAL | Status: AC

## 2020-05-21 MED ORDER — STERILE WATER FOR IRRIGATION IR SOLN
Status: DC | PRN
Start: 2020-05-21 — End: 2020-05-21
  Administered 2020-05-21: 2000 mL

## 2020-05-21 MED ORDER — NITROGLYCERIN IN D5W 200-5 MCG/ML-% IV SOLN: 0.0000 ug/min | INTRAVENOUS | Status: DC

## 2020-05-21 MED ORDER — SODIUM BICARBONATE 8.4 % IV SOLN
50.0000 meq | Freq: Once | INTRAVENOUS | Status: AC
  Administered 2020-05-21: 50 meq via INTRAVENOUS

## 2020-05-21 MED ORDER — LACTATED RINGERS IV SOLN: 500.0000 mL | Freq: Once | INTRAVENOUS | Status: DC | PRN

## 2020-05-21 MED ORDER — PHENYLEPHRINE HCL-NACL 20-0.9 MG/250ML-% IV SOLN: 0.0000 ug/min | INTRAVENOUS | Status: DC

## 2020-05-21 MED ORDER — PHENYLEPHRINE 40 MCG/ML (10ML) SYRINGE FOR IV PUSH (FOR BLOOD PRESSURE SUPPORT)
PREFILLED_SYRINGE | INTRAVENOUS | Status: AC
  Filled 2020-05-21: qty 10

## 2020-05-21 MED ORDER — ACETAMINOPHEN 500 MG PO TABS: 1000.0000 mg | ORAL_TABLET | Freq: Once | ORAL | Status: DC

## 2020-05-21 MED ORDER — MORPHINE SULFATE (PF) 2 MG/ML IV SOLN
1.0000 mg | INTRAVENOUS | Status: DC | PRN
  Administered 2020-05-21 – 2020-05-22 (×7): 2 mg via INTRAVENOUS
  Administered 2020-05-22: 4 mg via INTRAVENOUS
  Administered 2020-05-23 – 2020-05-24 (×3): 2 mg via INTRAVENOUS
  Filled 2020-05-21 (×3): qty 1
  Filled 2020-05-21 (×2): qty 2
  Filled 2020-05-21 (×4): qty 1

## 2020-05-21 MED ORDER — INSULIN ASPART 100 UNIT/ML ~~LOC~~ SOLN
0.0000 [IU] | SUBCUTANEOUS | Status: DC
  Administered 2020-05-21 – 2020-05-22 (×3): 2 [IU] via SUBCUTANEOUS

## 2020-05-21 MED ORDER — SODIUM CHLORIDE 0.9 % IV SOLN
1.5000 g | Freq: Two times a day (BID) | INTRAVENOUS | Status: AC
  Administered 2020-05-21 – 2020-05-23 (×4): 1.5 g via INTRAVENOUS
  Filled 2020-05-21 (×4): qty 1.5

## 2020-05-21 MED ORDER — PLASMA-LYTE 148 IV SOLN
INTRAVENOUS | Status: DC | PRN
  Administered 2020-05-21: 500 mL

## 2020-05-21 MED ORDER — SODIUM CHLORIDE (PF) 0.9 % IJ SOLN
OROMUCOSAL | Status: DC | PRN
  Administered 2020-05-21 (×3): 4 mL via TOPICAL

## 2020-05-21 MED ORDER — ARTIFICIAL TEARS OPHTHALMIC OINT
TOPICAL_OINTMENT | OPHTHALMIC | Status: AC
  Filled 2020-05-21: qty 3.5

## 2020-05-21 MED ORDER — ALBUMIN HUMAN 5 % IV SOLN
250.0000 mL | INTRAVENOUS | Status: AC | PRN
  Administered 2020-05-21 (×3): 12.5 g via INTRAVENOUS

## 2020-05-21 MED ORDER — PROTAMINE SULFATE 10 MG/ML IV SOLN
INTRAVENOUS | Status: DC | PRN
  Administered 2020-05-21: 80 mg via INTRAVENOUS
  Administered 2020-05-21: 50 mg via INTRAVENOUS
  Administered 2020-05-21: 90 mg via INTRAVENOUS
  Administered 2020-05-21: 30 mg via INTRAVENOUS

## 2020-05-21 MED ORDER — METOPROLOL TARTRATE 5 MG/5ML IV SOLN: 2.5000 mg | INTRAVENOUS | Status: DC | PRN

## 2020-05-21 MED ORDER — PHENYLEPHRINE 40 MCG/ML (10ML) SYRINGE FOR IV PUSH (FOR BLOOD PRESSURE SUPPORT)
PREFILLED_SYRINGE | INTRAVENOUS | Status: DC | PRN
  Administered 2020-05-21: 80 ug via INTRAVENOUS

## 2020-05-21 MED ORDER — DOCUSATE SODIUM 100 MG PO CAPS
200.0000 mg | ORAL_CAPSULE | Freq: Every day | ORAL | Status: DC
  Administered 2020-05-22 – 2020-05-26 (×5): 200 mg via ORAL
  Filled 2020-05-21 (×5): qty 2

## 2020-05-21 MED ORDER — VANCOMYCIN HCL IN DEXTROSE 1-5 GM/200ML-% IV SOLN
1000.0000 mg | Freq: Once | INTRAVENOUS | Status: AC
  Administered 2020-05-21: 1000 mg via INTRAVENOUS
  Filled 2020-05-21: qty 200

## 2020-05-21 MED ORDER — METOPROLOL TARTRATE 25 MG/10 ML ORAL SUSPENSION: 12.5000 mg | Freq: Two times a day (BID) | ORAL | Status: DC

## 2020-05-21 MED ORDER — PHENYLEPHRINE HCL-NACL 10-0.9 MG/250ML-% IV SOLN
INTRAVENOUS | Status: DC | PRN
  Administered 2020-05-21: 20 ug/min via INTRAVENOUS

## 2020-05-21 MED ORDER — CHLORHEXIDINE GLUCONATE CLOTH 2 % EX PADS
6.0000 | MEDICATED_PAD | Freq: Every day | CUTANEOUS | Status: DC
  Administered 2020-05-21 – 2020-05-26 (×6): 6 via TOPICAL

## 2020-05-21 MED ORDER — EPHEDRINE 5 MG/ML INJ
INTRAVENOUS | Status: AC
  Filled 2020-05-21: qty 10

## 2020-05-21 MED ORDER — SODIUM CHLORIDE (PF) 0.9 % IJ SOLN
INTRAMUSCULAR | Status: AC
  Filled 2020-05-21: qty 10

## 2020-05-21 MED ORDER — MIDAZOLAM HCL 2 MG/2ML IJ SOLN
INTRAMUSCULAR | Status: AC
  Filled 2020-05-21: qty 2

## 2020-05-21 MED ORDER — PROPOFOL 10 MG/ML IV BOLUS
INTRAVENOUS | Status: DC | PRN
  Administered 2020-05-21: 50 mg via INTRAVENOUS

## 2020-05-21 MED ORDER — LACTATED RINGERS IV SOLN: INTRAVENOUS | Status: DC

## 2020-05-21 MED ORDER — POTASSIUM CHLORIDE 10 MEQ/50ML IV SOLN
10.0000 meq | INTRAVENOUS | Status: AC
Start: 2020-05-21 — End: 2020-05-21

## 2020-05-21 MED ORDER — FENTANYL CITRATE (PF) 250 MCG/5ML IJ SOLN
INTRAMUSCULAR | Status: DC | PRN
  Administered 2020-05-21: 100 ug via INTRAVENOUS
  Administered 2020-05-21: 500 ug via INTRAVENOUS
  Administered 2020-05-21: 150 ug via INTRAVENOUS
  Administered 2020-05-21: 50 ug via INTRAVENOUS
  Administered 2020-05-21: 100 ug via INTRAVENOUS
  Administered 2020-05-21 (×2): 50 ug via INTRAVENOUS
  Administered 2020-05-21: 150 ug via INTRAVENOUS
  Administered 2020-05-21 (×2): 50 ug via INTRAVENOUS

## 2020-05-21 MED ORDER — METOPROLOL TARTRATE 12.5 MG HALF TABLET
12.5000 mg | ORAL_TABLET | Freq: Two times a day (BID) | ORAL | Status: DC
  Administered 2020-05-22: 12.5 mg via ORAL
  Filled 2020-05-21: qty 1

## 2020-05-21 MED ORDER — SODIUM CHLORIDE 0.9 % IV SOLN: INTRAVENOUS | Status: DC

## 2020-05-21 MED ORDER — OXYCODONE HCL 5 MG PO TABS
5.0000 mg | ORAL_TABLET | ORAL | Status: DC | PRN
  Administered 2020-05-21 – 2020-05-26 (×23): 10 mg via ORAL
  Filled 2020-05-21 (×23): qty 2

## 2020-05-21 MED ORDER — MAGNESIUM SULFATE 4 GM/100ML IV SOLN
4.0000 g | Freq: Once | INTRAVENOUS | Status: AC
  Administered 2020-05-21: 4 g via INTRAVENOUS
  Filled 2020-05-21: qty 100

## 2020-05-21 MED ORDER — DEXMEDETOMIDINE HCL IN NACL 400 MCG/100ML IV SOLN
0.0000 ug/kg/h | INTRAVENOUS | Status: DC
  Administered 2020-05-21: 0.7 ug/kg/h via INTRAVENOUS
  Filled 2020-05-21: qty 100

## 2020-05-21 MED ORDER — CALCIUM CHLORIDE 10 % IV SOLN
1.0000 g | Freq: Once | INTRAVENOUS | Status: AC
  Administered 2020-05-21: 1 g via INTRAVENOUS

## 2020-05-21 MED ORDER — DOBUTAMINE IN D5W 4-5 MG/ML-% IV SOLN: 0.0000 ug/kg/min | INTRAVENOUS | Status: DC

## 2020-05-21 MED ORDER — NOREPINEPHRINE 4 MG/250ML-% IV SOLN: 0.0000 ug/min | INTRAVENOUS | Status: DC

## 2020-05-21 MED ORDER — MIDAZOLAM HCL 5 MG/5ML IJ SOLN
INTRAMUSCULAR | Status: DC | PRN
  Administered 2020-05-21 (×2): 1 mg via INTRAVENOUS
  Administered 2020-05-21 (×2): 2 mg via INTRAVENOUS
  Administered 2020-05-21: 1 mg via INTRAVENOUS
  Administered 2020-05-21 (×2): 2 mg via INTRAVENOUS
  Administered 2020-05-21 (×2): 5 mg via INTRAVENOUS
  Administered 2020-05-21: 1 mg via INTRAVENOUS

## 2020-05-21 MED ORDER — PROTAMINE SULFATE 10 MG/ML IV SOLN
INTRAVENOUS | Status: AC
  Filled 2020-05-21: qty 5

## 2020-05-21 MED ORDER — SODIUM CHLORIDE 0.9 % IV SOLN: INTRAVENOUS | Status: DC | PRN

## 2020-05-21 MED ORDER — PROPOFOL 10 MG/ML IV BOLUS
INTRAVENOUS | Status: AC
  Filled 2020-05-21: qty 20

## 2020-05-21 MED ORDER — PANTOPRAZOLE SODIUM 40 MG PO TBEC
40.0000 mg | DELAYED_RELEASE_TABLET | Freq: Every day | ORAL | Status: DC
  Administered 2020-05-23 – 2020-05-26 (×4): 40 mg via ORAL
  Filled 2020-05-21 (×4): qty 1

## 2020-05-21 MED FILL — Nitroglycerin IV Soln 100 MCG/ML in D5W: INTRA_ARTERIAL | Qty: 10 | Status: AC

## 2020-05-21 MED FILL — Heparin Sodium (Porcine) Inj 1000 Unit/ML: INTRAMUSCULAR | Qty: 10 | Status: AC

## 2020-05-21 SURGICAL SUPPLY — 85 items
BAG DECANTER FOR FLEXI CONT (MISCELLANEOUS) ×4 IMPLANT
BLADE CLIPPER SURG (BLADE) ×4 IMPLANT
BLADE STERNUM SYSTEM 6 (BLADE) ×4 IMPLANT
BNDG ELASTIC 4X5.8 VLCR STR LF (GAUZE/BANDAGES/DRESSINGS) ×4 IMPLANT
BNDG ELASTIC 6X10 VLCR STRL LF (GAUZE/BANDAGES/DRESSINGS) ×4 IMPLANT
BNDG ELASTIC 6X5.8 VLCR STR LF (GAUZE/BANDAGES/DRESSINGS) ×4 IMPLANT
BNDG GAUZE ELAST 4 BULKY (GAUZE/BANDAGES/DRESSINGS) ×4 IMPLANT
CABLE SURGICAL S-101-97-12 (CABLE) ×4 IMPLANT
CANISTER SUCT 3000ML PPV (MISCELLANEOUS) ×4 IMPLANT
CANNULA MC2 2 STG 29/37 NON-V (CANNULA) ×3 IMPLANT
CANNULA MC2 TWO STAGE (CANNULA) ×1
CANNULA NON VENT 20FR 12 (CANNULA) ×4 IMPLANT
CATH ROBINSON RED A/P 18FR (CATHETERS) ×8 IMPLANT
CLIP RETRACTION 3.0MM CORONARY (MISCELLANEOUS) ×4 IMPLANT
CLIP VESOCCLUDE MED 24/CT (CLIP) IMPLANT
CLIP VESOCCLUDE SM WIDE 24/CT (CLIP) IMPLANT
CONN ST 1/2X1/2  BEN (MISCELLANEOUS) ×1
CONN ST 1/2X1/2 BEN (MISCELLANEOUS) ×3 IMPLANT
CONNECTOR BLAKE 2:1 CARIO BLK (MISCELLANEOUS) ×4 IMPLANT
DEFOGGER ANTIFOG KIT (MISCELLANEOUS) ×4 IMPLANT
DERMABOND ADVANCED (GAUZE/BANDAGES/DRESSINGS) ×1
DERMABOND ADVANCED .7 DNX12 (GAUZE/BANDAGES/DRESSINGS) ×3 IMPLANT
DRAIN CHANNEL 19F RND (DRAIN) ×8 IMPLANT
DRAIN CONNECTOR BLAKE 1:1 (MISCELLANEOUS) IMPLANT
DRAPE CARDIOVASCULAR INCISE (DRAPES) ×1
DRAPE INCISE IOBAN 66X45 STRL (DRAPES) IMPLANT
DRAPE SLUSH/WARMER DISC (DRAPES) ×4 IMPLANT
DRAPE SRG 135X102X78XABS (DRAPES) ×3 IMPLANT
DRSG AQUACEL AG ADV 3.5X10 (GAUZE/BANDAGES/DRESSINGS) ×4 IMPLANT
DRSG AQUACEL AG ADV 3.5X14 (GAUZE/BANDAGES/DRESSINGS) ×4 IMPLANT
DRSG COVADERM 4X14 (GAUZE/BANDAGES/DRESSINGS) IMPLANT
ELECT BLADE 4.0 EZ CLEAN MEGAD (MISCELLANEOUS) ×4
ELECT REM PT RETURN 9FT ADLT (ELECTROSURGICAL) ×8
ELECTRODE BLDE 4.0 EZ CLN MEGD (MISCELLANEOUS) ×3 IMPLANT
ELECTRODE REM PT RTRN 9FT ADLT (ELECTROSURGICAL) ×6 IMPLANT
FELT TEFLON 1X6 (MISCELLANEOUS) ×4 IMPLANT
GAUZE SPONGE 4X4 12PLY STRL (GAUZE/BANDAGES/DRESSINGS) ×8 IMPLANT
GLOVE BIO SURGEON STRL SZ7 (GLOVE) ×12 IMPLANT
GLOVE BIOGEL M STRL SZ7.5 (GLOVE) ×8 IMPLANT
GLOVE ECLIPSE 6.5 STRL STRAW (GLOVE) ×4 IMPLANT
GLOVE SURG UNDER POLY LF SZ6.5 (GLOVE) ×4 IMPLANT
GOWN STRL REUS W/ TWL LRG LVL3 (GOWN DISPOSABLE) ×30 IMPLANT
GOWN STRL REUS W/ TWL XL LVL3 (GOWN DISPOSABLE) ×6 IMPLANT
GOWN STRL REUS W/TWL LRG LVL3 (GOWN DISPOSABLE) ×10
GOWN STRL REUS W/TWL XL LVL3 (GOWN DISPOSABLE) ×2
HEMOSTAT POWDER SURGIFOAM 1G (HEMOSTASIS) ×12 IMPLANT
INSERT SUTURE HOLDER (MISCELLANEOUS) ×4 IMPLANT
KIT BASIN OR (CUSTOM PROCEDURE TRAY) ×4 IMPLANT
KIT DRAINAGE VACCUM ASSIST (KITS) ×4 IMPLANT
KIT SUCTION CATH 14FR (SUCTIONS) ×4 IMPLANT
KIT TURNOVER KIT B (KITS) ×4 IMPLANT
KIT VASOVIEW HEMOPRO 2 VH 4000 (KITS) ×4 IMPLANT
LEAD PACING MYOCARDI (MISCELLANEOUS) ×4 IMPLANT
MARKER GRAFT CORONARY BYPASS (MISCELLANEOUS) ×16 IMPLANT
NS IRRIG 1000ML POUR BTL (IV SOLUTION) ×20 IMPLANT
PACK ACCESSORY CANNULA KIT (KITS) ×4 IMPLANT
PACK E OPEN HEART (SUTURE) ×4 IMPLANT
PACK OPEN HEART (CUSTOM PROCEDURE TRAY) ×4 IMPLANT
PAD ARMBOARD 7.5X6 YLW CONV (MISCELLANEOUS) ×8 IMPLANT
PAD ELECT DEFIB RADIOL ZOLL (MISCELLANEOUS) ×4 IMPLANT
PENCIL BUTTON HOLSTER BLD 10FT (ELECTRODE) ×4 IMPLANT
POSITIONER HEAD DONUT 9IN (MISCELLANEOUS) ×4 IMPLANT
PUNCH AORTIC ROTATE 4.0MM (MISCELLANEOUS) ×4 IMPLANT
SET CARDIOPLEGIA MPS 5001102 (MISCELLANEOUS) ×4 IMPLANT
SUPPORT HEART JANKE-BARRON (MISCELLANEOUS) ×4 IMPLANT
SUT BONE WAX W31G (SUTURE) ×4 IMPLANT
SUT ETHIBOND X763 2 0 SH 1 (SUTURE) ×8 IMPLANT
SUT MNCRL AB 3-0 PS2 18 (SUTURE) ×8 IMPLANT
SUT MNCRL AB 4-0 PS2 18 (SUTURE) ×4 IMPLANT
SUT PDS AB 1 CTX 36 (SUTURE) ×8 IMPLANT
SUT PROLENE 4 0 SH DA (SUTURE) ×4 IMPLANT
SUT PROLENE 5 0 C 1 36 (SUTURE) ×16 IMPLANT
SUT PROLENE 7 0 BV1 MDA (SUTURE) ×8 IMPLANT
SUT STEEL 6MS V (SUTURE) IMPLANT
SUT STEEL STERNAL CCS#1 18IN (SUTURE) ×12 IMPLANT
SUT VIC AB 2-0 CT1 27 (SUTURE) ×1
SUT VIC AB 2-0 CT1 TAPERPNT 27 (SUTURE) ×3 IMPLANT
SYSTEM SAHARA CHEST DRAIN ATS (WOUND CARE) ×4 IMPLANT
TAPE CLOTH SURG 4X10 WHT LF (GAUZE/BANDAGES/DRESSINGS) ×8 IMPLANT
TOWEL GREEN STERILE (TOWEL DISPOSABLE) ×4 IMPLANT
TOWEL GREEN STERILE FF (TOWEL DISPOSABLE) ×4 IMPLANT
TRAY FOLEY SLVR 16FR TEMP STAT (SET/KITS/TRAYS/PACK) ×4 IMPLANT
TUBING LAP HI FLOW INSUFFLATIO (TUBING) ×4 IMPLANT
UNDERPAD 30X36 HEAVY ABSORB (UNDERPADS AND DIAPERS) ×4 IMPLANT
WATER STERILE IRR 1000ML POUR (IV SOLUTION) ×8 IMPLANT

## 2020-05-21 NOTE — Anesthesia Procedure Notes (Signed)
Procedure Name: Intubation Date/Time: 05/21/2020 7:54 AM Performed by: Nils Pyle, CRNA Pre-anesthesia Checklist: Patient identified, Emergency Drugs available, Suction available and Patient being monitored Patient Re-evaluated:Patient Re-evaluated prior to induction Oxygen Delivery Method: Circle System Utilized Preoxygenation: Pre-oxygenation with 100% oxygen Induction Type: IV induction Ventilation: Mask ventilation without difficulty and Oral airway inserted - appropriate to patient size Laryngoscope Size: Hyacinth Meeker and 2 Grade View: Grade I Tube type: Oral Tube size: 8.0 mm Number of attempts: 1 Airway Equipment and Method: Stylet and Oral airway Placement Confirmation: ETT inserted through vocal cords under direct vision,  positive ETCO2 and breath sounds checked- equal and bilateral Secured at: 23 cm Tube secured with: Tape Dental Injury: Teeth and Oropharynx as per pre-operative assessment

## 2020-05-21 NOTE — Brief Op Note (Signed)
05/17/2020 - 05/21/2020  1:22 PM  PATIENT:  Matthew Tapia  54 y.o. male  PRE-OPERATIVE DIAGNOSIS: CAD  POST-OPERATIVE DIAGNOSIS:  CAD  PROCEDURE:  Procedure(s) with comments: CORONARY ARTERY BYPASS GRAFTING TIMES FOUR USING LEFT INTERNAL MAMMARY ARTERY  AND ENDOSCOPICALLY HARVESTED RIGHT GREATER SAPHANOUS VEIN. (N/A) - flow track TRANSESOPHAGEAL ECHOCARDIOGRAM (TEE) (N/A) ENDOVEIN HARVEST OF RIGHT GREATER SAPHENOUS VEIN (Right) LIMA-LAD SVG-OM1 SVG-OM2 SVG-PD EVH- 65 MINUTES  SURGEON:  Surgeon(s) and Role:    * Lightfoot, Eliezer Lofts, MD - Primary  PHYSICIAN ASSISTANT: Astor Gentle PA-C  ASSISTANTS: STAFF   ANESTHESIA:   general  EBL:  1178 mL   BLOOD ADMINISTERED:none  DRAINS: LEFT PLEURAL AND MEDIASTINAL CHEST TUBES   LOCAL MEDICATIONS USED:  NONE  SPECIMEN:  No Specimen  DISPOSITION OF SPECIMEN:  N/A  COUNTS:  YES  TOURNIQUET:  * No tourniquets in log *  DICTATION: .Dragon Dictation  PLAN OF CARE: Admit to inpatient   PATIENT DISPOSITION:  ICU - intubated and hemodynamically stable.   Delay start of Pharmacological VTE agent (>24hrs) due to surgical blood loss or risk of bleeding: yes  COMPLICATIONS: NO KNOWN

## 2020-05-21 NOTE — Progress Notes (Signed)
[facility] BRIEF SICU PROGRESS NOTE  Day of Surgery  S/P Procedure(s) (LRB): CORONARY ARTERY BYPASS GRAFTING TIMES FOUR USING LEFT INTERNAL MAMMARY ARTERY  AND ENDOSCOPICALLY HARVESTED RIGHT GREATER SAPHANOUS VEIN. (N/A) TRANSESOPHAGEAL ECHOCARDIOGRAM (TEE) (N/A) ENDOVEIN HARVEST OF RIGHT GREATER SAPHENOUS VEIN (Right)   Extubated uneventfully Complaining of pain in chest related to surgery NSR w/ stable hemodynamics Chest tube output low UOP adequate Labs okay  Plan: Continue routine early postop  Purcell Nails, MD 05/21/2020 6:52 PM

## 2020-05-21 NOTE — Op Note (Signed)
[address]       [name] [address]             [phone]                                          05/21/2020 Patient:  Matthew Tapia Pre-Op Dx: NSTEMI   3v CAD   Post-op Dx:  same Procedure: CABG X 4.  LIMA LAD, RSVG PDA, OM1, OM2   Endoscopic greater saphenous vein harvest on the right Intra-operative Transesophageal Echocardiogram  Surgeon and Role:      * Dilynn Munroe, Eliezer Lofts, MD - Primary    Webb Laws, PA-C - assisting  Anesthesia  general EBL:  500 ml Blood Administration: none Xclamp Time:  61 min Pump Time:   Drains: 40 F blake drain: L, mediastinal  Wires: ventricular Counts: correct   Indications: 54 yo male transferred from [facility] with an NSTEMI.  He underwent balloon angioplasty to the right system, and was placed on Aggrastat.  He underwent a second angioplasty 1 day prior to surgery due to re-occlusion of the right system due to partial interruption of his anticoagulation  Findings: Good LIMA, good vein.  Good target on the LAD.  Small PDA.  Good sized OM1.  Pt had reduced function on post CPB TEE mostly with inferior wall hypokinesis.  Inotropic and vasopressor support was started, and after a few minutes we were able to separate for CPB on the 3rd attempt.  He remained stable, and prior to closure we were able to decrease much of his chemical support.  Operative Technique: All invasive lines were placed in pre-op holding.  After the risks, benefits and alternatives were thoroughly discussed, the patient was brought to the operative theatre.  Anesthesia was induced, and the patient was prepped and draped in normal sterile fashion.  An appropriate surgical pause was performed, and pre-operative antibiotics were dosed accordingly.  We began with simultaneous incisions along the righ leg for harvesting of the greater saphenous vein and the chest for the sternotomy.  In regards to the sternotomy, this was  carried down with bovie cautery, and the sternum was divided with a reciprocating saw.  Meticulous hemostasis was obtained.  The left internal thoracic artery was exposed and harvested in in pedicled fashion.  The patient was systemically heparinized, and the artery was divided distally, and placed in a papaverine sponge.    The sternal elevator was removed, and a retractor was placed.  The pericardium was divided in the midline and fashioned into a cradle with pericardial stitches.   After we confirmed an appropriate ACT, the ascending aorta was cannulated in standard fashion.  The right atrial appendage was used for venous cannulation site.  Cardiopulmonary bypass was initiated, and the heart retractor was placed. The cross clamp was applied, and a dose of anterograde cardioplegia was given with good arrest of the heart.  We moved to the posterior wall of the heart, and found a good target on the PDA.  An arteriotomy was made, and the vein graft was anastomosed to it in an end to side fashion.  Next we exposed the lateral wall, and found a good target on the OM1, and OM2.  An end to side anastomosis with the vein graft to each were then created.  Finally, we exposed a good  target on the LAD, and fashioned an end to side anastomosis between it and the LITA.  We began to re-warm, and a re-animation dose of cardioplegia was given.  The heart was de-aired, and the cross clamp was removed.  Meticulous hemostasis was obtained.    A partial occludding clamp was then placed on the ascending aorta, and we created an end to side anastomosis between it and the proximal vein grafts.  The proximal sites were marked with rings.  Hemostasis was obtained, and we separated from cardiopulmonary bypass without event.the heparin was reversed with protamine.  Chest tubes and wires were placed, and the sternum was re-approximated with with sternal wires.  The soft tissue and skin were re-approximated wth absorbable suture.    The  patient tolerated the procedure without any immediate complications, and was transferred to the ICU in guarded condition.  Matthew Tapia

## 2020-05-21 NOTE — Anesthesia Procedure Notes (Signed)
Arterial Line Insertion Start/End12/28/2021 6:50 AM, 05/21/2020 7:00 AM Performed by: Nils Pyle, CRNA, CRNA  Patient location: Pre-op. Preanesthetic checklist: patient identified, IV checked, site marked, risks and benefits discussed, surgical consent, monitors and equipment checked, pre-op evaluation and anesthesia consent Lidocaine 1% used for infiltration and patient sedated Left, radial was placed Catheter size: 20 G Hand hygiene performed  and maximum sterile barriers used  Allen's test indicative of satisfactory collateral circulation Attempts: 1 Procedure performed without using ultrasound guided technique. Ultrasound Notes:anatomy identified, needle tip was noted to be adjacent to the nerve/plexus identified and no ultrasound evidence of intravascular and/or intraneural injection Following insertion, dressing applied and Biopatch. Post procedure assessment: normal  Patient tolerated the procedure well with no immediate complications.

## 2020-05-21 NOTE — Transfer of Care (Signed)
Immediate Anesthesia Transfer of Care Note  Patient: Matthew Tapia  Procedure(s) Performed: CORONARY ARTERY BYPASS GRAFTING TIMES FOUR USING LEFT INTERNAL MAMMARY ARTERY  AND ENDOSCOPICALLY HARVESTED RIGHT GREATER SAPHANOUS VEIN. (N/A Chest) TRANSESOPHAGEAL ECHOCARDIOGRAM (TEE) (N/A Esophagus) ENDOVEIN HARVEST OF RIGHT GREATER SAPHENOUS VEIN (Right Leg Upper)  Patient Location: ICU  Anesthesia Type:General  Level of Consciousness: sedated, unresponsive and Patient remains intubated per anesthesia plan  Airway & Oxygen Therapy: Patient remains intubated per anesthesia plan and Patient placed on Ventilator (see vital sign flow sheet for setting)  Post-op Assessment: Report given to RN and Post -op Vital signs reviewed and stable  Post vital signs: Reviewed and stable  Last Vitals:  Vitals Value Taken Time  BP 122/79 05/21/20 1334  Temp    Pulse 82 05/21/20 1338  Resp 11 05/21/20 1338  SpO2 100 % 05/21/20 1338  Vitals shown include unvalidated device data.  Last Pain:  Vitals:   05/21/20 0419  TempSrc: Oral  PainSc:       Patients Stated Pain Goal: 0 (05/20/20 1640)  Complications: No complications documented.

## 2020-05-21 NOTE — Plan of Care (Signed)

## 2020-05-21 NOTE — Anesthesia Procedure Notes (Signed)
Central Venous Catheter Insertion Performed by: Roderic Palau, MD, anesthesiologist Start/End12/28/2021 6:45 AM, 05/21/2020 7:00 AM Patient location: Pre-op. Preanesthetic checklist: patient identified, IV checked, site marked, risks and benefits discussed, surgical consent, monitors and equipment checked, pre-op evaluation, timeout performed and anesthesia consent Position: Trendelenburg Lidocaine 1% used for infiltration and patient sedated Hand hygiene performed , maximum sterile barriers used  and Seldinger technique used Catheter size: 9 Fr Total catheter length 10. Central line was placed.MAC introducer Procedure performed using ultrasound guided technique. Ultrasound Notes:anatomy identified, needle tip was noted to be adjacent to the nerve/plexus identified, no ultrasound evidence of intravascular and/or intraneural injection and image(s) printed for medical record Attempts: 1 Following insertion, line sutured, dressing applied and Biopatch. Post procedure assessment: blood return through all ports, free fluid flow and no air  Patient tolerated the procedure well with no immediate complications.

## 2020-05-21 NOTE — Procedures (Signed)
Extubation Procedure Note  Patient Details:   Name: Matthew Tapia DOB: [birth year] MRN: [id number]   Airway Documentation:  Airway 8 mm (Active)  Secured at (cm) 23 cm 05/21/20 1334  Measured From Lips 05/21/20 1334  Secured Location Right 05/21/20 1334  Secured By Pink Tape 05/21/20 1334  Tube Holder Repositioned Yes 05/21/20 1334  Prone position No 05/21/20 1334  Site Condition Dry 05/21/20 1334   Vent end date: (not recorded) Vent end time: (not recorded)   Evaluation  O2 sats: stable throughout Complications: No apparent complications Patient did tolerate procedure well. Bilateral Breath Sounds: Clear   Yes, patient able to speak.  Patient extubated at 1620 per rapid wean protocol. Patient placed on 4L nasal cannula. Tolerated well. Vitals stable.  Farris Has 05/21/2020, 4:46 PM

## 2020-05-21 NOTE — Anesthesia Postprocedure Evaluation (Signed)
Anesthesia Post Note  Patient: Matthew Tapia  Procedure(s) Performed: CORONARY ARTERY BYPASS GRAFTING TIMES FOUR USING LEFT INTERNAL MAMMARY ARTERY  AND ENDOSCOPICALLY HARVESTED RIGHT GREATER SAPHANOUS VEIN. (N/A Chest) TRANSESOPHAGEAL ECHOCARDIOGRAM (TEE) (N/A Esophagus) ENDOVEIN HARVEST OF RIGHT GREATER SAPHENOUS VEIN (Right Leg Upper)     Patient location during evaluation: SICU Anesthesia Type: General Level of consciousness: sedated Pain management: pain level controlled Vital Signs Assessment: post-procedure vital signs reviewed and stable Respiratory status: patient remains intubated per anesthesia plan Cardiovascular status: stable Postop Assessment: no apparent nausea or vomiting Anesthetic complications: no   No complications documented.  Last Vitals:  Vitals:   05/21/20 0419 05/21/20 1334  BP: 114/66   Pulse: 83 95  Resp: (!) 21 14  Temp: 37.1 C   SpO2: 96%     Last Pain:  Vitals:   05/21/20 0419  TempSrc: Oral  PainSc:                  Dishon Kehoe,W. EDMOND

## 2020-05-21 NOTE — Progress Notes (Signed)
NIF -40. VC 800. Following commands. No positive cuff leak. Surgeon notified.

## 2020-05-21 NOTE — Progress Notes (Signed)
     [address]       [place] [address]             [phone]      No events  Vitals:   05/20/20 2333 05/21/20 0419  BP: 109/65 114/66  Pulse: 88 83  Resp: (!) 23 (!) 21  Temp: 98.9 F (37.2 C) 98.8 F (37.1 C)  SpO2: 96% 96%    Alert NAD Sinus EWOB  OR today for CABG 4-5

## 2020-05-21 NOTE — Progress Notes (Signed)
RT unable to obtain ABG at this time. Patient refused any further attempts.

## 2020-05-21 NOTE — Progress Notes (Signed)
  Echocardiogram Echocardiogram Transesophageal has been performed.  Augustine Radar 05/21/2020, 8:13 AM

## 2020-05-22 ENCOUNTER — Inpatient Hospital Stay (HOSPITAL_COMMUNITY): Payer: Self-pay

## 2020-05-22 ENCOUNTER — Encounter (HOSPITAL_COMMUNITY): Payer: Self-pay | Admitting: Thoracic Surgery (Cardiothoracic Vascular Surgery)

## 2020-05-22 LAB — CBC
HCT: 33.2 % — ABNORMAL LOW (ref 39.0–52.0)
HCT: 32.9 % — ABNORMAL LOW (ref 39.0–52.0)
Hemoglobin: 10.9 g/dL — ABNORMAL LOW (ref 13.0–17.0)
Hemoglobin: 11.2 g/dL — ABNORMAL LOW (ref 13.0–17.0)
MCH: 31.6 pg (ref 26.0–34.0)
MCH: 32.5 pg (ref 26.0–34.0)
MCHC: 32.8 g/dL (ref 30.0–36.0)
MCHC: 34 g/dL (ref 30.0–36.0)
MCV: 96.2 fL (ref 80.0–100.0)
MCV: 95.4 fL (ref 80.0–100.0)
Platelets: 275 10*3/uL (ref 150–400)
Platelets: 260 10*3/uL (ref 150–400)
RBC: 3.45 MIL/uL — ABNORMAL LOW (ref 4.22–5.81)
RBC: 3.45 MIL/uL — ABNORMAL LOW (ref 4.22–5.81)
RDW: 12.9 % (ref 11.5–15.5)
RDW: 13 % (ref 11.5–15.5)
WBC: 18.7 10*3/uL — ABNORMAL HIGH (ref 4.0–10.5)
WBC: 20.1 10*3/uL — ABNORMAL HIGH (ref 4.0–10.5)
nRBC: 0 % (ref 0.0–0.2)
nRBC: 0 % (ref 0.0–0.2)

## 2020-05-22 LAB — BASIC METABOLIC PANEL
Anion gap: 9 (ref 5–15)
Anion gap: 10 (ref 5–15)
BUN: 11 mg/dL (ref 6–20)
BUN: 13 mg/dL (ref 6–20)
CO2: 24 mmol/L (ref 22–32)
CO2: 24 mmol/L (ref 22–32)
Calcium: 8.3 mg/dL — ABNORMAL LOW (ref 8.9–10.3)
Calcium: 8.7 mg/dL — ABNORMAL LOW (ref 8.9–10.3)
Chloride: 101 mmol/L (ref 98–111)
Chloride: 99 mmol/L (ref 98–111)
Creatinine, Ser: 0.84 mg/dL (ref 0.61–1.24)
Creatinine, Ser: 0.86 mg/dL (ref 0.61–1.24)
GFR, Estimated: >60 mL/min (ref >60)
GFR, Estimated: >60 mL/min (ref >60)
Glucose, Bld: 133 mg/dL — ABNORMAL HIGH (ref 70–99)
Glucose, Bld: 119 mg/dL — ABNORMAL HIGH (ref 70–99)
Potassium: 4.6 mmol/L (ref 3.5–5.1)
Potassium: 5.1 mmol/L (ref 3.5–5.1)
Sodium: 134 mmol/L — ABNORMAL LOW (ref 135–145)
Sodium: 133 mmol/L — ABNORMAL LOW (ref 135–145)

## 2020-05-22 LAB — MAGNESIUM
Magnesium: 2.3 mg/dL (ref 1.7–2.4)
Magnesium: 2.1 mg/dL (ref 1.7–2.4)

## 2020-05-22 LAB — GLUCOSE, CAPILLARY
Glucose-Capillary: 109 mg/dL — ABNORMAL HIGH (ref 70–99)
Glucose-Capillary: 113 mg/dL — ABNORMAL HIGH (ref 70–99)
Glucose-Capillary: 115 mg/dL — ABNORMAL HIGH (ref 70–99)
Glucose-Capillary: 116 mg/dL — ABNORMAL HIGH (ref 70–99)
Glucose-Capillary: 117 mg/dL — ABNORMAL HIGH (ref 70–99)
Glucose-Capillary: 121 mg/dL — ABNORMAL HIGH (ref 70–99)
Glucose-Capillary: 129 mg/dL — ABNORMAL HIGH (ref 70–99)
Glucose-Capillary: 135 mg/dL — ABNORMAL HIGH (ref 70–99)
Glucose-Capillary: 141 mg/dL — ABNORMAL HIGH (ref 70–99)
Glucose-Capillary: 141 mg/dL — ABNORMAL HIGH (ref 70–99)
Glucose-Capillary: 161 mg/dL — ABNORMAL HIGH (ref 70–99)
Glucose-Capillary: 120 mg/dL — ABNORMAL HIGH (ref 70–99)
Glucose-Capillary: 131 mg/dL — ABNORMAL HIGH (ref 70–99)

## 2020-05-22 MED ORDER — LIDOCAINE 5 % EX PTCH
1.0000 | MEDICATED_PATCH | CUTANEOUS | Status: DC
  Administered 2020-05-22 – 2020-05-26 (×5): 1 via TRANSDERMAL
  Filled 2020-05-22 (×5): qty 1

## 2020-05-22 MED ORDER — METOPROLOL TARTRATE 25 MG PO TABS
25.0000 mg | ORAL_TABLET | Freq: Two times a day (BID) | ORAL | Status: DC
  Administered 2020-05-22 – 2020-05-26 (×8): 25 mg via ORAL
  Filled 2020-05-22 (×8): qty 1

## 2020-05-22 MED ORDER — INSULIN ASPART 100 UNIT/ML ~~LOC~~ SOLN
0.0000 [IU] | Freq: Three times a day (TID) | SUBCUTANEOUS | Status: DC
  Administered 2020-05-22: 2 [IU] via SUBCUTANEOUS
  Administered 2020-05-22: 4 [IU] via SUBCUTANEOUS
  Administered 2020-05-22 – 2020-05-24 (×3): 2 [IU] via SUBCUTANEOUS

## 2020-05-22 MED ORDER — ENOXAPARIN SODIUM 40 MG/0.4ML ~~LOC~~ SOLN
40.0000 mg | Freq: Every day | SUBCUTANEOUS | Status: DC
Start: 2020-05-22 — End: 2020-05-26
  Administered 2020-05-22 – 2020-05-25 (×4): 40 mg via SUBCUTANEOUS
  Filled 2020-05-22 (×4): qty 0.4

## 2020-05-22 MED ORDER — INSULIN ASPART 100 UNIT/ML ~~LOC~~ SOLN: 0.0000 [IU] | SUBCUTANEOUS | Status: DC

## 2020-05-22 MED ORDER — GUAIFENESIN-DM 100-10 MG/5ML PO SYRP
5.0000 mL | ORAL_SOLUTION | ORAL | Status: DC | PRN
  Administered 2020-05-22: 5 mL via ORAL
  Filled 2020-05-22: qty 5

## 2020-05-22 MED FILL — Electrolyte-R (PH 7.4) Solution: INTRAVENOUS | Qty: 5000 | Status: AC

## 2020-05-22 MED FILL — Heparin Sodium (Porcine) Inj 1000 Unit/ML: INTRAMUSCULAR | Qty: 30 | Status: AC

## 2020-05-22 MED FILL — Sodium Bicarbonate IV Soln 8.4%: INTRAVENOUS | Qty: 50 | Status: AC

## 2020-05-22 MED FILL — Sodium Chloride IV Soln 0.9%: INTRAVENOUS | Qty: 2000 | Status: AC

## 2020-05-22 MED FILL — Calcium Chloride Inj 10%: INTRAVENOUS | Qty: 10 | Status: AC

## 2020-05-22 MED FILL — Magnesium Sulfate Inj 50%: INTRAMUSCULAR | Qty: 10 | Status: AC

## 2020-05-22 MED FILL — Potassium Chloride Inj 2 mEq/ML: INTRAVENOUS | Qty: 40 | Status: AC

## 2020-05-22 MED FILL — Mannitol IV Soln 20%: INTRAVENOUS | Qty: 500 | Status: AC

## 2020-05-22 NOTE — Addendum Note (Signed)
Addendum  created 05/22/20 0925 by Adair Laundry, CRNA   Order list changed, Pharmacy for encounter modified

## 2020-05-22 NOTE — Plan of Care (Signed)
  Problem: Pain Managment: Goal: General experience of comfort will improve Outcome: Progressing   Problem: Coping: Goal: Level of anxiety will decrease Outcome: Progressing   Problem: Clinical Measurements: Goal: Respiratory complications will improve Outcome: Progressing

## 2020-05-22 NOTE — Progress Notes (Signed)
      [address]       [name] [address]             [phone]      PM rounds  POD # 1 CABG  BP (!) 118/100   Pulse 86   Temp 97.9 F (36.6 C) (Oral)   Resp (!) 28   Ht 5\' 6"  (1.676 m)   Wt 94.9 kg   SpO2 92%   BMI 33.77 kg/m  96% on 2L [place]  Intake/Output Summary (Last 24 hours) at 05/22/2020 1730 Last data filed at 05/22/2020 1600 Gross per 24 hour  Intake 1706.19 ml  Output 1540 ml  Net 166.19 ml   CBG well controlled  Hct= 33, creatinine 0.86, K= 5.1  Doing well POD # 1  Nelton Amsden C. 05/24/2020, MD [facility] [phone]

## 2020-05-22 NOTE — Plan of Care (Signed)
  Problem: Clinical Measurements: Goal: Ability to maintain clinical measurements within normal limits will improve Outcome: Progressing   Problem: Clinical Measurements: Goal: Respiratory complications will improve Outcome: Progressing   Problem: Clinical Measurements: Goal: Cardiovascular complication will be avoided Outcome: Progressing   

## 2020-05-22 NOTE — Progress Notes (Signed)
Progress Note  Patient Name: Matthew SiaGary D Granholm Date of Encounter: 05/22/2020  Primary Cardiologist: Dr, Kirke CorinArida  Subjective   Day 1 s/p CABG  Inpatient Medications    Scheduled Meds: . acetaminophen  1,000 mg Oral Q6H   Or  . acetaminophen (TYLENOL) oral liquid 160 mg/5 mL  1,000 mg Per Tube Q6H  . aspirin EC  325 mg Oral Daily   Or  . aspirin  324 mg Per Tube Daily  . atorvastatin  80 mg Oral Daily  . bisacodyl  10 mg Oral Daily   Or  . bisacodyl  10 mg Rectal Daily  . chlorhexidine  15 mL Mouth/Throat NOW  . Chlorhexidine Gluconate Cloth  6 each Topical Daily  . docusate sodium  200 mg Oral Daily  . insulin aspart  0-24 Units Subcutaneous Q4H  . metoprolol tartrate  12.5 mg Oral BID   Or  . metoprolol tartrate  12.5 mg Per Tube BID  . [START ON 05/23/2020] pantoprazole  40 mg Oral Daily  . sodium chloride flush  3 mL Intravenous Q12H   Continuous Infusions: . sodium chloride 10 mL/hr at 05/22/20 0800  . sodium chloride    . sodium chloride    . albumin human Stopped (05/22/20 0752)  . cefUROXime (ZINACEF)  IV Stopped (05/22/20 0354)  . dexmedetomidine (PRECEDEX) IV infusion Stopped (05/21/20 1624)  . DOBUTamine Stopped (05/21/20 1500)  . lactated ringers    . lactated ringers    . lactated ringers Stopped (05/22/20 0528)  . niCARDipine    . nitroGLYCERIN    . norepinephrine (LEVOPHED) Adult infusion    . phenylephrine (NEO-SYNEPHRINE) Adult infusion Stopped (05/21/20 1516)   PRN Meds: sodium chloride, albumin human, dextrose, lactated ringers, metoprolol tartrate, midazolam, morphine injection, ondansetron (ZOFRAN) IV, oxyCODONE, sodium chloride flush, traMADol   Vital Signs    Vitals:   05/22/20 0600 05/22/20 0700 05/22/20 0746 05/22/20 0800  BP:  107/79  110/73  Pulse: (!) 104 90  92  Resp: (!) 40 (!) 28  (!) 24  Temp:   98.7 F (37.1 C)   TempSrc:      SpO2: 90% 96%  96%  Weight: 94.9 kg     Height:        Intake/Output Summary (Last 24 hours)  at 05/22/2020 0810 Last data filed at 05/22/2020 0800 Gross per 24 hour  Intake 5356.81 ml  Output 4350 ml  Net 1006.81 ml    I/O since admission: -4717  Filed Weights   05/21/20 0419 05/21/20 2000 05/22/20 0600  Weight: 89 kg 89 kg 94.9 kg    Telemetry    Sinus in the 90s- Personally Reviewed  ECG    ECG (independently read by me): NSR 89; Inferior Q waves, ST elevation has returned to baseline, QTc 461  Physical Exam    BP 110/73   Pulse 92   Temp 98.7 F (37.1 C)   Resp (!) 24   Ht 5\' 6"  (1.676 m)   Wt 94.9 kg   SpO2 96%   BMI 33.77 kg/m  General: Alert, oriented, no distress.  Skin: normal turgor, no rashes, warm and dry HEENT: Normocephalic, atraumatic. Pupils equal round and reactive to light; sclera anicteric; extraocular muscles intact;  Nose without nasal septal hypertrophy Mouth/Parynx benign; Mallinpatti scale Neck: No JVD, no carotid bruits; normal carotid upstroke Lungs: decreased BS, no wheezing Chest wall:; CT in place Heart: PMI not displaced, RRR, s1 s2 normal, 1/6 systolic murmur, no diastolic murmur, soft  rub Abdomen: soft, nontender; no hepatosplenomehaly, BS+; abdominal aorta nontender and not dilated by palpation. Back: no CVA tenderness Pulses 2+ Musculoskeletal: full range of motion, normal strength, no joint deformities Extremities: no significant edema Neurologic: grossly nonfocal; Cranial nerves grossly wnl Psychologic: Normal mood and affect   Labs    Chemistry Recent Labs  Lab 05/21/20 0559 05/21/20 0809 05/21/20 1235 05/21/20 1406 05/21/20 1727 05/21/20 2021 05/22/20 0404  NA 138   < > 138   < > 145 136 134*  K 4.2   < > 4.6   < > 3.5 4.6 4.6  CL 105   < > 104  --   --  103 101  CO2 22  --   --   --   --  24 24  GLUCOSE 100*   < > 150*  --   --  131* 133*  BUN 11   < > 12  --   --  12 11  CREATININE 0.87   < > 0.60*  --   --  0.77 0.84  CALCIUM 9.3  --   --   --   --  8.3* 8.3*  GFRNONAA >60  --   --   --   --   >60 >60  ANIONGAP 11  --   --   --   --  9 9   < > = values in this interval not displayed.     Hematology Recent Labs  Lab 05/21/20 1413 05/21/20 1607 05/21/20 1727 05/21/20 2021 05/22/20 0404  WBC 29.9*  --   --  19.0* 18.7*  RBC 3.71*  --   --  3.58* 3.45*  HGB 11.7*   < > 8.5* 11.2* 10.9*  HCT 36.1*   < > 25.0* 34.1* 33.2*  MCV 97.3  --   --  95.3 96.2  MCH 31.5  --   --  31.3 31.6  MCHC 32.4  --   --  32.8 32.8  RDW 12.7  --   --  12.7 12.9  PLT 270  --   --  268 275   < > = values in this interval not displayed.    Cardiac EnzymesNo results for input(s): TROPONINI in the last 168 hours. No results for input(s): TROPIPOC in the last 168 hours.   BNPNo results for input(s): BNP, PROBNP in the last 168 hours.   DDimer No results for input(s): DDIMER in the last 168 hours.   Lipid Panel     Component Value Date/Time   CHOL 121 05/15/2020 0322   TRIG 161 (H) 05/15/2020 0322   HDL 25 (L) 05/15/2020 0322   CHOLHDL 4.8 05/15/2020 0322   VLDL 32 05/15/2020 0322   LDLCALC 64 05/15/2020 0322     Radiology    DG Chest 2 View  Result Date: 05/20/2020 CLINICAL DATA:  Preoperative assessment, coronary artery disease, tobacco abuse EXAM: CHEST - 2 VIEW COMPARISON:  05/18/2020 FINDINGS: The heart size and mediastinal contours are within normal limits. Both lungs are clear. The visualized skeletal structures are unremarkable. IMPRESSION: No active cardiopulmonary disease. Electronically Signed   By: Sharlet Salina M.D.   On: 05/20/2020 21:59   DG Chest Port 1 View  Result Date: 05/22/2020 CLINICAL DATA:  Follow-up CABG EXAM: PORTABLE CHEST 1 VIEW COMPARISON:  05/21/2020 FINDINGS: Endotracheal tube is been removed. Nasogastric tube is been removed. Left internal jugular central line tip again in the innominate vein. Left chest tube remains in place. No pneumothorax.  Minimal atelectasis at both lung bases following extubation. IMPRESSION: Minimal atelectasis at the lung bases  following extubation. No pneumothorax. Electronically Signed   By: Paulina Fusi M.D.   On: 05/22/2020 07:58   DG Chest Port 1 View  Result Date: 05/21/2020 CLINICAL DATA:  Status post CABG. EXAM: PORTABLE CHEST 1 VIEW COMPARISON:  One-view chest x-ray 06/20/2018 FINDINGS: Patient is status post median sternotomy. Graft markers for CABG noted. Mediastinal drain and left-sided chest tube in place. No pneumothorax is present. A left IJ line terminates in the innominate vein. Side port of the NG tube is just beyond the GE junction. Lung volumes are low. IMPRESSION: 1. Status post median sternotomy and CABG. 2. Support apparatus as above. 3. No pneumothorax. Electronically Signed   By: Marin Roberts M.D.   On: 05/21/2020 13:57   ECHO INTRAOPERATIVE TEE  Result Date: 05/21/2020  *INTRAOPERATIVE TRANSESOPHAGEAL REPORT *  Patient Name:   HUMBERTO ADDO Date of Exam: 05/21/2020 Medical Rec #:  [id number]      Height:       66.0 in Accession #:    [id number]     Weight:       196.3 lb Date of Birth:  02/02/1966      BSA:          1.98 m Patient Age:    54 years       BP:           121/84 mmHg Patient Gender: M              HR:           70 bpm. Exam Location:  Anesthesiology Transesophogeal exam was perform intraoperatively during surgical procedure. Patient was closely monitored under general anesthesia during the entirety of examination. Indications:     Coronary Artery Disease Sonographer:     Eulah Pont RDCS Performing Phys: [id number] Eliezer Lofts LIGHTFOOT Diagnosing Phys: Gaynelle Adu MD Complications: No known complications during this procedure. POST-OP IMPRESSIONS Overall, there were no significant changes from pre-bypass. PRE-OP FINDINGS  Left Ventricle: The left ventricle has moderately reduced systolic function, with an ejection fraction of 35-40%. The cavity size was left ventricular size was not assessed. There is no increase in left ventricular wall thickness. Right Ventricle: The right  ventricle has normal systolic function. The cavity was not assessed. There is no increase in right ventricular wall thickness. Left Atrium: Left atrial size was not assessed. The left atrial appendage is well visualized and there is no evidence of thrombus present. Right Atrium: Right atrial size was not assessed. Interatrial Septum: No atrial level shunt detected by color flow Doppler. Pericardium: There is no evidence of pericardial effusion. Mitral Valve: The mitral valve is normal in structure. Mitral valve regurgitation is mild by color flow Doppler. The MR jet is centrally-directed. Tricuspid Valve: The tricuspid valve was normal in structure. Tricuspid valve regurgitation was not visualized by color flow Doppler. Aortic Valve: The aortic valve is normal in structure. Aortic valve regurgitation was not visualized by color flow Doppler. Pulmonic Valve: The pulmonic valve was normal in structure. Pulmonic valve regurgitation is not visualized by color flow Doppler. +--------------+--------++ LEFT VENTRICLE         +--------------+--------++ PLAX 2D                +--------------+--------++ LVOT diam:    2.55 cm  +--------------+--------++ LVOT Area:    5.11 cm +--------------+--------++                        +--------------+--------++  +--------------+-------+  SHUNTS                +--------------+-------+ Systemic Diam:2.55 cm +--------------+-------+  Gaynelle Adu MD Electronically signed by Gaynelle Adu MD Signature Date/Time: 05/21/2020/1:57:54 PM    Final     Cardiac Studies   LHC 05/16/2020    Prox LAD to Mid LAD lesion is 70% stenosed.  Mid LAD to Dist LAD lesion is 40% stenosed.  Mid Cx to Dist Cx lesion is 95% stenosed.  2nd Mrg lesion is 80% stenosed.  Prox RCA to Dist RCA lesion is 80% stenosed.  1st Diag lesion is 99% stenosed.  1. Severe three-vessel coronary artery disease. Thrombus burden in the right coronary artery improved  significantly in the vessel has normal TIMI-3 flow but has diffuse disease from the proximal to the distal segment before the bifurcation. In addition, the patient seems to have significant proximal to mid LAD disease which was highly significant by fractional flow reserve evaluation with an IFR ratio of 0.76. There is complex mid left circumflex disease at the bifurcation of OM 2. There is also subtotal occlusion of first diagonal which seems to be diffusely diseased.  2. Left ventricular angiography was not performed. EF was 35 to 40% by echo. 3. Significant improvement in left ventricular end-diastolic pressure which is currently only mildly elevated at 17 mmHg.  Recommendations: Given diffuse disease in the right coronary artery and the presence of three-vessel coronary artery disease, recommend evaluation for CABG. I am going to discontinue treatment with Brilinta. Given residual thrombus in the right coronary artery, recommend continuing Aggrastat infusion until CABG. Suspect that CABG can be done early next week. I will make arrangements for transfer to [facility].   TTE 05/15/2020 40%. The  left ventricle has moderately decreased function. The left ventricle  demonstrates regional wall motion abnormalities (see scoring  diagram/findings for description). Left ventricular  diastolic parameters are consistent with Grade I diastolic dysfunction  (impaired relaxation). There is severe hypokinesis of the left  ventricular, entire inferolateral wall and inferior wall. The average left  ventricular global longitudinal strain is  -11.6 %. The global longitudinal strain is abnormal.  2. Right ventricular systolic function is normal. The right ventricular  size is normal.  3. The mitral valve is normal in structure. No evidence of mitral valve  regurgitation.  4. The aortic valve was not well visualized. Aortic valve regurgitation  is not visualized.  5. The inferior  vena cava is normal in size with greater than 50%  respiratory variability, suggesting right atrial pressure of 3 mmHg.    REPEAT CATH/PTCA: 05/20/2020  Mid LAD to Dist LAD lesion is 40% stenosed.  1st Diag lesion is 99% stenosed.  Prox LAD to Mid LAD lesion is 70% stenosed.  Mid Cx to Dist Cx lesion is 95% stenosed.  2nd Mrg lesion is 80% stenosed.  Prox RCA to Dist RCA lesion is 80% stenosed.  Prox RCA lesion is 100% stenosed.  Balloon angioplasty was performed using a BALLOON EMERGE MR 2.5X15.  Post intervention, there is a 80% residual stenosis.   1. Acute occlusion of the proximal RCA 2. Successful PTCA/ balloon angioplasty of the proximal RCA 3. No change in severe disease in the LAD and circumflex  Recommendations: Will continue ASA, statin and beta blocker. While awaiting bypass, will transition from Aggrastat to Cangrelor drip due to hospital shortage of Aggrastat. CT surgery notified of change in clinical situation. I think he is stable to wait for bypass tomorrow.  Will keep NPO for now.  Patient Profile     Matthew Blethen Pickardis a 54 y.o.malewith history of tobacco abuse who was admitted on 05/14/2020 for inferior STEMI to [facility]. He was found to have diffuse RCA thrombus. He underwent thrombectomy with angioplasty. He was unable to have full revascularization and recommended for CABG. He developed reocclusion of RCA and required repeat PTCA on 05/20/2020.  Assessment & Plan    1. Day 8 s/p inferior STEMI with reocclusion following aggrastat dc on 12/27/ requiring repeat PTCA of occluded RCA.  2. Day 1 s/p CABG with LIMA -LAD, SVG-OM1, SVG-OM2, SVG-PD by Dr. Cliffton Asters. Extubated last night.  3. Ischemic cardiomyopathy EF 40% pre CABG; on  Metoprolol 25 mg bid;  Off pressors;  Probably re-istitute ARB at later date  4. Hyperlipidemia: target LDL <70, on atorvastatin 80 mg  5. Tobacco abuse: cessation imperative   Signed, Lennette Bihari, MD,  [facility] 05/22/2020, 8:10 AM

## 2020-05-22 NOTE — Progress Notes (Addendum)
[address]       [place] [address]             [phone]                 1 Day Post-Op Procedure(s) (LRB): CORONARY ARTERY BYPASS GRAFTING TIMES FOUR USING LEFT INTERNAL MAMMARY ARTERY  AND ENDOSCOPICALLY HARVESTED RIGHT GREATER SAPHANOUS VEIN. (N/A) TRANSESOPHAGEAL ECHOCARDIOGRAM (TEE) (N/A) ENDOVEIN HARVEST OF RIGHT GREATER SAPHENOUS VEIN (Right)   Events: No overnight issues.  Mr. Karge sitting up in chair, main complaint is chest discomfort which increases with movement. Reports he has higher tolerance for pain medication. Also reports burping/belching and having flatus.  Tolerating PO clears.   _______________________________________________________________ Vitals: BP 110/73   Pulse 92   Temp 98.7 F (37.1 C)   Resp (!) 24   Ht 5\' 6"  (1.676 m)   Wt 94.9 kg   SpO2 96%   BMI 33.77 kg/m   - Neuro: Alert and oriented x 3.  Sensory intact.  Moving all extremities w/o difficulty.    - Cardiovascular: NSR on telemetry.  Regular rate and rhythm.  Dressing to chest intact.    CVP:  [5 mmHg-16 mmHg] 12 mmHg CO:  [4.2 L/min-5.8 L/min] 5.8 L/min  - Pulm: Lungs diminished in bases.  Mediastinal and pleural chest tube present.  CT output 537 over last 24H. Currently, 96% on 4L n/c.    ABG    Component Value Date/Time   PHART 7.379 05/21/2020 1727   PCO2ART 27.2 (L) 05/21/2020 1727   PO2ART 102 05/21/2020 1727   HCO3 16.0 (L) 05/21/2020 1727   TCO2 17 (L) 05/21/2020 1727   ACIDBASEDEF 8.0 (H) 05/21/2020 1727   O2SAT 98.0 05/21/2020 1727    - Abd: Abd soft, non-distended, non-tender.  BS hypoactive. Foley present.   - Extremity: Moving extremities w/o difficulty.  No swelling present.    .Intake/Output      12/28 0701 12/29 0700 12/29 0701 12/30 0700   P.O. 50    I.V. (mL/kg) 3452.9 (36.4) 20 (0.2)   Blood 785    IV Piggyback 1348.9    Total Intake(mL/kg) 5636.8 (59.4) 20 (0.2)   Urine (mL/kg/hr) 2550 (1.1) 75 (0.4)   Blood 1178     Chest Tube 537 10   Total Output 4265 85   Net +1371.8 -65           _______________________________________________________________ Labs: CBC Latest Ref Rng & Units 05/22/2020 05/21/2020 05/21/2020  WBC 4.0 - 10.5 K/uL 18.7(H) 19.0(H) -  Hemoglobin 13.0 - 17.0 g/dL 10.9(L) 11.2(L) 8.5(L)  Hematocrit 39.0 - 52.0 % 33.2(L) 34.1(L) 25.0(L)  Platelets 150 - 400 K/uL 275 268 -   CMP Latest Ref Rng & Units 05/22/2020 05/21/2020 05/21/2020  Glucose 70 - 99 mg/dL 05/23/2020) 017(P) -  BUN 6 - 20 mg/dL 11 12 -  Creatinine 102(H - 1.24 mg/dL 8.52 7.78 -  Sodium 2.42 - 145 mmol/L 134(L) 136 145  Potassium 3.5 - 5.1 mmol/L 4.6 4.6 3.5  Chloride 98 - 111 mmol/L 101 103 -  CO2 22 - 32 mmol/L 24 24 -  Calcium 8.9 - 10.3 mg/dL 8.3(L) 8.3(L) -  Total Protein 6.5 - 8.1 g/dL - - -  Total Bilirubin 0.3 - 1.2 mg/dL - - -  Alkaline Phos 38 - 126 U/L - - -  AST 15 - 41 U/L - - -  ALT 0 - 44 U/L - - -    CXR: Left  lower lobe atelectasis.    _______________________________________________________________  Assessment and Plan: POD #1  s/p CABG x 4 (LIMA-LAD, SVG-OM1, SVG-OM2, SVG-PD)  Neuro: Alert and oriented x 4.  Continue with tylenol Q6H; other pain meds PRN.  Add lidoderm patch to chest for pain control.   CV: NSR on tele and this AM EKG.  EF 35-40%. LDL 64.  Hemodynamics stable and off pressors.  Continue with metoprolol 12.5 BID, ASA 325 mg, lipitor 80 mg.    Pulm: CT output 537 mL. 96% 4L n/c diminished in bases w/ LLL atelectasis.  Continue to encourage ISB, OOB to chair.  Keep CT in today.    Renal: BUN/Cr stable,  UOP -2.6L.  Remove foley today.    GI: Tolerating clears w/ flatus.  Advance diet as tolerates to heart healthy.  Continue bowel regimen w/ pain medications.   Heme: EBL 1178 mL, cellsaver 785 mL.  H&H and plt stable.  Check CBC in am.    ID: max temp 38.1 w/ leukocytosis which is reactive from CABG.  Continue to monitor.   Endo: A1c 5.8%.  Glucose this AM 133.  SSI.    Dispo: Possible txf out of ICU tomorrow.     Agree with above Pt doing well Increasing BB, on asp/statin Floor tomorrow  Corliss Skains

## 2020-05-22 NOTE — Progress Notes (Signed)
Pt's daughter called this AM to discuss long-term plans and goals.  She advised that [facility] told her that they could offer Mr. Tibbs drug rehab after he is discharged.  She wanted to follow up with that.  She also advised that Mr. Shimabukuro may not know that she knows about his polysubstance abuse.    It is unclear at this time if Mr. Deery is amenable to drug rehab.    I left a voicemail with Steward Drone with Case Management.

## 2020-05-22 NOTE — Hospital Course (Addendum)
History of Present Illness:  [name] is a 54 yo male with history of substance abuse by self medicating with cocaine and amphetamines for anxiety and chronic pain, hypertension, dyslipidemia, and nicotine abuse.  He presented to [facility] ED with complaints of chest pain and associated shortness of breath.  These symptoms had been occurring for at least 2 weeks, but had gotten worse over the past several days.  Workup in the ED consisted of EKG which showed Q waves and inferior ST segment elevations.  His Troponin level was also positive.  He was treated with ASA and started on Heparin infusion.  He was taken to the catheterization lab urgently on 05/14/2020.  He was found to have multivessel CAD with thrombus in the RCA.  This was cleared with aspiration thrombectomy.  He was started on Aggrastat and Brilinta for this.  The patient was admitted to the hospital for further care.  He was taken back to the catheterization lab on 05/16/2020 which showed restoration of flow to the RCA and no further intervention was required.  It was felt the patient would require coronary bypass grafting for treatment of his CAD.  He was transferred to [facility] for further care.   Hospital Course:    The patient was evaluated by Dr. Laneta Simmers who felt he would best be treated with coronary bypass procedure.  The risks and benefits of the procedure were explained to the patient and he was agreeable to proceed.  Surgery was tentatively scheduled for Tuesday 1/28 with Dr. Cliffton Asters.  On Monday 06/21/2019 the patient developed worsening chest pain with ST changes.  He was taken emergently to the cath lab where he was again found to have an acute occlusion of his RCA.  This was treated with PTCA and balloon angioplasty with restoration of flow.  He remained on Cangrelor drip overnight.  He was taken to the operating room on 05/21/2020.  He underwent CABG x 4 utilizing LIMA to LAD, SVG to OM 1, SVG to OM 2, and SVG to PDA.  He  also underwent endoscopic harvest of greater saphenous vein from his right leg.  He tolerated the procedure without difficulty and was taken to the SICU in stable condition.  The patient was extubated the afternoon of surgery.  During his stay in the SICU the patient did not require pressor support.  His chest tubes were removed without difficulty.  He was in NSR and felt to be medically stable for transfer to the progressive care unit on 05/23/2020.

## 2020-05-23 ENCOUNTER — Inpatient Hospital Stay (HOSPITAL_COMMUNITY): Payer: Self-pay

## 2020-05-23 LAB — BASIC METABOLIC PANEL
Anion gap: 11 (ref 5–15)
BUN: 12 mg/dL (ref 6–20)
CO2: 24 mmol/L (ref 22–32)
Calcium: 8.6 mg/dL — ABNORMAL LOW (ref 8.9–10.3)
Chloride: 96 mmol/L — ABNORMAL LOW (ref 98–111)
Creatinine, Ser: 0.71 mg/dL (ref 0.61–1.24)
GFR, Estimated: >60 mL/min (ref >60)
Glucose, Bld: 127 mg/dL — ABNORMAL HIGH (ref 70–99)
Potassium: 4.3 mmol/L (ref 3.5–5.1)
Sodium: 131 mmol/L — ABNORMAL LOW (ref 135–145)

## 2020-05-23 LAB — CBC
HCT: 30.4 % — ABNORMAL LOW (ref 39.0–52.0)
Hemoglobin: 10.2 g/dL — ABNORMAL LOW (ref 13.0–17.0)
MCH: 32.2 pg (ref 26.0–34.0)
MCHC: 33.6 g/dL (ref 30.0–36.0)
MCV: 95.9 fL (ref 80.0–100.0)
Platelets: 245 10*3/uL (ref 150–400)
RBC: 3.17 MIL/uL — ABNORMAL LOW (ref 4.22–5.81)
RDW: 13.1 % (ref 11.5–15.5)
WBC: 19.5 10*3/uL — ABNORMAL HIGH (ref 4.0–10.5)
nRBC: 0 % (ref 0.0–0.2)

## 2020-05-23 LAB — GLUCOSE, CAPILLARY
Glucose-Capillary: 117 mg/dL — ABNORMAL HIGH (ref 70–99)
Glucose-Capillary: 119 mg/dL — ABNORMAL HIGH (ref 70–99)
Glucose-Capillary: 117 mg/dL — ABNORMAL HIGH (ref 70–99)
Glucose-Capillary: 132 mg/dL — ABNORMAL HIGH (ref 70–99)

## 2020-05-23 MED ORDER — ~~LOC~~ CARDIAC SURGERY, PATIENT & FAMILY EDUCATION: Freq: Once | Status: AC

## 2020-05-23 MED ORDER — SODIUM CHLORIDE 0.9 % IV SOLN: 250.0000 mL | INTRAVENOUS | Status: DC | PRN

## 2020-05-23 MED ORDER — POTASSIUM CHLORIDE CRYS ER 20 MEQ PO TBCR
40.0000 meq | EXTENDED_RELEASE_TABLET | Freq: Every day | ORAL | Status: DC
  Administered 2020-05-23: 40 meq via ORAL
  Filled 2020-05-23: qty 2

## 2020-05-23 MED ORDER — SODIUM CHLORIDE 0.9% FLUSH: 3.0000 mL | INTRAVENOUS | Status: DC | PRN

## 2020-05-23 MED ORDER — SODIUM CHLORIDE 0.9% FLUSH
3.0000 mL | Freq: Two times a day (BID) | INTRAVENOUS | Status: DC
  Administered 2020-05-23 – 2020-05-26 (×5): 3 mL via INTRAVENOUS

## 2020-05-23 MED ORDER — LORAZEPAM 0.5 MG PO TABS
0.5000 mg | ORAL_TABLET | Freq: Two times a day (BID) | ORAL | Status: DC | PRN
  Administered 2020-05-23 – 2020-05-26 (×6): 0.5 mg via ORAL
  Filled 2020-05-23 (×7): qty 1

## 2020-05-23 MED ORDER — GUAIFENESIN-DM 100-10 MG/5ML PO SYRP
5.0000 mL | ORAL_SOLUTION | Freq: Four times a day (QID) | ORAL | Status: DC
  Administered 2020-05-23 – 2020-05-26 (×12): 5 mL via ORAL
  Filled 2020-05-23 (×12): qty 5

## 2020-05-23 MED ORDER — FUROSEMIDE 10 MG/ML IJ SOLN
40.0000 mg | Freq: Once | INTRAMUSCULAR | Status: AC
  Administered 2020-05-23: 40 mg via INTRAVENOUS
  Filled 2020-05-23: qty 4

## 2020-05-23 NOTE — Progress Notes (Signed)
[address]       [place] [address]             [phone]                 2 Days Post-Op Procedure(s) (LRB): CORONARY ARTERY BYPASS GRAFTING TIMES FOUR USING LEFT INTERNAL MAMMARY ARTERY  AND ENDOSCOPICALLY HARVESTED RIGHT GREATER SAPHANOUS VEIN. (N/A) TRANSESOPHAGEAL ECHOCARDIOGRAM (TEE) (N/A) ENDOVEIN HARVEST OF RIGHT GREATER SAPHENOUS VEIN (Right)   Events: No events Doing well _______________________________________________________________ Vitals: BP 97/66   Pulse 73   Temp 98.3 F (36.8 C)   Resp (!) 22   Ht 5\' 6"  (1.676 m)   Wt 94.2 kg   SpO2 95%   BMI 33.52 kg/m   - Neuro: Alert and oriented x 3.  Sensory intact.  Moving all extremities w/o difficulty.    - Cardiovascular: NSR on telemetry.  Regular rate and rhythm.  Dressing to chest intact.       - Pulm: Lungs diminished in bases.  Mediastinal and pleural chest tube present.  CT output 537 over last 24H. Currently, 96% on 4L n/c.    ABG    Component Value Date/Time   PHART 7.379 05/21/2020 1727   PCO2ART 27.2 (L) 05/21/2020 1727   PO2ART 102 05/21/2020 1727   HCO3 16.0 (L) 05/21/2020 1727   TCO2 17 (L) 05/21/2020 1727   ACIDBASEDEF 8.0 (H) 05/21/2020 1727   O2SAT 98.0 05/21/2020 1727    - Abd: Abd soft, non-distended, non-tender.  BS hypoactive. Foley present.   - Extremity: Moving extremities w/o difficulty.  No swelling present.    .Intake/Output      12/29 0701 12/30 0700 12/30 0701 12/31 0700   P.O. 640    I.V. (mL/kg) 32.3 (0.3)    Blood     IV Piggyback 300    Total Intake(mL/kg) 972.3 (10.3)    Urine (mL/kg/hr) 825 (0.4)    Blood     Chest Tube 120    Total Output 945    Net +27.3            _______________________________________________________________ Labs: CBC Latest Ref Rng & Units 05/23/2020 05/22/2020 05/22/2020  WBC 4.0 - 10.5 K/uL 19.5(H) 20.1(H) 18.7(H)  Hemoglobin 13.0 - 17.0 g/dL 10.2(L) 11.2(L) 10.9(L)  Hematocrit 39.0 - 52.0 % 30.4(L)  32.9(L) 33.2(L)  Platelets 150 - 400 K/uL 245 260 275   CMP Latest Ref Rng & Units 05/23/2020 05/22/2020 05/22/2020  Glucose 70 - 99 mg/dL 05/24/2020) 631(S) 970(Y)  BUN 6 - 20 mg/dL 12 13 11   Creatinine 0.61 - 1.24 mg/dL 637(C 5.88  Sodium 135 - 145 mmol/L 131(L) 133(L) 134(L)  Potassium 3.5 - 5.1 mmol/L 4.3 5.1 4.6  Chloride 98 - 111 mmol/L 96(L) 99 101  CO2 22 - 32 mmol/L 24 24 24   Calcium 8.9 - 10.3 mg/dL 5.02) 7.74) 8.3(L)  Total Protein 6.5 - 8.1 g/dL - - -  Total Bilirubin 0.3 - 1.2 mg/dL - - -  Alkaline Phos 38 - 126 U/L - - -  AST 15 - 41 U/L - - -  ALT 0 - 44 U/L - - -    CXR: Left lower lobe atelectasis.    _______________________________________________________________  Assessment and Plan: POD #2  s/p CABG x 4 (LIMA-LAD, SVG-OM1, SVG-OM2, SVG-PD)  Neuro: Alert and oriented x 4.  Continue with tylenol Q6H; other pain meds PRN.  Add lidoderm patch to chest for pain control.   CV:  NSR on tele and this AM EKG.  EF 35-40%. LDL 64.  Hemodynamics stable and off pressors.  Continue with metoprolol 25 BID, ASA 325 mg, lipitor 80 mg.    Pulm:will remove CT today    Renal: stable   GI: Tolerating clears w/ flatus.  Advance diet as tolerates to heart healthy.  Continue bowel regimen w/ pain medications.   Heme:stable  ID: afebrile  Continue to monitor.   Endo: A1c 5.8%.  Glucose this AM 133.  SSI.   Dispo: floor today      Corliss Skains

## 2020-05-23 NOTE — Progress Notes (Signed)
RT instructed patient on the use of the incentive spirometer and flutter valve. Patient able to demonstrate back good technique with both devices and able to reach 500 mL with the incentive spirometer.

## 2020-05-23 NOTE — Progress Notes (Signed)
CARDIAC REHAB PHASE I   PRE:  Rate/Rhythm: 81 SR  BP:  Supine:   Sitting: 114/76  Standing:    SaO2: 92% 3L  MODE:  Ambulation: 290 ft   POST:  Rate/Rhythm: 84 SR  BP:  Supine: 121/77  Sitting:   Standing:    SaO2: 96% 3L 1320-1420 Pt walked 290 ft on 3L with EVA and asst x 1. Checked sats halfway and at 96%. Pt assisted back to bed as he wanted to sleep. Gave pt walking instructions for ex. Discussed CRP 2 and referred to [facility]. Encouraged IS and flutter valve. Gave smoking cessation handout and heart healthy diet. Pt stated he has quit smoking now. Discussed sternal precautions and staying in the tube.   Luetta Nutting, RN BSN  05/23/2020 2:16 PM

## 2020-05-23 NOTE — Discharge Summary (Signed)
[address]       [place] [address]             [phone]    Physician Discharge Summary  Patient ID: Matthew Tapia MRN: [id number] DOB/AGE: 11-12-1965 54 y.o.  Admit date: 05/17/2020 Discharge date: 05/26/2020  Admission Diagnoses:  Patient Active Problem List   Diagnosis Date Noted   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   CAD (coronary artery disease) 05/17/2020   HFrEF (heart failure with reduced ejection fraction) (HCC)    CAD, 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   Polysubstance abuse (HCC) 05/16/2020   Cardiomyopathy, ischemic 05/16/2020   Hyperlipidemia LDL goal <70 05/16/2020   Essential hypertension 05/16/2020   Elevated hemoglobin A1c 05/16/2020   Acute ST elevation myocardial infarction (STEMI) of inferior wall (HCC) 05/14/2020   Tobacco abuse 05/14/2020   Leukocytosis 05/14/2020   Anxiety 05/14/2020   Cough 05/14/2020     Discharge Diagnoses:  Patient Active Problem List   Diagnosis Date Noted   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   CAD (coronary artery disease) 05/17/2020   HFrEF (heart failure with reduced ejection fraction) (HCC)    CAD, 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   Polysubstance abuse (HCC) 05/16/2020   Cardiomyopathy, ischemic 05/16/2020   Hyperlipidemia LDL goal <70 05/16/2020   Essential hypertension 05/16/2020   Elevated hemoglobin A1c 05/16/2020   Acute ST elevation myocardial infarction (STEMI) of inferior wall (HCC) 05/14/2020   Tobacco abuse 05/14/2020   Leukocytosis 05/14/2020   Anxiety 05/14/2020   Cough 05/14/2020   Discharged Condition: stable  History of Present Illness:  Mr. Lindblad is a 54 yo male with history of substance abuse by self medicating with cocaine and amphetamines for anxiety and chronic pain, hypertension, dyslipidemia, and nicotine abuse.  He presented to [facility] ED with complaints of chest pain and associated shortness of breath.  These symptoms had been occurring for at least 2 weeks, but had gotten worse over the past  several days.  Workup in the ED consisted of EKG which showed Q waves and inferior ST segment elevations.  His Troponin level was also positive.  He was treated with ASA and started on Heparin infusion.  He was taken to the catheterization lab urgently on 05/14/2020.  He was found to have multivessel CAD with thrombus in the RCA.  This was cleared with aspiration thrombectomy.  He was started on Aggrastat and Brilinta for this.  The patient was admitted to the hospital for further care.  He was taken back to the catheterization lab on 05/16/2020 which showed restoration of flow to the RCA and no further intervention was required.  It was felt the patient would require coronary bypass grafting for treatment of his CAD.  He was transferred to [facility] for further care.   Hospital Course:    The patient was evaluated by Dr. Laneta Simmers who felt he would best be treated with coronary bypass procedure.  The risks and benefits of the procedure were explained to the patient and he was agreeable to proceed.  Surgery was tentatively scheduled for Tuesday 1/28 with Dr. Cliffton Asters.  On Monday 06/21/2019 the patient developed worsening chest pain with ST changes.  He was taken emergently to the cath lab where he was again found to have an acute occlusion of his RCA.  This was treated with PTCA and balloon angioplasty with restoration of flow.  He remained on Cangrelor drip overnight.  He was taken to the operating room on 05/21/2020.  He underwent CABG x 4 utilizing LIMA to LAD, SVG to OM 1, SVG to OM 2, and SVG to PDA.  He also underwent endoscopic harvest of greater saphenous vein from his right leg.  He tolerated the procedure without difficulty and was taken to the SICU in stable condition.  The patient was extubated the afternoon of surgery.  During his stay in the SICU the patient did not require pressor support.  His chest tubes were removed without difficulty.  He was in NSR and felt to be medically stable for  transfer to the progressive care unit on 05/23/2020. He has remained in sinus rhythm with PVCs. He is still mildly volume overloaded and requires further diuresis. He has been tolerating a diet and had a bowel movement on 12/31. He likely has pre diabetes as his pre op HGA1C is 5.8. He was provided with nutrition information in his discharge paper work and will need further surveillance by his primary medical doctor.  His sternal and RLE wounds are clean, dry, and healing without signs of infection. Epicardial pacing wires were removed on 12/30. He is felt surgically stable for discharge today.  Consults: None  Significant Diagnostic Studies: angiography: 12/21  There is moderate left ventricular systolic dysfunction. LV end diastolic pressure is severely elevated. Prox RCA to Dist RCA lesion is 100% stenosed. Post intervention, there is a 80% residual stenosis. Balloon angioplasty was performed using a BALLOON TREK RX 2.5X20. Mid Cx to Dist Cx lesion is 95% stenosed. 2nd Mrg lesion is 80% stenosed. Prox LAD to Mid LAD lesion is 30% stenosed. Mid LAD to Dist LAD lesion is 40% stenosed.   1.  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  In addition, there is high-grade stenosis in the mid left circumflex at the bifurcation of OM 2.  The LAD has mild nonobstructive disease. 2.  Moderately reduced LV systolic function with an EF of 30 to 35% and severely elevated left ventricular end-diastolic pressure. 3.  Aspiration thrombectomy and balloon angioplasty was done to the right coronary artery with partial success.  I was able to establish TIMI II flow but not able to clear all the thrombus.  12/23  Prox LAD to Mid LAD lesion is 70% stenosed. Mid LAD to Dist LAD lesion is 40% stenosed. Mid Cx to Dist Cx lesion is 95%  stenosed. 2nd Mrg lesion is 80% stenosed. Prox RCA to Dist RCA lesion is 80% stenosed. 1st Diag lesion is 99% stenosed.   1.  Severe three-vessel coronary artery disease.  Thrombus burden in the right coronary artery improved significantly in the vessel has normal TIMI-3 flow but has diffuse disease from the proximal to the distal segment before the bifurcation.  In addition, the patient seems to have significant proximal to mid LAD disease which was highly significant by fractional flow reserve evaluation with an IFR ratio of 0.76.  There is complex mid left circumflex disease at the bifurcation of OM 2.  There is also subtotal occlusion of first diagonal which seems to be diffusely diseased.   2.  Left ventricular angiography was not performed.  EF was 35 to 40% by echo. 3.  Significant improvement in left ventricular end-diastolic pressure which is currently only mildly elevated at 17 mmHg.   12/27  Mid LAD to Dist LAD lesion is 40% stenosed. 1st Diag lesion is 99%  stenosed. Prox LAD to Mid LAD lesion is 70% stenosed. Mid Cx to Dist Cx lesion is 95% stenosed. 2nd Mrg lesion is 80% stenosed. Prox RCA to Dist RCA lesion is 80% stenosed. Prox RCA lesion is 100% stenosed. Balloon angioplasty was performed using a BALLOON EMERGE MR 2.5X15. Post intervention, there is a 80% residual stenosis.   1. Acute occlusion of the proximal RCA 2. Successful PTCA/ balloon angioplasty of the proximal RCA 3. No change in severe disease in the LAD and circumflex   Recommendations: Will continue ASA, statin and beta blocker. While awaiting bypass, will transition from Aggrastat to Cangrelor drip due to hospital shortage of Aggrastat. CT surgery notified of change in clinical situation. I think he is stable to wait for bypass tomorrow. Will keep NPO for now.   Treatments:   CORONARY ARTERY BYPASS GRAFTING x 4 LIMA-LAD SVG-OM1 SVG-OM2 SVG-PD   SAPHANOUS VEIN. (N/A) - flow track TRANSESOPHAGEAL  ECHOCARDIOGRAM (TEE) (N/A) ENDOVEIN HARVEST OF RIGHT GREATER SAPHENOUS VEIN (Right)   Discharge Exam: Blood pressure 113/79, pulse 86, temperature 98.7 F (37.1 C), temperature source Oral, resp. rate 19, height  (1.676 m), weight 204 lb 1.6 oz (92.6 kg), SpO2 98 %. Cardiovascular: RRR Pulmonary: Clear to auscultation bilaterally Abdomen: Soft, non tender, bowel sounds present. Extremities: Mild bilateral lower extremity edema. Wounds: Sternal wound is clean and dry.  No erythema or signs of infection. RLE wound is clean and dry.   Discharge Medications:  The patient has been discharged on:   1.Beta Blocker:  Yes [  x ]                              No   [   ]                              If No, reason:  2.Ace Inhibitor/ARB: Yes [   ]                                     No  [  x  ]                                     If No, reason: Labile BP;may be able to start after discharge  3.Statin:   Yes [ x  ]                  No  [   ]                  If No, reason:  4.Ecasa:  Yes  [ x  ]                  No   [   ]                  If No, reason:    Discharge Instructions     Amb Referral to Cardiac Rehabilitation   Complete by: As directed    Diagnosis:  CABG STEMI PTCA     CABG X ___: 4   After initial evaluation and assessments completed: Virtual Based Care may be provided alone or in conjunction with Phase 2 Cardiac Rehab based on  patient barriers.: Yes      Allergies as of 05/26/2020   No Known Allergies      Medication List     STOP taking these medications    amoxicillin-clavulanate 875-125 MG tablet Commonly known as: AUGMENTIN       TAKE these medications    acetaminophen 325 MG tablet Commonly known as: TYLENOL Take 650-975 mg by mouth every 6 (six) hours as needed for mild pain, fever or headache.   aspirin 325 MG EC tablet Take 1 tablet (325 mg total) by mouth daily.   atorvastatin 80 MG tablet Commonly known as: LIPITOR Take 1  tablet (80 mg total) by mouth daily.   furosemide 40 MG tablet Commonly known as: LASIX Take 1 tablet (40 mg total) by mouth daily. For 5 days then stop   guaiFENesin-dextromethorphan 100-10 MG/5ML syrup Commonly known as: ROBITUSSIN DM Take 5 mLs by mouth every 4 (four) hours as needed for cough.   LORazepam 0.5 MG tablet Commonly known as: Ativan Take 1 tablet (0.5 mg total) by mouth every 8 (eight) hours as needed for anxiety.   metoprolol tartrate 25 MG tablet Commonly known as: LOPRESSOR Take 1 tablet (25 mg total) by mouth 2 (two) times daily.   oxyCODONE 5 MG immediate release tablet Commonly known as: Oxy IR/ROXICODONE Take 1 tablet (5 mg total) by mouth every 4 (four) hours as needed for severe pain.   potassium chloride SA 20 MEQ tablet Commonly known as: KLOR-CON Take 1 tablet (20 mEq total) by mouth daily. For 5 days then stop.        Follow-up Information     Corliss Skains, MD Follow up on 06/14/2020.   Specialty: Cardiothoracic Surgery Why: Appointment is at 11:15 Contact information: [address] [place] [address] [phone]         Lennon Alstrom, PA-C. Go on 06/05/2020.   Specialties: Physician Assistant, Cardiology Why: Appointment time is at 9:30 am Contact information: [address] [place] [address] [phone]         [facility] [place]. Go on 05/30/2020.   Specialty: Cardiothoracic Surgery Why: Appointment is with nurse only for chest tube suture removal. Appointment time is at 10:30 am Contact information: [address] [place], [address] [place] [address] [phone]                Signed: Doree Fudge PA-C 05/27/2020, 9:12 AM

## 2020-05-23 NOTE — Progress Notes (Deleted)
[address]       [place] [address]             [phone]                 2 Days Post-Op Procedure(s) (LRB): CORONARY ARTERY BYPASS GRAFTING TIMES FOUR USING LEFT INTERNAL MAMMARY ARTERY  AND ENDOSCOPICALLY HARVESTED RIGHT GREATER SAPHANOUS VEIN. (N/A) TRANSESOPHAGEAL ECHOCARDIOGRAM (TEE) (N/A) ENDOVEIN HARVEST OF RIGHT GREATER SAPHENOUS VEIN (Right)   Events: No overnight events.  Mr. Mcfann sitting up in chair.  Reports decrease pain but has pain with coughing.  Stopped smoking prior to surgery.  Denies shortness of breath.  Tolerating PO intake.   _______________________________________________________________ Vitals: BP 108/69   Pulse 88   Temp 98.3 F (36.8 C)   Resp (!) 26   Ht 5\' 6"  (1.676 m)   Wt 94.2 kg   SpO2 92%   BMI 33.52 kg/m   - Neuro: Alert and oriented x 3.     - Cardiovascular: Heart regular rate & rhythm.  NSR with rates 90s on telemetry. BP stable. + 11 lbs since pre-op.        - Pulm: Regular respirations. Clear productive cough with clear breath sounds after coughing.  Still on 3L n/c. CXR shows small left apical PTX with atelectasis. CT output 120.    - Abd: Abd soft, non-tender, non-distended.    - Extremity: Moving all extremities w/o difficulty.  Swelling noted to lower extremity.    .Intake/Output      12/29 0701 12/30 0700 12/30 0701 12/31 0700   P.O. 640    I.V. (mL/kg) 32.3 (0.3)    Blood     IV Piggyback 300    Total Intake(mL/kg) 972.3 (10.3)    Urine (mL/kg/hr) 825 (0.4)    Blood     Chest Tube 120    Total Output 945    Net +27.3            _______________________________________________________________ Labs: CBC Latest Ref Rng & Units 05/23/2020 05/22/2020 05/22/2020  WBC 4.0 - 10.5 K/uL 19.5(H) 20.1(H) 18.7(H)  Hemoglobin 13.0 - 17.0 g/dL 10.2(L) 11.2(L) 10.9(L)  Hematocrit 39.0 - 52.0 % 30.4(L) 32.9(L) 33.2(L)  Platelets 150 - 400 K/uL 245 260 275   CMP Latest Ref Rng & Units 05/23/2020  05/22/2020 05/22/2020  Glucose 70 - 99 mg/dL 05/24/2020) 035(D) 974(B)  BUN 6 - 20 mg/dL 12 13 11   Creatinine 0.61 - 1.24 mg/dL 638(G 5.36  Sodium 135 - 145 mmol/L 131(L) 133(L) 134(L)  Potassium 3.5 - 5.1 mmol/L 4.3 5.1 4.6  Chloride 98 - 111 mmol/L 96(L) 99 101  CO2 22 - 32 mmol/L 24 24 24   Calcium 8.9 - 10.3 mg/dL 4.68) 0.32) 8.3(L)  Total Protein 6.5 - 8.1 g/dL - - -  Total Bilirubin 0.3 - 1.2 mg/dL - - -  Alkaline Phos 38 - 126 U/L - - -  AST 15 - 41 U/L - - -  ALT 0 - 44 U/L - - -    CXR: Small left apical pneumothorax w/ atelectasis.    _______________________________________________________________  Assessment and Plan: POD #2  s/p CABG x 4  Neuro: Continue lidoderm and tylenol for pain with PRN pain medication as needed.  Continue bowel regimen w/ pain meds.   CV: Continue ASA, atorvastatin, lopressor.  Will give one dose of IV lasix today w/ KCl- supplement.  Pulm: Still on 3L n/c.  Continue to titrate off oxygen.  Change cough medication from PRN to schedule.  Added flutter valve.  CXR shows small left apical PTX with atelectasis. CT output 120.  Remove chest tube. Repeat CXR tomorrow.  Renal: BUN/Cr stable. Will give a dose of IV lasix today and likely start lasix 40 PO daily w/ KCl-.  Monitor I&Os with BMP tomorrow.   GI: Tolerating PO intake.  Continue bowel regimen with pain medications.   Heme: Hgb/Plt stable.  On lovenox for VTE PPX.    ID: Afebrile, WBC 19.5 likely reactive.  Continue to monitor for now.    Endo: A1c 5.8%, glucose 127 this AM.  Continue SSI for now.    Dispo: Txf to 4E.   Matt Holmes, NP 05/23/2020 9:53 AM

## 2020-05-23 NOTE — Progress Notes (Signed)
Arrived to pt room. Patient sitting at edge of bed. Nurse requested VAST to return latter and she would re-consult at that time. Matthew Morrow, RN VAST

## 2020-05-24 ENCOUNTER — Inpatient Hospital Stay (HOSPITAL_COMMUNITY): Payer: Self-pay

## 2020-05-24 LAB — TYPE AND SCREEN
ABO/RH(D): A POS
Antibody Screen: NEGATIVE
Unit division: 0
Unit division: 0

## 2020-05-24 LAB — BPAM RBC
Blood Product Expiration Date: 202201162359
Blood Product Expiration Date: 202201162359
ISSUE DATE / TIME: 202112231104
ISSUE DATE / TIME: 202112231104
Unit Type and Rh: 6200
Unit Type and Rh: 6200

## 2020-05-24 LAB — CBC
HCT: 29.2 % — ABNORMAL LOW (ref 39.0–52.0)
Hemoglobin: 9.5 g/dL — ABNORMAL LOW (ref 13.0–17.0)
MCH: 31.3 pg (ref 26.0–34.0)
MCHC: 32.5 g/dL (ref 30.0–36.0)
MCV: 96.1 fL (ref 80.0–100.0)
Platelets: 248 10*3/uL (ref 150–400)
RBC: 3.04 MIL/uL — ABNORMAL LOW (ref 4.22–5.81)
RDW: 12.9 % (ref 11.5–15.5)
WBC: 16.9 10*3/uL — ABNORMAL HIGH (ref 4.0–10.5)
nRBC: 0 % (ref 0.0–0.2)

## 2020-05-24 LAB — BASIC METABOLIC PANEL
Anion gap: 8 (ref 5–15)
BUN: 13 mg/dL (ref 6–20)
CO2: 25 mmol/L (ref 22–32)
Calcium: 8.1 mg/dL — ABNORMAL LOW (ref 8.9–10.3)
Chloride: 98 mmol/L (ref 98–111)
Creatinine, Ser: 0.71 mg/dL (ref 0.61–1.24)
GFR, Estimated: >60 mL/min (ref >60)
Glucose, Bld: 116 mg/dL — ABNORMAL HIGH (ref 70–99)
Potassium: 3.6 mmol/L (ref 3.5–5.1)
Sodium: 131 mmol/L — ABNORMAL LOW (ref 135–145)

## 2020-05-24 LAB — GLUCOSE, CAPILLARY
Glucose-Capillary: 124 mg/dL — ABNORMAL HIGH (ref 70–99)
Glucose-Capillary: 175 mg/dL — ABNORMAL HIGH (ref 70–99)
Glucose-Capillary: 150 mg/dL — ABNORMAL HIGH (ref 70–99)

## 2020-05-24 MED ORDER — LEVALBUTEROL HCL 0.63 MG/3ML IN NEBU
0.6300 mg | INHALATION_SOLUTION | Freq: Three times a day (TID) | RESPIRATORY_TRACT | Status: DC | PRN
  Administered 2020-05-24: 0.63 mg via RESPIRATORY_TRACT
  Filled 2020-05-24 (×2): qty 3

## 2020-05-24 MED ORDER — POTASSIUM CHLORIDE CRYS ER 20 MEQ PO TBCR
30.0000 meq | EXTENDED_RELEASE_TABLET | Freq: Two times a day (BID) | ORAL | Status: AC
  Administered 2020-05-24 (×2): 30 meq via ORAL
  Filled 2020-05-24 (×2): qty 1

## 2020-05-24 MED ORDER — POTASSIUM CHLORIDE CRYS ER 20 MEQ PO TBCR
20.0000 meq | EXTENDED_RELEASE_TABLET | Freq: Every day | ORAL | Status: DC
  Administered 2020-05-25 – 2020-05-26 (×2): 20 meq via ORAL
  Filled 2020-05-24 (×2): qty 1

## 2020-05-24 MED ORDER — LACTULOSE 10 GM/15ML PO SOLN
20.0000 g | Freq: Once | ORAL | Status: DC
  Filled 2020-05-24: qty 30

## 2020-05-24 MED ORDER — FUROSEMIDE 40 MG PO TABS
40.0000 mg | ORAL_TABLET | Freq: Every day | ORAL | Status: DC
  Administered 2020-05-24 – 2020-05-26 (×3): 40 mg via ORAL
  Filled 2020-05-24 (×3): qty 1

## 2020-05-24 MED ORDER — LEVALBUTEROL HCL 0.63 MG/3ML IN NEBU
0.6300 mg | INHALATION_SOLUTION | Freq: Once | RESPIRATORY_TRACT | Status: AC
  Administered 2020-05-24: 0.63 mg via RESPIRATORY_TRACT
  Filled 2020-05-24: qty 3

## 2020-05-24 NOTE — Discharge Instructions (Signed)
Discharge Instructions:  PLEASE AVOID ALL ILLICIT DRUG USE  1. You may shower, please wash incisions daily with soap and water and keep dry.  If you wish to cover wounds with dressing you may do so but please keep clean and change daily.  No tub baths or swimming until incisions have completely healed.  If your incisions become red or develop any drainage please call our office at [phone]  2. No Driving until cleared by Dr. Lucilla Lame office and you are no longer using narcotic pain medications  3. Monitor your weight daily.. Please use the same scale and weigh at same time... If you gain 5-10 lbs in 48 hours with associated lower extremity swelling, please contact our office at [phone]  4. Fever of 101.5 for at least 24 hours with no source, please contact our office at [phone]  5. Activity- up as tolerated, please walk at least 3 times per day.  Avoid strenuous activity, no lifting, pushing, or pulling with your arms over 8-10 lbs for a minimum of 6 weeks  6. If any questions or concerns arise, please do not hesitate to contact our office at [phone]   Prediabetes Eating Plan Prediabetes is a condition that causes blood sugar (glucose) levels to be higher than normal. This increases the risk for developing diabetes. In order to prevent diabetes from developing, your health care provider may recommend a diet and other lifestyle changes to help you:  Control your blood glucose levels.  Improve your cholesterol levels.  Manage your blood pressure. Your health care provider may recommend working with a diet and nutrition specialist (dietitian) to make a meal plan that is best for you. What are tips for following this plan? Lifestyle  Set weight loss goals with the help of your health care team. It is recommended that most people with prediabetes lose 7% of their current body weight.  Exercise for at least 30 minutes at least 5 days a week.  Attend a support group or  seek ongoing support from a mental health counselor.  Take over-the-counter and prescription medicines only as told by your health care provider. Reading food labels  Read food labels to check the amount of fat, salt (sodium), and sugar in prepackaged foods. Avoid foods that have: ? Saturated fats. ? Trans fats. ? Added sugars.  Avoid foods that have more than 300 milligrams (mg) of sodium per serving. Limit your daily sodium intake to less than 2,300 mg each day. Shopping  Avoid buying pre-made and processed foods. Cooking  Cook with olive oil. Do not use butter, lard, or ghee.  Bake, broil, grill, or boil foods. Avoid frying. Meal planning   Work with your dietitian to develop an eating plan that is right for you. This may include: ? Tracking how many calories you take in. Use a food diary, notebook, or mobile application to track what you eat at each meal. ? Using the glycemic index (GI) to plan your meals. The index tells you how quickly a food will raise your blood glucose. Choose low-GI foods. These foods take a longer time to raise blood glucose.  Consider following a Mediterranean diet. This diet includes: ? Several servings each day of fresh fruits and vegetables. ? Eating fish at least twice a week. ? Several servings each day of whole grains, beans, nuts, and seeds. ? Using olive oil instead of other fats. ? Moderate alcohol consumption. ? Eating small amounts of red meat and whole-fat dairy.  If you have  high blood pressure, you may need to limit your sodium intake or follow a diet such as the DASH eating plan. DASH is an eating plan that aims to lower high blood pressure. What foods are recommended? The items listed below may not be a complete list. Talk with your dietitian about what dietary choices are best for you. Grains Whole grains, such as whole-wheat or whole-grain breads, crackers, cereals, and pasta. Unsweetened oatmeal. Bulgur. Barley. Quinoa. Brown rice.  Corn or whole-wheat flour tortillas or taco shells. Vegetables Lettuce. Spinach. Peas. Beets. Cauliflower. Cabbage. Broccoli. Carrots. Tomatoes. Squash. Eggplant. Herbs. Peppers. Onions. Cucumbers. Brussels sprouts. Fruits Berries. Bananas. Apples. Oranges. Grapes. Papaya. Mango. Pomegranate. Kiwi. Grapefruit. Cherries. Meats and other protein foods Seafood. Poultry without skin. Lean cuts of pork and beef. Tofu. Eggs. Nuts. Beans. Dairy Low-fat or fat-free dairy products, such as yogurt, cottage cheese, and cheese. Beverages Water. Tea. Coffee. Sugar-free or diet soda. Seltzer water. Lowfat or no-fat milk. Milk alternatives, such as soy or almond milk. Fats and oils Olive oil. Canola oil. Sunflower oil. Grapeseed oil. Avocado. Walnuts. Sweets and desserts Sugar-free or low-fat pudding. Sugar-free or low-fat ice cream and other frozen treats. Seasoning and other foods Herbs. Sodium-free spices. Mustard. Relish. Low-fat, low-sugar ketchup. Low-fat, low-sugar barbecue sauce. Low-fat or fat-free mayonnaise. What foods are not recommended? The items listed below may not be a complete list. Talk with your dietitian about what dietary choices are best for you. Grains Refined white flour and flour products, such as bread, pasta, snack foods, and cereals. Vegetables Canned vegetables. Frozen vegetables with butter or cream sauce. Fruits Fruits canned with syrup. Meats and other protein foods Fatty cuts of meat. Poultry with skin. Breaded or fried meat. Processed meats. Dairy Full-fat yogurt, cheese, or milk. Beverages Sweetened drinks, such as sweet iced tea and soda. Fats and oils Butter. Lard. Ghee. Sweets and desserts Baked goods, such as cake, cupcakes, pastries, cookies, and cheesecake. Seasoning and other foods Spice mixes with added salt. Ketchup. Barbecue sauce. Mayonnaise. Summary  To prevent diabetes from developing, you may need to make diet and other lifestyle changes to  help control blood sugar, improve cholesterol levels, and manage your blood pressure.  Set weight loss goals with the help of your health care team. It is recommended that most people with prediabetes lose 7 percent of their current body weight.  Consider following a Mediterranean diet that includes plenty of fresh fruits and vegetables, whole grains, beans, nuts, seeds, fish, lean meat, low-fat dairy, and healthy oils. This information is not intended to replace advice given to you by your health care provider. Make sure you discuss any questions you have with your health care provider. Document Revised: 09/02/2018 Document Reviewed: 07/15/2016 Elsevier Patient Education  2020 [employer].

## 2020-05-24 NOTE — Progress Notes (Addendum)
      [address]       [place] [address]             [phone]        3 Days Post-Op Procedure(s) (LRB): CORONARY ARTERY BYPASS GRAFTING TIMES FOUR USING LEFT INTERNAL MAMMARY ARTERY  AND ENDOSCOPICALLY HARVESTED RIGHT GREATER SAPHANOUS VEIN. (N/A) TRANSESOPHAGEAL ECHOCARDIOGRAM (TEE) (N/A) ENDOVEIN HARVEST OF RIGHT GREATER SAPHENOUS VEIN (Right)  Subjective: Patient with cough and able to expectorate.  Objective: Vital signs in last 24 hours: Temp:  [98 F (36.7 C)-99 F (37.2 C)] 98.9 F (37.2 C) (12/31 0302) Pulse Rate:  [72-88] 80 (12/31 0302) Cardiac Rhythm: Normal sinus rhythm (12/31 0330) Resp:  [16-43] 18 (12/31 0302) BP: (96-121)/(60-79) 108/72 (12/31 0302) SpO2:  [89 %-98 %] 98 % (12/31 0302) Weight:  [91.9 kg] 91.9 kg (12/31 0453)  Pre op weight 89 kg Current Weight  05/24/20 91.9 kg       Intake/Output from previous day: 12/30 0701 - 12/31 0700 In: 0  Out: 1575 [Urine:1575]   Physical Exam:  Cardiovascular: RRR Pulmonary: Clear to auscultation bilaterally Abdomen: Soft, non tender, bowel sounds present. Extremities: Mild bilateral lower extremity edema. Wounds: Aquacel removed and sternal wound is clean and dry.  No erythema or signs of infection. RLE wound is clean and dry.  Lab Results: CBC: Recent Labs    05/23/20 0458 05/24/20 0040  WBC 19.5* 16.9*  HGB 10.2* 9.5*  HCT 30.4* 29.2*  PLT 245 248   BMET:  Recent Labs    05/23/20 0458 05/24/20 0040  NA 131* 131*  K 4.3 3.6  CL 96* 98  CO2 24 25  GLUCOSE 127* 116*  BUN 12 13  CREATININE 0.71 0.71  CALCIUM 8.6* 8.1*    PT/INR:  Lab Results  Component Value Date   INR 1.3 (H) 05/21/2020   INR 1.0 05/20/2020   INR 1.0 05/14/2020   ABG:  INR: Will add last result for INR, ABG once components are confirmed Will add last 4 CBG results once components are confirmed  Assessment/Plan:  1. CV - SR with HR in the 70-80's. On Lopressor 25 mg bid. 2.   Pulmonary - On 2 liters of oxygen via [place]. Wean as able. CXR this am shows small left apical pneumothorax. Will check PA/LAT CXR in am. Robitussin DM PRN cough. Encourage incentive spirometer. 3. Volume Overload - Will give Lasix 4.  Expected post op acute blood loss anemia - H and H this am slightly decreased to 9.5 and 29.2 5. CBGs 117/132/124. Pre op HGA1C 5.8. He likely is borderline diabetic. Will provide nutrition information with discharge paperwork and he will need to follow up with medical doctor after discharge. Will stop accu checks and SS PRN 6. Hyponatremia-sodium remains 131. Likely related to diuresis 7. Supplement potassium 8. Remove EPW 9. Likely home 1-2 days  Donielle M ZimmermanPA-C 05/24/2020,7:12 AM   Agree with above. Overall doing well Multiple PVCs this morning. Replacing potassium, and will increase beta-blockade. Continue pulmonary toilet Dispel planning.  Abagael Kramm Keane Scrape

## 2020-05-24 NOTE — Progress Notes (Signed)
Mobility Specialist: Progress Note   05/24/20 1239  Mobility  Activity Ambulated in room  Level of Assistance Modified independent, requires aide device or extra time  Assistive Device Other (Comment) (EVA)  Mobility Response Tolerated well  Mobility performed by Mobility specialist  $Mobility charge 1 Mobility   Pre-Mobility on 1 L/min [place]: 75 HR, 93% SpO2 Post-Mobility:           On 1 L/min : 76 HR, 100% SpO2           On RA: 93-97%  Pt on BSC upon entering room. Pt ambulated one lap in room and stated he needed to go back to the [facility]. Pt still on BSC. Encouraged pt to continue ambulation with staff.   [facility] Ivyonna Hoelzel Mobility Specialist

## 2020-05-24 NOTE — Progress Notes (Addendum)
Checked with pt earlier in am and needed time. When I returned, pt had just ambulated with RN (EVA and 2L). C/o being uncomfortable in recliner with significant anxiety. Had pt practice IS (500 mL) and reinforced need for x3 walks a day. Reviewed education for home. Eventually pt felt need for BM therefore pt tx to [facility] with good mechanics. Left pt on RA as SaO2 98 1L on BSC. [id number] Ethelda Chick CES, ACSM 12:49 PM 05/24/2020

## 2020-05-25 ENCOUNTER — Other Ambulatory Visit: Payer: Self-pay | Admitting: Physician Assistant

## 2020-05-25 ENCOUNTER — Inpatient Hospital Stay (HOSPITAL_COMMUNITY): Payer: Self-pay

## 2020-05-25 MED ORDER — GUAIFENESIN-DM 100-10 MG/5ML PO SYRP: 5.0000 mL | ORAL_SOLUTION | ORAL | 0 refills | Status: DC | PRN

## 2020-05-25 MED ORDER — FUROSEMIDE 40 MG PO TABS: 40.0000 mg | ORAL_TABLET | Freq: Every day | ORAL | 0 refills | Status: DC

## 2020-05-25 MED ORDER — ASPIRIN 325 MG PO TBEC: 325.0000 mg | DELAYED_RELEASE_TABLET | Freq: Every day | ORAL | 0 refills | Status: DC

## 2020-05-25 MED ORDER — POTASSIUM CHLORIDE CRYS ER 20 MEQ PO TBCR: 20.0000 meq | EXTENDED_RELEASE_TABLET | Freq: Every day | ORAL | 0 refills | Status: DC

## 2020-05-25 MED ORDER — ATORVASTATIN CALCIUM 80 MG PO TABS: 80.0000 mg | ORAL_TABLET | Freq: Every day | ORAL | 1 refills | Status: DC

## 2020-05-25 NOTE — Progress Notes (Addendum)
      [address]       [place] [address]             [phone]        4 Days Post-Op Procedure(s) (LRB): CORONARY ARTERY BYPASS GRAFTING TIMES FOUR USING LEFT INTERNAL MAMMARY ARTERY  AND ENDOSCOPICALLY HARVESTED RIGHT GREATER SAPHANOUS VEIN. (N/A) TRANSESOPHAGEAL ECHOCARDIOGRAM (TEE) (N/A) ENDOVEIN HARVEST OF RIGHT GREATER SAPHENOUS VEIN (Right)  Subjective: Patient had a large bowel movement yesterday. He continues with cough and able to expectorate.  Objective: Vital signs in last 24 hours: Temp:  [98.4 F (36.9 C)-99.8 F (37.7 C)] 98.7 F (37.1 C) (01/01 0447) Pulse Rate:  [85-93] 86 (01/01 0447) Cardiac Rhythm: Normal sinus rhythm (12/31 1930) Resp:  [20-24] 20 (01/01 0447) BP: (100-117)/(62-77) 100/77 (01/01 0447) SpO2:  [95 %-100 %] 97 % (01/01 0447) FiO2 (%):  [28 %] 28 % (12/31 0850) Weight:  [92.4 kg] 92.4 kg (01/01 0451)  Pre op weight 89 kg Current Weight  05/25/20 92.4 kg       Intake/Output from previous day: 12/31 0701 - 01/01 0700 In: 1440 [P.O.:1440] Out: 1400 [Urine:1400]   Physical Exam:  Cardiovascular: RRR Pulmonary: Clear to auscultation bilaterally Abdomen: Soft, non tender, bowel sounds present. Extremities: Mild bilateral lower extremity edema. Wounds: Sternal wound is clean and dry.  No erythema or signs of infection. RLE wound is clean and dry.  Lab Results: CBC: Recent Labs    05/23/20 0458 05/24/20 0040  WBC 19.5* 16.9*  HGB 10.2* 9.5*  HCT 30.4* 29.2*  PLT 245 248   BMET:  Recent Labs    05/23/20 0458 05/24/20 0040  NA 131* 131*  K 4.3 3.6  CL 96* 98  CO2 24 25  GLUCOSE 127* 116*  BUN 12 13  CREATININE 0.71 0.71  CALCIUM 8.6* 8.1*    PT/INR:  Lab Results  Component Value Date   INR 1.3 (H) 05/21/2020   INR 1.0 05/20/2020   INR 1.0 05/14/2020   ABG:  INR: Will add last result for INR, ABG once components are confirmed Will add last 4 CBG results once components are  confirmed  Assessment/Plan:  1. CV - SR, PVCs with HR in the 90's. On Lopressor 25 mg bid. With SBP in the low 100's, will not titrate BB but may need to do so as outpatient 2.  Pulmonary - On room air but this am back on oxygen;per patient makes him feel comfortable but clinically does not need.  PA/LAT CXR this am appears stable (bibasilar atelectasis and small pleural effusions, cardiomegaly). Robitussin DM PRN cough. Encourage incentive spirometer. 3. Volume Overload - Continue oral Lasix 4.  Expected post op acute blood loss anemia - Last H and H slightly decreased to 9.5 and 29.2 5. Hyponatremia-sodium yesterday remained 131. Likely related to diuresis 6. As discussed with Dr. Cliffton Asters, likely discharge in am  Lelon Huh [facility] 05/25/2020,7:36 AM   Agree with above Will continue diuresis today Will watch PVCs today Likely discharge tomorrow or Monday  Corliss Skains

## 2020-05-25 NOTE — Progress Notes (Signed)
Mobility Specialist: Progress Note   05/25/20 1212  Mobility  Activity Ambulated in room  Level of Assistance Independent  Assistive Device Front wheel walker  Distance Ambulated (ft) 192 ft  Mobility Response Tolerated well  Mobility performed by Mobility specialist  Bed Position Chair  $Mobility charge 1 Mobility   Pre-Mobility: 77 HR, 112/76 BP, 96% SpO2 Post-Mobility: 85 HR, 112/64 BP, 100% SpO2  Pt says he can't wear a mask due to being claustrophobic so pt ambulated in the room. Pt said he needed to sit down due to coughing episode, RN notified. After coughing episode pt declined to continue ambulation due to CP. Pt positioned in chair with call bell.   [facility] Tauheedah Bok Mobility Specialist

## 2020-05-25 NOTE — Plan of Care (Signed)
Pt. Resting in bed this morning. States his pain level is 8/10. PRN oxycodone administered. Effective results noted. Pt has productive cough and encouragement provided regarding cough and deep breathing as well as use of incentive spirometer. Ambulates with minimal assist of 1 person. Brynda Rim, RN

## 2020-05-25 NOTE — Progress Notes (Addendum)
After talking with patient . Patient went down in bed w/ o2. To x-ray

## 2020-05-25 NOTE — Progress Notes (Signed)
Patient refusing to go down to x-ray because he cannot take his suction with him to spit in.

## 2020-05-26 ENCOUNTER — Other Ambulatory Visit: Payer: Self-pay | Admitting: Physician Assistant

## 2020-05-26 MED ORDER — FUROSEMIDE 40 MG PO TABS: 40.0000 mg | ORAL_TABLET | Freq: Every day | ORAL | 0 refills | Status: DC

## 2020-05-26 MED ORDER — OXYCODONE HCL 5 MG PO TABS: 5.0000 mg | ORAL_TABLET | ORAL | 0 refills | Status: DC | PRN

## 2020-05-26 MED ORDER — METOPROLOL TARTRATE 25 MG PO TABS: 25.0000 mg | ORAL_TABLET | Freq: Two times a day (BID) | ORAL | 1 refills | Status: DC

## 2020-05-26 MED ORDER — POTASSIUM CHLORIDE CRYS ER 20 MEQ PO TBCR: 20.0000 meq | EXTENDED_RELEASE_TABLET | Freq: Every day | ORAL | 0 refills | Status: DC

## 2020-05-26 MED ORDER — ATORVASTATIN CALCIUM 80 MG PO TABS: 80.0000 mg | ORAL_TABLET | Freq: Every day | ORAL | 1 refills | Status: DC

## 2020-05-26 NOTE — Plan of Care (Signed)
Pt. Will be d/c home today. Midsternal incision is well approximated, OTA, no s/s of infection. Patient is tolerating walking short distances and is continent and taking self to bethroom with supervision. Appetite is good. Pain controlled with PRN oxycodone q 3 hours. All vital sighs and stable and within normal limits. Brynda Rim, RN

## 2020-05-26 NOTE — Progress Notes (Addendum)
      [address]       [place] [address]             [phone]        5 Days Post-Op Procedure(s) (LRB): CORONARY ARTERY BYPASS GRAFTING TIMES FOUR USING LEFT INTERNAL MAMMARY ARTERY  AND ENDOSCOPICALLY HARVESTED RIGHT GREATER SAPHANOUS VEIN. (N/A) TRANSESOPHAGEAL ECHOCARDIOGRAM (TEE) (N/A) ENDOVEIN HARVEST OF RIGHT GREATER SAPHENOUS VEIN (Right)  Subjective: He continues with cough and able to expectorate. He really wants to go home.  Objective: Vital signs in last 24 hours: Temp:  [98.5 F (36.9 C)-99.4 F (37.4 C)] 98.8 F (37.1 C) (01/02 0501) Pulse Rate:  [83-93] 83 (01/02 0501) Cardiac Rhythm: Normal sinus rhythm (01/01 1900) Resp:  [16-20] 20 (01/02 0501) BP: (112-124)/(64-79) 120/72 (01/02 0501) SpO2:  [96 %-100 %] 100 % (01/02 0501) Weight:  [92.6 kg] 92.6 kg (01/02 0501)  Pre op weight 89 kg Current Weight  05/26/20 92.6 kg       Intake/Output from previous day: 01/01 0701 - 01/02 0700 In: 963 [P.O.:960; I.V.:3] Out: 875 [Urine:875]   Physical Exam:  Cardiovascular: RRR Pulmonary: Clear to auscultation bilaterally Abdomen: Soft, non tender, bowel sounds present. Extremities: Mild bilateral lower extremity edema. Bruising medial right ankle (from [facility]) Wounds: Sternal wound is clean and dry.  No erythema or signs of infection. RLE wound is clean and dry.  Lab Results: CBC: Recent Labs    05/24/20 0040  WBC 16.9*  HGB 9.5*  HCT 29.2*  PLT 248   BMET:  Recent Labs    05/24/20 0040  NA 131*  K 3.6  CL 98  CO2 25  GLUCOSE 116*  BUN 13  CREATININE 0.71  CALCIUM 8.1*    PT/INR:  Lab Results  Component Value Date   INR 1.3 (H) 05/21/2020   INR 1.0 05/20/2020   INR 1.0 05/14/2020   ABG:  INR: Will add last result for INR, ABG once components are confirmed Will add last 4 CBG results once components are confirmed  Assessment/Plan:  1. CV - SR, PVCs with HR in the 80's. On Lopressor 25 mg bid. SBP more in the  120's. Will discuss with Dr. Cliffton Asters if should titrate BB 2.  Pulmonary - On room air but this am back on oxygen;per patient makes him feel comfortable but clinically does not need.  PA/LAT CXR this am appears stable (bibasilar atelectasis and small pleural effusions, cardiomegaly). Robitussin DM PRN cough. Encourage incentive spirometer. 3. Volume Overload - Continue oral Lasix 4.  Expected post op acute blood loss anemia - Last H and H slightly decreased to 9.5 and 29.2 5. Hyponatremia-last sodium  remained 131. Likely related to diuresis 6. Will discuss disposition with Dr. Suzanna Obey M ZimmermanPA-C 05/26/2020,8:03 AM    Agree with above Ready for discharge, but will need to meet with family to discuss planning Likely home today  Corliss Skains

## 2020-05-26 NOTE — Progress Notes (Signed)
Patient complaint of poor pain management, requesting to speak with physician immediately. Advised patient he may speak with rounding physician in the morning - patient requesting IV morphine. Ambulated patient 700 ft on room air, returned to bed and medicated with ordered po pain med. Pain says he thinks he will be okay, Will continue to monitor.

## 2020-05-26 NOTE — Progress Notes (Signed)
Pt. Left facility accompanied by family member. All personal belongings packed and brought home. Discharge instructions to include follow up appointments, medication instructions, and aftercare instructions reviewed with patient. Pt verbalized understanding of all instructions. Brynda Rim, RN

## 2020-05-26 NOTE — Discharge Summary (Signed)
[address]       [place] [address]             [phone]    Physician Discharge Summary  Patient ID: Matthew Tapia MRN: [id number] DOB/AGE: [birth year] 55 y.o.  Admit date: 05/17/2020 Discharge date: 05/26/2020  Admission Diagnoses:  Patient Active Problem List   Diagnosis Date Noted  . 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 CAD (coronary artery disease) 05/17/2020  . HFrEF (heart failure with reduced ejection fraction) (HCC)   . CAD, 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  . Polysubstance abuse (HCC) 05/16/2020  . Cardiomyopathy, ischemic 05/16/2020  . Hyperlipidemia LDL goal <70 05/16/2020  . Essential hypertension 05/16/2020  . Elevated hemoglobin A1c 05/16/2020  . Acute ST elevation myocardial infarction (STEMI) of inferior wall (HCC) 05/14/2020  . Tobacco abuse 05/14/2020  . Leukocytosis 05/14/2020  . Anxiety 05/14/2020  . Cough 05/14/2020     Discharge Diagnoses:  Patient Active Problem List   Diagnosis Date Noted  . 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 CAD (coronary artery disease) 05/17/2020  . HFrEF (heart failure with reduced ejection fraction) (HCC)   . CAD, 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  . Polysubstance abuse (HCC) 05/16/2020  . Cardiomyopathy, ischemic 05/16/2020  . Hyperlipidemia LDL goal <70 05/16/2020  . Essential hypertension 05/16/2020  . Elevated hemoglobin A1c 05/16/2020  . Acute ST elevation myocardial infarction (STEMI) of inferior wall (HCC) 05/14/2020  . Tobacco abuse 05/14/2020  . Leukocytosis 05/14/2020  . Anxiety 05/14/2020  . Cough 05/14/2020   Discharged Condition: stable  History of Present Illness:  [name] is a 55 yo male with history of substance abuse by self medicating with cocaine and amphetamines for anxiety and chronic pain, hypertension, dyslipidemia, and nicotine abuse.  He presented to [facility] ED with complaints of chest pain and associated shortness of breath.  These symptoms had been occurring for at least 2 weeks, but had  gotten worse over the past several days.  Workup in the ED consisted of EKG which showed Q waves and inferior ST segment elevations.  His Troponin level was also positive.  He was treated with ASA and started on Heparin infusion.  He was taken to the catheterization lab urgently on 05/14/2020.  He was found to have multivessel CAD with thrombus in the RCA.  This was cleared with aspiration thrombectomy.  He was started on Aggrastat and Brilinta for this.  The patient was admitted to the hospital for further care.  He was taken back to the catheterization lab on 05/16/2020 which showed restoration of flow to the RCA and no further intervention was required.  It was felt the patient would require coronary bypass grafting for treatment of his CAD.  He was transferred to [facility] for further care.   Hospital Course:    The patient was evaluated by Dr. Laneta Simmers who felt he would best be treated with coronary bypass procedure.  The risks and benefits of the procedure were explained to the patient and he was agreeable to proceed.  Surgery was tentatively scheduled for Tuesday 1/28 with Dr. Cliffton Asters.  On Monday 06/21/2019 the patient developed worsening chest pain with ST changes.  He was taken emergently to the cath lab where he was again found to have an acute occlusion of his RCA.  This was treated with PTCA and balloon angioplasty with restoration of flow.  He remained on Cangrelor drip overnight.  He was taken to the operating room on 05/21/2020.  He underwent CABG x 4 utilizing LIMA to LAD, SVG to OM 1, SVG to OM 2, and SVG to PDA.  He also underwent endoscopic harvest of greater saphenous vein from his right leg.  He tolerated the procedure without difficulty and was taken to the SICU in stable condition.  The patient was extubated the afternoon of surgery.  During his stay in the SICU the patient did not require pressor support.  His chest tubes were removed without difficulty.  He was in NSR and felt  to be medically stable for transfer to the progressive care unit on 05/23/2020. He has remained in sinus rhythm with PVCs. We were unable to titrate his Lopressor secondary to SBP. He is still mildly volume overloaded and requires further diuresis. He has been tolerating a diet and had a bowel movement on 12/31. He likely has pre diabetes as his pre op HGA1C is 5.8. He was provided with nutrition information in his discharge paper work and will need further surveillance by his primary medical doctor.  His sternal and RLE wounds are clean, dry, and healing without signs of infection. Epicardial pacing wires were removed on 12/30. As discussed with Dr. Cliffton Asters, he is felt surgically stable for discharge today.  Consults: None  Significant Diagnostic Studies: angiography: 12/21   There is moderate left ventricular systolic dysfunction.  LV end diastolic pressure is severely elevated.  Prox RCA to Dist RCA lesion is 100% stenosed.  Post intervention, there is a 80% residual stenosis.  Balloon angioplasty was performed using a BALLOON TREK RX 2.5X20.  Mid Cx to Dist Cx lesion is 95% stenosed.  2nd Mrg lesion is 80% stenosed.  Prox LAD to Mid LAD lesion is 30% stenosed.  Mid LAD to Dist LAD lesion is 40% stenosed.   1.  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  In addition, there is high-grade stenosis in the mid left circumflex at the bifurcation of OM 2.  The LAD has mild nonobstructive disease. 2.  Moderately reduced LV systolic function with an EF of 30 to 35% and severely elevated left ventricular end-diastolic pressure. 3.  Aspiration thrombectomy and balloon angioplasty was done to the right coronary artery with partial success.  I was able to establish TIMI II flow but not able to clear all the  thrombus.  12/23   Prox LAD to Mid LAD lesion is 70% stenosed.  Mid LAD to Dist LAD lesion is 40% stenosed.  Mid Cx to Dist Cx lesion is 95% stenosed.  2nd Mrg lesion is 80% stenosed.  Prox RCA to Dist RCA lesion is 80% stenosed.  1st Diag lesion is 99% stenosed.   1.  Severe three-vessel coronary artery disease.  Thrombus burden in the right coronary artery improved significantly in the vessel has normal TIMI-3 flow but has diffuse disease from the proximal to the distal segment before the bifurcation.  In addition, the patient seems to have significant proximal to mid LAD disease which was highly significant by fractional flow reserve evaluation with an IFR ratio of 0.76.  There is complex mid left circumflex disease at the bifurcation of OM 2.  There is also subtotal occlusion of first diagonal which seems to be diffusely diseased.  2.  Left ventricular angiography was not performed.  EF was 35 to 40% by echo. 3.  Significant improvement in left ventricular  end-diastolic pressure which is currently only mildly elevated at 17 mmHg.  12/27   Mid LAD to Dist LAD lesion is 40% stenosed.  1st Diag lesion is 99% stenosed.  Prox LAD to Mid LAD lesion is 70% stenosed.  Mid Cx to Dist Cx lesion is 95% stenosed.  2nd Mrg lesion is 80% stenosed.  Prox RCA to Dist RCA lesion is 80% stenosed.  Prox RCA lesion is 100% stenosed.  Balloon angioplasty was performed using a BALLOON EMERGE MR 2.5X15.  Post intervention, there is a 80% residual stenosis.   1. Acute occlusion of the proximal RCA 2. Successful PTCA/ balloon angioplasty of the proximal RCA 3. No change in severe disease in the LAD and circumflex  Recommendations: Will continue ASA, statin and beta blocker. While awaiting bypass, will transition from Aggrastat to Cangrelor drip due to hospital shortage of Aggrastat. CT surgery notified of change in clinical situation. I think he is stable to wait for bypass tomorrow.  Will keep NPO for now.   Treatments:   CORONARY ARTERY BYPASS GRAFTING x 4 LIMA-LAD SVG-OM1 SVG-OM2 SVG-PD   SAPHANOUS VEIN. (N/A) - flow track TRANSESOPHAGEAL ECHOCARDIOGRAM (TEE) (N/A) ENDOVEIN HARVEST OF RIGHT GREATER SAPHENOUS VEIN (Right)   Discharge Exam: Blood pressure 102/72, pulse 84, temperature 98.6 F (37 C), temperature source Oral, resp. rate 20, height 5\' 6"  (1.676 m), weight 92.6 kg, SpO2 100 %. Cardiovascular: RRR Pulmonary: Clear to auscultation bilaterally Abdomen: Soft, non tender, bowel sounds present. Extremities: Mild bilateral lower extremity edema. Wounds: Sternal wound is clean and dry.  No erythema or signs of infection. RLE wound is clean and dry.   Discharge Medications:  The patient has been discharged on:   1.Beta Blocker:  Yes [  x ]                              No   [   ]                              If No, reason:  2.Ace Inhibitor/ARB: Yes [   ]                                     No  [  x  ]                                     If No, reason: Labile BP;may be able to start after discharge  3.Statin:   Yes [ x  ]                  No  [   ]                  If No, reason:  4.Ecasa:  Yes  [ x  ]                  No   [   ]                  If No, reason:    Discharge Instructions    Amb Referral to Cardiac Rehabilitation   Complete by: As directed    Diagnosis:  CABG STEMI PTCA  CABG X ___: 4   After initial evaluation and assessments completed: Virtual Based Care may be provided alone or in conjunction with Phase 2 Cardiac Rehab based on patient barriers.: Yes     Allergies as of 05/26/2020   No Known Allergies     Medication List    STOP taking these medications   amoxicillin-clavulanate 875-125 MG tablet Commonly known as: AUGMENTIN     TAKE these medications   acetaminophen 325 MG tablet Commonly known as: TYLENOL Take 650-975 mg by mouth every 6 (six) hours as needed for mild pain, fever or headache.    aspirin 325 MG EC tablet Take 1 tablet (325 mg total) by mouth daily.   atorvastatin 80 MG tablet Commonly known as: LIPITOR Take 1 tablet (80 mg total) by mouth daily.   furosemide 40 MG tablet Commonly known as: LASIX Take 1 tablet (40 mg total) by mouth daily. For 5 days then stop   guaiFENesin-dextromethorphan 100-10 MG/5ML syrup Commonly known as: ROBITUSSIN DM Take 5 mLs by mouth every 4 (four) hours as needed for cough.   LORazepam 0.5 MG tablet Commonly known as: Ativan Take 1 tablet (0.5 mg total) by mouth every 8 (eight) hours as needed for anxiety.   metoprolol tartrate 25 MG tablet Commonly known as: LOPRESSOR Take 1 tablet (25 mg total) by mouth 2 (two) times daily.   oxyCODONE 5 MG immediate release tablet Commonly known as: Oxy IR/ROXICODONE Take 1 tablet (5 mg total) by mouth every 4 (four) hours as needed for severe pain.   potassium chloride SA 20 MEQ tablet Commonly known as: KLOR-CON Take 1 tablet (20 mEq total) by mouth daily. For 5 days then stop.       Follow-up Information    Lajuana Matte, MD Follow up on 06/14/2020.   Specialty: Cardiothoracic Surgery Why: Appointment is at 11:15 Contact information: [address] [place] [address] [phone]        Arvil Chaco, PA-C. Go on 06/05/2020.   Specialties: Physician Assistant, Cardiology Why: Appointment time is at 9:30 am Contact information: [place] [address] [phone]        [facility] [place]. Go on 05/30/2020.   Specialty: Cardiothoracic Surgery Why: Appointment is with nurse only for chest tube suture removal. Appointment time is at 10:30 am Contact information: [address] [place], [place] [phone]              Signed: Lars Pinks PA-C 05/26/2020, 10:33 AM

## 2020-05-26 NOTE — TOC Initial Note (Addendum)
Transition of Care [facility]) - Initial/Assessment Note    Patient Details  Name: Matthew Tapia MRN: [id number] Date of Birth: 1966-02-27  Transition of Care [facility]) CM/SW Contact:    Gala Lewandowsky, RN Phone Number: 05/26/2020, 1:36 PM  Clinical Narrative: Case Manager received a call from the staff RN regarding plan of care. Daughter had called and had concerns regarding patient being discharged to his mothers home. Pt has BCBS that was just initiated and not on file. Patient has been ambulating with cardiac rehab and ambulating without issues. Daughter wanted the patient to transition to a skilled nursing facility; however patient is ambulating over 150 feet. Case Manager explained that BCBS would probably not pay for this service. Daughter wanted the MD or PA to give her a call and Case Manager was able to speak with PA regarding plan of care. PA states that the physician will call the daughter. PA asked Staff RN to ambulate patient again and document. Patient ambulated 325 feet. Patient feels that he did well and will not need facility or home health services. Daughter will assist patient outpatient for substance abuse rehab once he gets home. Pt is aware of co pay for [facility]ations at [facility]- pt states he will be able to afford. No further needs from Case Manager at this time.                  Expected Discharge Plan: Home/Self Care Barriers to Discharge: No Barriers Identified   Patient Goals and CMS Choice Patient states their goals for this hospitalization and ongoing recovery are:: to return home   Choice offered to / list presented to : NA  Expected Discharge Plan and Services Expected Discharge Plan: Home/Self Care In-house Referral: NA Discharge Planning Services: CM Consult   Living arrangements for the past 2 months: Single Family Home Expected Discharge Date: 05/26/20               DME Arranged: N/A DME Agency: NA       HH Arranged: NA          Prior  Living Arrangements/Services Living arrangements for the past 2 months: Single Family Home Lives with:: Parents (Pt lives with his mother- has support of is daughter.)   Do you feel safe going back to the place where you live?: Yes      Need for Family Participation in Patient Care: Yes (Comment) Care giver support system in place?: Yes (comment)   Criminal Activity/Legal Involvement Pertinent to Current Situation/Hospitalization: No - Comment as needed  Activities of Daily Living Home Assistive Devices/Equipment: None ADL Screening (condition at time of admission) Patient's cognitive ability adequate to safely complete daily activities?: Yes Is the patient deaf or have difficulty hearing?: No Does the patient have difficulty seeing, even when wearing glasses/contacts?: No Does the patient have difficulty concentrating, remembering, or making decisions?: No Patient able to express need for assistance with ADLs?: Yes Does the patient have difficulty dressing or bathing?: No Independently performs ADLs?: Yes (appropriate for developmental age) Does the patient have difficulty walking or climbing stairs?: No Weakness of Legs: None Weakness of Arms/Hands: None  Permission Sought/Granted Permission sought to share information with : Family Microbiologist    Emotional Assessment Appearance:: Appears stated age Attitude/Demeanor/Rapport: Engaged Affect (typically observed): Appropriate Orientation: : Oriented to Situation,Oriented to  Time,Oriented to Self,Oriented to Place Alcohol / Substance Use: Not Applicable Psych Involvement: No (comment)  Admission diagnosis:  CAD (coronary artery disease) [I25.10] S/P CABG  x 4 [Z95.1] Patient Active Problem List   Diagnosis Date Noted  . 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 CAD (coronary artery disease) 05/17/2020  . HFrEF (heart failure with reduced ejection fraction) (HCC)   . CAD, 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  . Polysubstance abuse (HCC)  05/16/2020  . Cardiomyopathy, ischemic 05/16/2020  . Hyperlipidemia LDL goal <70 05/16/2020  . Essential hypertension 05/16/2020  . Elevated hemoglobin A1c 05/16/2020  . Acute ST elevation myocardial infarction (STEMI) of inferior wall (HCC) 05/14/2020  . Tobacco abuse 05/14/2020  . Leukocytosis 05/14/2020  . Anxiety 05/14/2020  . Cough 05/14/2020   PCP:  Patient, No Pcp Per Pharmacy:   [id number] [place], [place] - [address] AT [facility] [address] [place] [address] Phone: [phone] Fax: [phone]  Readmission Risk Interventions Readmission Risk Prevention Plan 05/26/2020  Transportation Screening Complete  HRI or Home Care Consult Complete  Social Work Consult for Recovery Care Planning/Counseling Complete  Palliative Care Screening Not Applicable  Medication Review Oceanographer) Complete  Some recent data might be hidden

## 2020-05-26 NOTE — Progress Notes (Signed)
Mobility Specialist: Progress Note   05/26/20 1443  Mobility  Activity  (Cancel)   Pt prepping for discharge per RN.   [facility] Anahit Klumb Mobility Specialist

## 2020-05-26 NOTE — Progress Notes (Signed)
RN ambulated patient in wallway approximately 325 feet. Patient used front wheel walker and ambulated with steady gate. Pt. Briefly desat to 85% on room air, however when directed on proper breathing techniques and taking rest breaks his oxygen rapidly increased to 99% on room air. The patient verbalized and demonstrated understanding of these techniques while ambulating back to his room. Brynda Rim, RN

## 2020-05-30 ENCOUNTER — Encounter (INDEPENDENT_AMBULATORY_CARE_PROVIDER_SITE_OTHER): Payer: Self-pay

## 2020-05-30 ENCOUNTER — Other Ambulatory Visit: Payer: Self-pay

## 2020-05-30 DIAGNOSIS — Z4802 Encounter for removal of sutures: Secondary | ICD-10-CM

## 2020-05-31 ENCOUNTER — Telehealth: Payer: Self-pay | Admitting: Physician Assistant

## 2020-05-31 NOTE — Telephone Encounter (Signed)
Patient daughter calling to discuss patient recent dc from hospital for cabg x 4 and cocaine use.    Daughter states she is the POA and would like some help discussing with patient / Careers adviser, cards , SW risks of using after procedure and treatment options.     Please call to discuss

## 2020-05-31 NOTE — Telephone Encounter (Signed)
I spoke to pt's daughter, and notified her that I do not have anything notified in pt's chart, DPR, Advanced Directives, or any documents scanned allowing me to speak with her regarding pt. Daughter verbalized understanding. She says she does have paperwork and is going to either fax to [place] or bring to our office. I wanted to make you aware so that you can look for this fax to come through. Thank you!

## 2020-06-05 ENCOUNTER — Other Ambulatory Visit: Payer: Self-pay | Admitting: *Deleted

## 2020-06-05 ENCOUNTER — Ambulatory Visit: Payer: BLUE CROSS/BLUE SHIELD | Admitting: Physician Assistant

## 2020-06-05 DIAGNOSIS — Z951 Presence of aortocoronary bypass graft: Secondary | ICD-10-CM

## 2020-06-06 ENCOUNTER — Encounter: Payer: Self-pay | Admitting: Thoracic Surgery (Cardiothoracic Vascular Surgery)

## 2020-06-06 ENCOUNTER — Ambulatory Visit
Admission: RE | Admit: 2020-06-06 | Discharge: 2020-06-06 | Disposition: A | Discharge status: Home / Self Care | Payer: BLUE CROSS/BLUE SHIELD | Source: Ambulatory Visit | Attending: Thoracic Surgery (Cardiothoracic Vascular Surgery) | Admitting: Thoracic Surgery (Cardiothoracic Vascular Surgery)

## 2020-06-06 ENCOUNTER — Ambulatory Visit (INDEPENDENT_AMBULATORY_CARE_PROVIDER_SITE_OTHER): Payer: Self-pay | Admitting: Thoracic Surgery (Cardiothoracic Vascular Surgery)

## 2020-06-06 ENCOUNTER — Other Ambulatory Visit: Payer: Self-pay

## 2020-06-06 VITALS — BP 141/83 | HR 106 | Resp 20 | Ht 66.0 in | Wt 209.0 lb

## 2020-06-06 DIAGNOSIS — Z951 Presence of aortocoronary bypass graft: Secondary | ICD-10-CM

## 2020-06-06 DIAGNOSIS — J984 Other disorders of lung: Secondary | ICD-10-CM | POA: Diagnosis not present

## 2020-06-06 DIAGNOSIS — I2510 Atherosclerotic heart disease of native coronary artery without angina pectoris: Secondary | ICD-10-CM

## 2020-06-06 DIAGNOSIS — Z8679 Personal history of other diseases of the circulatory system: Secondary | ICD-10-CM | POA: Diagnosis not present

## 2020-06-06 DIAGNOSIS — J9811 Atelectasis: Secondary | ICD-10-CM | POA: Diagnosis not present

## 2020-06-06 NOTE — Progress Notes (Signed)
CSW consulted to meet with pt and pt daughter regarding substance abuse resources.  During visit with MD pt is adamantly denying substance abuse concerns but pt daughter still requesting outpatient substance abuse rehab resources.  CSW met with pt and pt dtr and provided with list of outpatient and inpatient resources.  Discussed different options that would accept pt insurance and the different structures of the programs- pt daughter very appreciative of assistance.    CSW provided pt with CSW number and encouraged her to call with questions or if rehab center needed further documentation to complete referral.  Will continue to follow and assist as needed  Jorge Ny, [facility] Desk#: [phone] Cell#: [phone]

## 2020-06-06 NOTE — Progress Notes (Signed)
      [address]       [place] [address]             [phone]        Matthew Tapia [place] Medical Record #[id number] Date of Birth: May 29, 1965  Referring: [place] Ouch, MD Primary Care: Patient, No Pcp Per Primary Cardiologist:Muhammad Kirke Corin, MD  Reason for visit:   follow-up  History of Present Illness:     [name] comes in for his first follow-up appointment.  Overall he states that he is doing well, but I have had a long conversation with his daughter last week in regards to his ongoing cocaine and amphetamine abuse.  She states that her aunt told her that he was using again at his mother's house.  His mother is actually called EMS since being discharged because he "said that he was not feeling right.  He was noted to be dehydrated.  Physical Exam: BP (!) 141/83 (BP Location: Right Arm, Patient Position: Sitting)   Pulse (!) 106   Resp 20   Ht 5\' 6"  (1.676 m)   Wt 209 lb (94.8 kg)   SpO2 95% Comment: RA  BMI 33.73 kg/m   Alert NAD Incision clean.  Sternum stable Abdomen soft, ND No peripheral edema   Diagnostic Studies & Laboratory data: CXR: Clear     Assessment / Plan:   55 year old male status post coronary artery bypass grafting, history of cocaine and amphetamine abuse concern for ongoing usage.  He continues to deny any use at this appointment.  His daughter was present at today's visit as well as our 52.  She was provided with contact information for drug rehab programs for further assistance.  I again stressed the importance of staying off of cocaine and needs: Beta-blockers in the setting of a fresh coronary artery bypass grafting.  [name] will follow-up in 1 month with another chest x-ray.   Tanya Nones 06/06/2020 3:06 PM

## 2020-06-11 ENCOUNTER — Ambulatory Visit: Payer: BLUE CROSS/BLUE SHIELD | Admitting: Physician Assistant

## 2020-06-11 ENCOUNTER — Telehealth: Payer: Self-pay | Admitting: Physician Assistant

## 2020-06-11 NOTE — Telephone Encounter (Signed)
Please call to discuss medications.  

## 2020-06-12 NOTE — Telephone Encounter (Signed)
Attempted to call patient back. His VM was full.

## 2020-06-13 ENCOUNTER — Ambulatory Visit: Payer: BLUE CROSS/BLUE SHIELD | Admitting: Nurse Practitioner

## 2020-06-13 ENCOUNTER — Telehealth: Payer: Self-pay | Admitting: Cardiothoracic Surgery

## 2020-06-13 DIAGNOSIS — Z951 Presence of aortocoronary bypass graft: Secondary | ICD-10-CM

## 2020-06-13 MED ORDER — OXYCODONE HCL 5 MG PO TABS: 5.0000 mg | ORAL_TABLET | Freq: Four times a day (QID) | ORAL | 0 refills | Status: AC | PRN

## 2020-06-13 NOTE — Telephone Encounter (Signed)
No answer/Voicemail box is full.  

## 2020-06-13 NOTE — Telephone Encounter (Signed)
Pt called requesting refill of pain medication due to pain experienced after coughing and sneezing. He is s/p CABG.  Prescription entered electronically to [facility] in [place], [place]  Gray Doering Z. Vickey Sages, MD [phone]

## 2020-06-14 ENCOUNTER — Telehealth: Payer: Self-pay

## 2020-06-14 ENCOUNTER — Ambulatory Visit: Payer: Self-pay | Admitting: Thoracic Surgery (Cardiothoracic Vascular Surgery)

## 2020-06-14 ENCOUNTER — Other Ambulatory Visit: Payer: Self-pay | Admitting: Thoracic Surgery (Cardiothoracic Vascular Surgery)

## 2020-06-14 MED ORDER — OXYCODONE HCL 5 MG PO TABS: 5.0000 mg | ORAL_TABLET | ORAL | 0 refills | Status: DC | PRN

## 2020-06-14 NOTE — Telephone Encounter (Signed)
Spoke with patient and confirmed medications and he had called to check on pain medication but states that Dr. Cliffton Asters took care of this. He did want to confirm if he should remain on medications until upcoming visit and advised that is correct. He verbalized understanding of our conversation, agreement with plan, and had no further questions at this time.

## 2020-06-14 NOTE — Telephone Encounter (Signed)
No answer/voicemail box is full. Also called home number and call could not be completed at this time.

## 2020-06-14 NOTE — Telephone Encounter (Signed)
Spoke with patients mother and requested to have him call our office back to review his questions. She will pass this on to him.

## 2020-06-14 NOTE — Telephone Encounter (Signed)
Patient called on call physician last night regarding pain and requesting medication refill.  Refill was attempted but error was made.  Patient did not receive RX.  Dr. Cliffton Asters was made aware and will send in prescription electronically to patient's preferred pharmacy, [facility] [address] in [place], [place].  Patient aware and acknowledged receipt.

## 2020-06-19 ENCOUNTER — Telehealth: Payer: Self-pay | Admitting: *Deleted

## 2020-06-19 NOTE — Telephone Encounter (Signed)
VM left by Matthew Tapia stating he is experiencing right side chest pain after sneezing this weekend. Unsuccessful attempt to return phone call. VM inbox full, unable to leave message. Will try again at another time.

## 2020-06-19 NOTE — Telephone Encounter (Signed)
Spoke with Matthew Tapia regarding right sided chest pain. Pt states he has tested positive for Covid. He has been coughing which has created pain in the right side of his chest. Advised pt to take robitussin which is on his medication list. Also advised pt to contact his PCP to assist with management of his Covid symptoms. Pt verbalized understanding.

## 2020-06-26 ENCOUNTER — Other Ambulatory Visit: Payer: Self-pay | Admitting: Physician Assistant

## 2020-06-26 ENCOUNTER — Ambulatory Visit: Payer: BLUE CROSS/BLUE SHIELD | Admitting: Physician Assistant

## 2020-07-10 NOTE — Progress Notes (Deleted)
Cardiology Office Note    Date:  07/10/2020   ID:  Matthew Tapia, DOB 1965/07/27, MRN [id number]  PCP:  Patient, No Pcp Per  Cardiologist:  Lorine Bears, MD  Electrophysiologist:  None   Chief Complaint: Hospital follow up  History of Present Illness:   Matthew Tapia is a 55 y.o. male with history of CAD status post recent four-vessel CABG on 05/21/2020 as outlined below, HFrEF secondary to ICM, polysubstance use with self-medicating with cocaine and amphetamines for anxiety and chronic pain, HLD, and tobacco use who presents for hospital follow-up after recent admission to [facility] on with inferior ST elevation MI 05/14/2020 with subsequent transfer to [facility] for CABG with discharge on 05/26/2020.  He was admitted to [facility] on 05/14/2020 with inferior ST elevation MI with evidence of Q waves and inferior ST segment elevation on initial EKG.  LHC showed multivessel CAD with thrombus in the RCA which was treated successfully with aspiration thrombectomy.  Echo on 05/15/2020 showed an EF of 35 to 40%, grade 1 diastolic dysfunction, severe hypokinesis of the entire inferolateral wall and inferior wall, normal RV systolic function and ventricular cavity size, and no significant valvular abnormalities.  He was taken back to the cath lab on 05/16/2020 which showed restoration of flow to the RCA and no further intervention was required.  It was felt he would require CABG and therefore was transferred to [facility].  He underwent repeat LHC on 05/20/2020 in the context of worsening chest pain with ST/T changes on EKG.  Cardiac cath at that time showed acute occlusion of the RCA which was treated with PTCA with restoration of flow.  He was taken to the OR on 05/21/2020 and underwent four-vessel CABG utilizing LIMA to LAD, SVG to OM1, SVG to OM 2, and SVG to PDA.  In the postoperative setting he did not require pressor support.  Relative hypotension precluded escalation of medical therapy.  He was mildly  volume overloaded and underwent diuresis.  ***   Labs independently reviewed: 04/2020 - potassium 3.6, BUN 13, serum creatinine 0.71, Hgb 9.5, PLT 248, magnesium 2.1, A1c 5.8, TC 121, TG 161, HDL 25, LDL 64 03/2019 - albumin 4.2, AST/ALT normal  Past Medical History:  Diagnosis Date  . Anxiety   . CAD, multiple vessel   . Dyslipidemia, goal LDL below 70   . Former smoker    Quit 2021  . HFrEF (heart failure with reduced ejection fraction) (HCC)   . Polysubstance abuse (HCC)    UDS positive for cocaine, amphetamines, benzos 05/15/20  . ST elevation myocardial infarction (STEMI) of inferior wall [facility])     Past Surgical History:  Procedure Laterality Date  . CORONARY ANGIOGRAPHY N/A 05/20/2020   Procedure: CORONARY ANGIOGRAPHY;  Surgeon: Kathleene Hazel, MD;  Location: [facility] INVASIVE CV LAB;  Service: Cardiovascular;  Laterality: N/A;  . CORONARY ARTERY BYPASS GRAFT N/A 05/21/2020   Procedure: CORONARY ARTERY BYPASS GRAFTING TIMES FOUR USING LEFT INTERNAL MAMMARY ARTERY  AND ENDOSCOPICALLY HARVESTED RIGHT GREATER SAPHANOUS VEIN.;  Surgeon: Corliss Skains, MD;  Location: [facility] OR;  Service: Open Heart Surgery;  Laterality: N/A;  flow track  . CORONARY THROMBECTOMY N/A 05/20/2020   Procedure: Coronary Thrombectomy;  Surgeon: Kathleene Hazel, MD;  Location: [facility] INVASIVE CV LAB;  Service: Cardiovascular;  Laterality: N/A;  . CORONARY/GRAFT ACUTE MI REVASCULARIZATION N/A 05/14/2020   Procedure: Coronary/Graft Acute MI Revascularization;  Surgeon: Iran Ouch, MD;  Location: [facility] INVASIVE CV LAB;  Service:  Cardiovascular;  Laterality: N/A;  . CORONARY/GRAFT ACUTE MI REVASCULARIZATION N/A 05/20/2020   Procedure: Coronary/Graft Acute MI Revascularization;  Surgeon: Kathleene HazelMcAlhany, Christopher D, MD;  Location: [facility] INVASIVE CV LAB;  Service: Cardiovascular;  Laterality: N/A;  . ENDOVEIN HARVEST OF GREATER SAPHENOUS VEIN Right 05/21/2020   Procedure: ENDOVEIN HARVEST OF RIGHT  GREATER SAPHENOUS VEIN;  Surgeon: Corliss SkainsLightfoot, Harrell O, MD;  Location: [facility] OR;  Service: Open Heart Surgery;  Laterality: Right;  . INTRAVASCULAR PRESSURE WIRE/FFR STUDY N/A 05/16/2020   Procedure: INTRAVASCULAR PRESSURE WIRE/FFR STUDY;  Surgeon: Iran OuchArida, Muhammad A, MD;  Location: [facility] INVASIVE CV LAB;  Service: Cardiovascular;  Laterality: N/A;  . LEFT HEART CATH AND CORONARY ANGIOGRAPHY N/A 05/14/2020   Procedure: LEFT HEART CATH AND CORONARY ANGIOGRAPHY;  Surgeon: Iran OuchArida, Muhammad A, MD;  Location: [facility] INVASIVE CV LAB;  Service: Cardiovascular;  Laterality: N/A;  . LEFT HEART CATH AND CORONARY ANGIOGRAPHY N/A 05/16/2020   Procedure: LEFT HEART CATH AND CORONARY ANGIOGRAPHY;  Surgeon: Iran OuchArida, Muhammad A, MD;  Location: [facility] INVASIVE CV LAB;  Service: Cardiovascular;  Laterality: N/A;  . SHOULDER SURGERY    . TEE WITHOUT CARDIOVERSION N/A 05/21/2020   Procedure: TRANSESOPHAGEAL ECHOCARDIOGRAM (TEE);  Surgeon: Corliss SkainsLightfoot, Harrell O, MD;  [facility];  Service: Open Heart Surgery;  Laterality: N/A;    Current Medications: No outpatient medications have been marked as taking for the 07/11/20 encounter (Appointment) with Sondra Bargesunn, Finnleigh Marchetti M, PA-C.    Allergies:   Patient has no known allergies.   Social History   Socioeconomic History  . Marital status: Married    Spouse name: Not on file  . Number of children: Not on file  . Years of education: Not on file  . Highest education level: Not on file  Occupational History  . Not on file  Tobacco Use  . Smoking status: Current Every Day Smoker    Types: Cigarettes  . Smokeless tobacco: Never Used  Substance and Sexual Activity  . Alcohol use: No  . Drug use: Never  . Sexual activity: Not on file  Other Topics Concern  . Not on file  Social History Narrative  . Not on file   Social Determinants of Health   Financial Resource Strain: Not on file  Food Insecurity: Not on file  Transportation Needs: Not on file  Physical Activity: Not on  file  Stress: Not on file  Social Connections: Not on file     Family History:  The patient's family history includes Heart attack in his father.  ROS:   ROS   EKGs/Labs/Other Studies Reviewed:    Studies reviewed were summarized above. The additional studies were reviewed today:  LHC 05/14/2020:   There is moderate left ventricular systolic dysfunction.  LV end diastolic pressure is severely elevated.  Prox RCA to Dist RCA lesion is 100% stenosed.  Post intervention, there is a 80% residual stenosis.  Balloon angioplasty was performed using a BALLOON TREK RX 2.5X20.  Mid Cx to Dist Cx lesion is 95% stenosed.  2nd Mrg lesion is 80% stenosed.  Prox LAD to Mid LAD lesion is 30% stenosed.  Mid LAD to Dist LAD lesion is 40% stenosed.   1.  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  In addition, there is high-grade stenosis in the mid left circumflex at the bifurcation of OM 2.  The LAD has mild nonobstructive  disease. 2.  Moderately reduced LV systolic function with an EF of 30 to 35% and severely elevated left ventricular end-diastolic pressure. 3.  Aspiration thrombectomy and balloon angioplasty was done to the right coronary artery with partial success.  I was able to establish TIMI II flow but not able to clear all the thrombus.  Recommendations: Continue Aggrastat infusion for 48 hours. Continue dual antiplatelet therapy with aspirin and ticagrelor. The plan is to relook angiography on Thursday to see if the right coronary artery is salvageable and also to perform a staged PCI on the left circumflex. I started small dose carvedilol for cardiomyopathy. Diuresis as needed. High-dose atorvastatin. Smoking cessation is advised. __________  2D echo 05/15/2020: 1. Left ventricular  ejection fraction, by estimation, is 35 to 40%. The  left ventricle has moderately decreased function. The left ventricle  demonstrates regional wall motion abnormalities (see scoring  diagram/findings for description). Left ventricular  diastolic parameters are consistent with Grade I diastolic dysfunction  (impaired relaxation). There is severe hypokinesis of the left  ventricular, entire inferolateral wall and inferior wall. The average left  ventricular global longitudinal strain is  -11.6 %. The global longitudinal strain is abnormal.  2. Right ventricular systolic function is normal. The right ventricular  size is normal.  3. The mitral valve is normal in structure. No evidence of mitral valve  regurgitation.  4. The aortic valve was not well visualized. Aortic valve regurgitation  is not visualized.  5. The inferior vena cava is normal in size with greater than 50%  respiratory variability, suggesting right atrial pressure of 3 mmHg. __________  LHC 05/16/2020:  Prox LAD to Mid LAD lesion is 70% stenosed.  Mid LAD to Dist LAD lesion is 40% stenosed.  Mid Cx to Dist Cx lesion is 95% stenosed.  2nd Mrg lesion is 80% stenosed.  Prox RCA to Dist RCA lesion is 80% stenosed.  1st Diag lesion is 99% stenosed.   1.  Severe three-vessel coronary artery disease.  Thrombus burden in the right coronary artery improved significantly in the vessel has normal TIMI-3 flow but has diffuse disease from the proximal to the distal segment before the bifurcation.  In addition, the patient seems to have significant proximal to mid LAD disease which was highly significant by fractional flow reserve evaluation with an IFR ratio of 0.76.  There is complex mid left circumflex disease at the bifurcation of OM 2.  There is also subtotal occlusion of first diagonal which seems to be diffusely diseased.  2.  Left ventricular angiography was not performed.  EF was 35 to 40% by echo. 3.  Significant  improvement in left ventricular end-diastolic pressure which is currently only mildly elevated at 17 mmHg.  Recommendations: Given diffuse disease in the right coronary artery and the presence of three-vessel coronary artery disease, recommend evaluation for CABG. I am going to discontinue treatment with Brilinta. Given residual thrombus in the right coronary artery, recommend continuing Aggrastat infusion until CABG. Suspect that CABG can be done early next week. I will make arrangements for transfer to [facility]. __________  [facility] 05/20/2020:   Mid LAD to Dist LAD lesion is 40% stenosed.  1st Diag lesion is 99% stenosed.  Prox LAD to Mid LAD lesion is 70% stenosed.  Mid Cx to Dist Cx lesion is 95% stenosed.  2nd Mrg lesion is 80% stenosed.  Prox RCA to Dist RCA lesion is 80% stenosed.  Prox RCA lesion is 100% stenosed.  Balloon angioplasty was  performed using a BALLOON EMERGE MR 2.5X15.  Post intervention, there is a 80% residual stenosis.   1. Acute occlusion of the proximal RCA 2. Successful PTCA/ balloon angioplasty of the proximal RCA 3. No change in severe disease in the LAD and circumflex  Recommendations: Will continue ASA, statin and beta blocker. While awaiting bypass, will transition from Aggrastat to Cangrelor drip due to hospital shortage of Aggrastat. CT surgery notified of change in clinical situation. I think he is stable to wait for bypass tomorrow. Will keep NPO for now.  __________  Intraoperative TEE 05/21/2020: POST-OP IMPRESSIONS  Overall, there were no significant changes from pre-bypass.   PRE-OP FINDINGS  Left Ventricle: The left ventricle has moderately reduced systolic  function, with an ejection fraction of 35-40%. The cavity size was left  ventricular size was not assessed. There is no increase in left  ventricular wall thickness.   Right Ventricle: The right ventricle has normal systolic function. The  cavity was not assessed.  There is no increase in right ventricular wall  thickness.   Left Atrium: Left atrial size was not assessed. The left atrial appendage  is well visualized and there is no evidence of thrombus present.   Right Atrium: Right atrial size was not assessed.   Interatrial Septum: No atrial level shunt detected by color flow Doppler.   Pericardium: There is no evidence of pericardial effusion.   Mitral Valve: The mitral valve is normal in structure. Mitral valve  regurgitation is mild by color flow Doppler. The MR jet is  centrally-directed.   Tricuspid Valve: The tricuspid valve was normal in structure. Tricuspid  valve regurgitation was not visualized by color flow Doppler.   Aortic Valve: The aortic valve is normal in structure. Aortic valve  regurgitation was not visualized by color flow Doppler.   Pulmonic Valve: The pulmonic valve was normal in structure.  Pulmonic valve regurgitation is not visualized by color flow Doppler.   EKG:  EKG is ordered today.  The EKG ordered today demonstrates ***  Recent Labs: 05/14/2020: B Natriuretic Peptide 534.9 05/22/2020: Magnesium 2.1 05/24/2020: BUN 13; Creatinine, Ser 0.71; Hemoglobin 9.5; Platelets 248; Potassium 3.6; Sodium 131  Recent Lipid Panel    Component Value Date/Time   CHOL 121 05/15/2020 0322   TRIG 161 (H) 05/15/2020 0322   HDL 25 (L) 05/15/2020 0322   CHOLHDL 4.8 05/15/2020 0322   VLDL 32 05/15/2020 0322   LDLCALC 64 05/15/2020 0322    PHYSICAL EXAM:    VS:  There were no vitals taken for this visit.  BMI: There is no height or weight on file to calculate BMI.  Physical Exam  Wt Readings from Last 3 Encounters:  06/06/20 209 lb (94.8 kg)  05/26/20 204 lb 1.6 oz (92.6 kg)  05/16/20 197 lb 8.5 oz (89.6 kg)     ASSESSMENT & PLAN:   1. CAD involving native coronary arteries with recent inferior ST elevation MI status post recent four-vessel CABG without***angina: ***  2. HFrEF secondary to ICM:  3. HLD: LDL  64 in 04/2020 normal LFT in 03/2019. ***  4. Polysubstance use:  Disposition: F/u with Dr. Kirke Corin or an APP in ***.   Medication Adjustments/Labs and Tests Ordered: Current medicines are reviewed at length with the patient today.  Concerns regarding medicines are outlined above. Medication changes, Labs and Tests ordered today are summarized above and listed in the Patient Instructions accessible in Encounters.   SignedEula Listen, PA-C 07/10/2020 8:46 AM  [address] [place], [place] [address] [phone]

## 2020-07-11 ENCOUNTER — Ambulatory Visit: Payer: BLUE CROSS/BLUE SHIELD | Admitting: Physician Assistant

## 2020-07-11 ENCOUNTER — Other Ambulatory Visit: Payer: Self-pay | Admitting: Thoracic Surgery (Cardiothoracic Vascular Surgery)

## 2020-07-11 DIAGNOSIS — Z951 Presence of aortocoronary bypass graft: Secondary | ICD-10-CM

## 2020-07-12 ENCOUNTER — Ambulatory Visit: Payer: Self-pay | Admitting: Thoracic Surgery (Cardiothoracic Vascular Surgery)

## 2020-07-16 ENCOUNTER — Ambulatory Visit (INDEPENDENT_AMBULATORY_CARE_PROVIDER_SITE_OTHER): Payer: BLUE CROSS/BLUE SHIELD | Admitting: Medical

## 2020-07-16 ENCOUNTER — Encounter: Payer: Self-pay | Admitting: Medical

## 2020-07-16 ENCOUNTER — Other Ambulatory Visit: Payer: Self-pay

## 2020-07-16 VITALS — BP 122/78 | HR 72 | Ht 66.0 in | Wt 213.0 lb

## 2020-07-16 DIAGNOSIS — I2119 ST elevation (STEMI) myocardial infarction involving other coronary artery of inferior wall: Secondary | ICD-10-CM

## 2020-07-16 DIAGNOSIS — I2510 Atherosclerotic heart disease of native coronary artery without angina pectoris: Secondary | ICD-10-CM | POA: Diagnosis not present

## 2020-07-16 DIAGNOSIS — Z951 Presence of aortocoronary bypass graft: Secondary | ICD-10-CM

## 2020-07-16 DIAGNOSIS — I10 Essential (primary) hypertension: Secondary | ICD-10-CM | POA: Diagnosis not present

## 2020-07-16 DIAGNOSIS — E782 Mixed hyperlipidemia: Secondary | ICD-10-CM

## 2020-07-16 DIAGNOSIS — I255 Ischemic cardiomyopathy: Secondary | ICD-10-CM

## 2020-07-16 DIAGNOSIS — F1990 Other psychoactive substance use, unspecified, uncomplicated: Secondary | ICD-10-CM

## 2020-07-16 MED ORDER — ENTRESTO 24-26 MG PO TABS: 1.0000 | ORAL_TABLET | Freq: Two times a day (BID) | ORAL | 11 refills | Status: DC

## 2020-07-16 NOTE — Patient Instructions (Signed)
Medication Instructions:  Your physician has recommended you make the following change in your medication:   1. START Entresto 24/26 mg twice a day  *If you need a refill on your cardiac medications before your next appointment, please call your pharmacy*   Lab Work: CMET & Lipid panel in 1 week  Go to [facility] entrance (No appointment needed) for labs in one week March 1st. Please make sure to not eat or drink anything after midnight before except sip of water with pills.   If you have labs (blood work) drawn today and your tests are completely normal, you will receive your results only by: Marland Kitchen MyChart Message (if you have MyChart) OR . A paper copy in the mail If you have any lab test that is abnormal or we need to change your treatment, we will call you to review the results.   Testing/Procedures: None   Follow-Up: At [facility], you and your health needs are our priority.  As part of our continuing mission to provide you with exceptional heart care, we have created designated Provider Care Teams.  These Care Teams include your primary Cardiologist (physician) and Advanced Practice Providers (APPs -  Physician Assistants and Nurse Practitioners) who all work together to provide you with the care you need, when you need it.  We recommend signing up for the patient portal called "MyChart".  Sign up information is provided on this After Visit Summary.  MyChart is used to connect with patients for Virtual Visits (Telemedicine).  Patients are able to view lab/test results, encounter notes, upcoming appointments, etc.  Non-urgent messages can be sent to your provider as well.   To learn more about what you can do with MyChart, go to [email or web address].    Your next appointment:   1 month(s)  The format for your next appointment:   In Person  Provider:   Lorine Bears, MD or Nicolasa Ducking, NP

## 2020-07-16 NOTE — Progress Notes (Signed)
Cardiology Office Note:    Date:  07/16/2020   ID:  Matthew Tapia, DOB 1965/06/10, MRN [id number]  PCP:  Patient, No Pcp Per  [facility] Cardiologist:  Lorine Bears, MD  [facility] Electrophysiologist:  None   Referring MD: No ref. provider found   Chief Complaint: CABG follow-up  History of Present Illness:    Matthew Tapia is a 55 y.o. male with a hx of substance abuse by self medicating with cocaine and amphetamines for anxiety and chronic pain, HTN, dyslipidemia, tobacco use who was recently admitted for STEMI and subsequently underwent CABG.   The patient was admitted 05/14/20 with STEMI. EKG showed Q waves and inferior ST segment elevations. Troponin levels were positive. He was take to the cath lab urgently and was found to have multivessel disease with thrombus in the RCA. This was cleared with aspiration thrombectomy. He was started on Aggrastat and Brilinta.Echo 12/22 showed decreased EF 35-40% with G1DD, severe hypokinesis of the left ventricular inferior wall.  He was taken back to the cath lab 05/16/20 which showed restoration of flow to the RCA and no further intervention was required. Patient was eventually transferred to [facility] for CABG and this was scheduled for 1/28. However, on 05/20/20 the patient developed worsening chest pain with ST changes. Cath showed acute occlusion of the RCA which was treated with PTCA and balloon angioplasty with restoration of flow. He was taken to the operating room 05/21/20 and underwent CABG x 4, LIMA to LAD, SVG to OM1, SVG to OM2, and SVG to PDA. Post-op course was uneventful.   Patient was seen by CTTS 06/07/19 and patient was accompanied by daughter and Child psychotherapist. He denied drug use and was referred to drug rehab program.   Today, the patient is requesting paper work for short term disability. He's been out of work since May 14, 2020. Last paycheck January 14th and he's been on unpaid leave since that. He is a job  Software engineer. He says it's a very stress full job and might look for another job. From a cardiac standpoint he has been relatively stable. He had covid January after being discharged form the hospital. He had a right sided chest soreness which was worse with cough when he had COVID. He still has the right sided chest soreness that is reproducible with palpation or movement. He plans on doing cardiac rehab when able. No breathing issues. Overall energy level is better. Hasn't made many diet changes, in fact weight is up; diet changes were discussed. No lower leg edema, orthopnea, pnd. He has anxiety and takes Lorazepam. No lightheadedness or dizziness.  He denies alcohol and cocaine use. Says he will not likely go through the drug rehab program. He is smoking, but wants quit, 3-4 cigarettes daily.  He hasn't done much walking, longest is 10 minutes, and denies anginal symptoms. EKG shows SR with some new TWI. Vitals today are good.  Past Medical History:  Diagnosis Date  . Anxiety   . CAD, multiple vessel   . Dyslipidemia, goal LDL below 70   . Former smoker    Quit 2021  . HFrEF (heart failure with reduced ejection fraction) (HCC)   . Polysubstance abuse (HCC)    UDS positive for cocaine, amphetamines, benzos 05/15/20  . ST elevation myocardial infarction (STEMI) of inferior wall [facility])     Past Surgical History:  Procedure Laterality Date  . CORONARY ANGIOGRAPHY N/A 05/20/2020   Procedure: CORONARY ANGIOGRAPHY;  Surgeon: Kathleene Hazel, MD;  Location: [facility] INVASIVE CV LAB;  Service: Cardiovascular;  Laterality: N/A;  . CORONARY ARTERY BYPASS GRAFT N/A 05/21/2020   Procedure: CORONARY ARTERY BYPASS GRAFTING TIMES FOUR USING LEFT INTERNAL MAMMARY ARTERY  AND ENDOSCOPICALLY HARVESTED RIGHT GREATER SAPHANOUS VEIN.;  Surgeon: Corliss Skains, MD;  Location: [facility] OR;  Service: Open Heart Surgery;  Laterality: N/A;  flow track  . CORONARY THROMBECTOMY N/A  05/20/2020   Procedure: Coronary Thrombectomy;  Surgeon: Kathleene Hazel, MD;  Location: [facility] INVASIVE CV LAB;  Service: Cardiovascular;  Laterality: N/A;  . CORONARY/GRAFT ACUTE MI REVASCULARIZATION N/A 05/14/2020   Procedure: Coronary/Graft Acute MI Revascularization;  Surgeon: Iran Ouch, MD;  Location: [facility] INVASIVE CV LAB;  Service: Cardiovascular;  Laterality: N/A;  . CORONARY/GRAFT ACUTE MI REVASCULARIZATION N/A 05/20/2020   Procedure: Coronary/Graft Acute MI Revascularization;  Surgeon: Kathleene Hazel, MD;  Location: [facility] INVASIVE CV LAB;  Service: Cardiovascular;  Laterality: N/A;  . ENDOVEIN HARVEST OF GREATER SAPHENOUS VEIN Right 05/21/2020   Procedure: ENDOVEIN HARVEST OF RIGHT GREATER SAPHENOUS VEIN;  Surgeon: Corliss Skains, MD;  Location: [facility] OR;  Service: Open Heart Surgery;  Laterality: Right;  . INTRAVASCULAR PRESSURE WIRE/FFR STUDY N/A 05/16/2020   Procedure: INTRAVASCULAR PRESSURE WIRE/FFR STUDY;  Surgeon: Iran Ouch, MD;  Location: [facility] INVASIVE CV LAB;  Service: Cardiovascular;  Laterality: N/A;  . LEFT HEART CATH AND CORONARY ANGIOGRAPHY N/A 05/14/2020   Procedure: LEFT HEART CATH AND CORONARY ANGIOGRAPHY;  Surgeon: Iran Ouch, MD;  Location: [facility] INVASIVE CV LAB;  Service: Cardiovascular;  Laterality: N/A;  . LEFT HEART CATH AND CORONARY ANGIOGRAPHY N/A 05/16/2020   Procedure: LEFT HEART CATH AND CORONARY ANGIOGRAPHY;  Surgeon: Iran Ouch, MD;  Location: [facility] INVASIVE CV LAB;  Service: Cardiovascular;  Laterality: N/A;  . SHOULDER SURGERY    . TEE WITHOUT CARDIOVERSION N/A 05/21/2020   Procedure: TRANSESOPHAGEAL ECHOCARDIOGRAM (TEE);  Surgeon: Corliss Skains, MD;  Location: [facility] OR;  Service: Open Heart Surgery;  Laterality: N/A;    Current Medications: Current Meds  Medication Sig  . acetaminophen (TYLENOL) 325 MG tablet Take 650-975 mg by mouth every 6 (six) hours as needed for mild pain, fever or headache.  Marland Kitchen aspirin  EC 325 MG EC tablet Take 1 tablet (325 mg total) by mouth daily.  Marland Kitchen atorvastatin (LIPITOR) 80 MG tablet Take 1 tablet (80 mg total) by mouth daily.  Marland Kitchen guaiFENesin-dextromethorphan (ROBITUSSIN DM) 100-10 MG/5ML syrup Take 5 mLs by mouth every 4 (four) hours as needed for cough.  Marland Kitchen LORazepam (ATIVAN) 0.5 MG tablet Take 1 tablet (0.5 mg total) by mouth every 8 (eight) hours as needed for anxiety.  . metoprolol tartrate (LOPRESSOR) 25 MG tablet Take 1 tablet (25 mg total) by mouth 2 (two) times daily.  . sacubitril-valsartan (ENTRESTO) 24-26 MG Take 1 tablet by mouth 2 (two) times daily.     Allergies:   Patient has no known allergies.   Social History   Socioeconomic History  . Marital status: Married    Spouse name: Not on file  . Number of children: Not on file  . Years of education: Not on file  . Highest education level: Not on file  Occupational History  . Not on file  Tobacco Use  . Smoking status: Current Some Day Smoker    Types: Cigarettes  . Smokeless tobacco: Never Used  . Tobacco comment: 1-2 cigarettes "every now and then"  Substance and Sexual Activity  . Alcohol use: No  .  Drug use: Never  . Sexual activity: Not on file  Other Topics Concern  . Not on file  Social History Narrative  . Not on file   Social Determinants of Health   Financial Resource Strain: Not on file  Food Insecurity: Not on file  Transportation Needs: Not on file  Physical Activity: Not on file  Stress: Not on file  Social Connections: Not on file     Family History: The patient's family history includes Heart attack in his father.  ROS:   Please see the history of present illness.     All other systems reviewed and are negative.  EKGs/Labs/Other Studies Reviewed:    The following studies were reviewed today:  Echo 05/15/20 1. Left ventricular ejection fraction, by estimation, is 35 to 40%. The  left ventricle has moderately decreased function. The left ventricle  demonstrates  regional wall motion abnormalities (see scoring  diagram/findings for description). Left ventricular  diastolic parameters are consistent with Grade I diastolic dysfunction  (impaired relaxation). There is severe hypokinesis of the left  ventricular, entire inferolateral wall and inferior wall. The average left  ventricular global longitudinal strain is  -11.6 %. The global longitudinal strain is abnormal.  2. Right ventricular systolic function is normal. The right ventricular  size is normal.  3. The mitral valve is normal in structure. No evidence of mitral valve  regurgitation.  4. The aortic valve was not well visualized. Aortic valve regurgitation  is not visualized.  5. The inferior vena cava is normal in size with greater than 50%  respiratory variability, suggesting right atrial pressure of 3 mmHg.   Cardiac cath 05/20/20   Mid LAD to Dist LAD lesion is 40% stenosed.  1st Diag lesion is 99% stenosed.  Prox LAD to Mid LAD lesion is 70% stenosed.  Mid Cx to Dist Cx lesion is 95% stenosed.  2nd Mrg lesion is 80% stenosed.  Prox RCA to Dist RCA lesion is 80% stenosed.  Prox RCA lesion is 100% stenosed.  Balloon angioplasty was performed using a BALLOON EMERGE MR 2.5X15.  Post intervention, there is a 80% residual stenosis.   1. Acute occlusion of the proximal RCA 2. Successful PTCA/ balloon angioplasty of the proximal RCA 3. No change in severe disease in the LAD and circumflex  Recommendations: Will continue ASA, statin and beta blocker. While awaiting bypass, will transition from Aggrastat to Cangrelor drip due to hospital shortage of Aggrastat. CT surgery notified of change in clinical situation. I think he is stable to wait for bypass tomorrow. Will keep NPO for now.   Coronary Diagrams   Diagnostic Dominance: Right    Intervention        EKG:  EKG is  ordered today.  The ekg ordered today demonstrates NSR, 72 bpm, TWI inferolateral  leads  Recent Labs: 05/14/2020: B Natriuretic Peptide 534.9 05/22/2020: Magnesium 2.1 05/24/2020: BUN 13; Creatinine, Ser 0.71; Hemoglobin 9.5; Platelets 248; Potassium 3.6; Sodium 131  Recent Lipid Panel    Component Value Date/Time   CHOL 121 05/15/2020 0322   TRIG 161 (H) 05/15/2020 0322   HDL 25 (L) 05/15/2020 0322   CHOLHDL 4.8 05/15/2020 0322   VLDL 32 05/15/2020 0322   LDLCALC 64 05/15/2020 0322     Physical Exam:    VS:  BP 122/78   Pulse 72   Ht 5\' 6"  (1.676 m)   Wt 213 lb (96.6 kg)   BMI 34.38 kg/m     Wt Readings from Last  3 Encounters:  07/16/20 213 lb (96.6 kg)  06/06/20 209 lb (94.8 kg)  05/26/20 204 lb 1.6 oz (92.6 kg)     GEN:  Well nourished, well developed in no acute distress HEENT: Normal NECK: No JVD; No carotid bruits LYMPHATICS: No lymphadenopathy CARDIAC: RRR, no murmurs, rubs, gallops RESPIRATORY:  Clear to auscultation without rales, wheezing or rhonchi  ABDOMEN: Soft, non-tender, non-distended MUSCULOSKELETAL:  No edema; No deformity  SKIN: Warm and dry NEUROLOGIC:  Alert and oriented x 3 PSYCHIATRIC:  Normal affect   ASSESSMENT:    1. Coronary artery disease involving native heart, unspecified vessel or lesion type, unspecified whether angina present   2. S/P CABG x 4   3. Essential hypertension   4. Hyperlipidemia, mixed   5. Substance use   6. Ischemic cardiomyopathy   7. ST elevation myocardial infarction (STEMI) of inferior wall, subsequent episode of care [facility](HCC)    PLAN:    In order of problems listed above:  CAD s/p CABG x4 Patient is stable from a cardiac perspective. He has been seen  By CTTS in follow-up. He has reproducible right sided chest soreness on exam. Patient is walking up to 10 minutes and denies anginal symptoms with this. EKG with nonspecific EKG changes consistent with prior MI. Diet and lifestyle changes were discussed. He plans on doing cardiac rehab when able. Scar appears to be healing well. Continue  Aspirin 325mg  daily.   ICM Echo during admission showed decreased EF 35-40%. ACE/ARB/Entresto held during admission for labile pressures. He is euvolemic on exam. Continue Lopressor.  BP today is good, start Entresto 24-26mg  BID with a BMET in 1 week. He will eventually need a repeat echo to re-evaluate pump function.  HTN BP normotensive. Add Entresto as above. Continue Lopressor.   HLD Continue statin. LDL 64 05/15/20.Check fasting lipid panel and LFTs in 1 week.  Substance abuse History of cocaine and amphetamines. He denies recurrent drug use. He is still smoking, cessation was encouraged.  Disposition: Follow up in 1 month(s) with APP/MD    Signed, Britaney Espaillat David StallH Allice Garro, PA-C  07/16/2020 4:23 PM    [facility]

## 2020-07-26 ENCOUNTER — Other Ambulatory Visit: Payer: Self-pay

## 2020-07-26 ENCOUNTER — Other Ambulatory Visit: Payer: Self-pay | Admitting: *Deleted

## 2020-07-26 ENCOUNTER — Ambulatory Visit
Admission: RE | Admit: 2020-07-26 | Discharge: 2020-07-26 | Disposition: A | Discharge status: Home / Self Care | Payer: BLUE CROSS/BLUE SHIELD | Source: Ambulatory Visit | Attending: Thoracic Surgery (Cardiothoracic Vascular Surgery) | Admitting: Thoracic Surgery (Cardiothoracic Vascular Surgery)

## 2020-07-26 ENCOUNTER — Ambulatory Visit (INDEPENDENT_AMBULATORY_CARE_PROVIDER_SITE_OTHER): Payer: Self-pay | Admitting: Thoracic Surgery (Cardiothoracic Vascular Surgery)

## 2020-07-26 ENCOUNTER — Encounter: Payer: Self-pay | Admitting: Thoracic Surgery (Cardiothoracic Vascular Surgery)

## 2020-07-26 VITALS — BP 119/78 | HR 87 | Temp 97.6°F | Resp 20 | Ht 66.0 in | Wt 216.0 lb

## 2020-07-26 DIAGNOSIS — I2510 Atherosclerotic heart disease of native coronary artery without angina pectoris: Secondary | ICD-10-CM

## 2020-07-26 DIAGNOSIS — Z951 Presence of aortocoronary bypass graft: Secondary | ICD-10-CM

## 2020-07-26 DIAGNOSIS — R0602 Shortness of breath: Secondary | ICD-10-CM | POA: Diagnosis not present

## 2020-07-26 NOTE — Progress Notes (Signed)
      [address]       [place] [address]             [phone]        [place] Medical Record #[id number] Date of Birth: Nov 04, 1965  Referring: Iran Ouch, MD Primary Care: Patient, No Pcp Per Primary Cardiologist:Muhammad Kirke Corin, MD  Reason for visit:   follow-up  History of Present Illness:     Overall doing well.  Complains of chest wall pain.    Physical Exam: BP 119/78   Pulse 87   Temp 97.6 F (36.4 C) (Skin)   Resp 20   Ht 5\' 6"  (1.676 m)   Wt 216 lb (98 kg)   SpO2 97% Comment: RA  BMI 34.86 kg/m   Alert NAD Incision clean.  Sternum stable Abdomen soft, ND no peripheral edema   Diagnostic Studies & Laboratory data: CXR: clear     Assessment / Plan:   55 yo male s/p CABG.  Doing well. Clear for cardiac rehab Follow-up prn   53 07/26/2020 4:24 PM

## 2020-07-30 ENCOUNTER — Other Ambulatory Visit: Payer: Self-pay | Admitting: Physician Assistant

## 2020-07-31 ENCOUNTER — Other Ambulatory Visit: Payer: Self-pay

## 2020-07-31 ENCOUNTER — Encounter: Payer: BLUE CROSS/BLUE SHIELD | Attending: Cardiovascular Disease | Admitting: *Deleted

## 2020-07-31 DIAGNOSIS — Z951 Presence of aortocoronary bypass graft: Secondary | ICD-10-CM

## 2020-07-31 NOTE — Progress Notes (Signed)
Initial telephone orientation completed. Diagnosis can be found in [facility] 12/24. EP orientation scheduled for Tuesday 3/22 at 10am

## 2020-08-15 ENCOUNTER — Ambulatory Visit: Payer: BLUE CROSS/BLUE SHIELD | Admitting: Cardiovascular Disease

## 2020-08-16 ENCOUNTER — Encounter: Payer: Self-pay | Admitting: Cardiovascular Disease

## 2020-10-03 ENCOUNTER — Inpatient Hospital Stay
Admission: EM | Admit: 2020-10-03 | Discharge: 2020-10-05 | DRG: 065 | Disposition: A | Discharge status: Home / Self Care | Payer: BLUE CROSS/BLUE SHIELD | Attending: Internal Medicine | Admitting: Internal Medicine

## 2020-10-03 ENCOUNTER — Encounter: Payer: Self-pay | Admitting: *Deleted

## 2020-10-03 ENCOUNTER — Emergency Department: Payer: BLUE CROSS/BLUE SHIELD

## 2020-10-03 ENCOUNTER — Other Ambulatory Visit: Payer: Self-pay

## 2020-10-03 DIAGNOSIS — R1110 Vomiting, unspecified: Secondary | ICD-10-CM | POA: Diagnosis not present

## 2020-10-03 DIAGNOSIS — I639 Cerebral infarction, unspecified: Secondary | ICD-10-CM

## 2020-10-03 DIAGNOSIS — I6782 Cerebral ischemia: Secondary | ICD-10-CM | POA: Diagnosis not present

## 2020-10-03 DIAGNOSIS — J341 Cyst and mucocele of nose and nasal sinus: Secondary | ICD-10-CM | POA: Diagnosis not present

## 2020-10-03 DIAGNOSIS — E119 Type 2 diabetes mellitus without complications: Secondary | ICD-10-CM | POA: Diagnosis present

## 2020-10-03 DIAGNOSIS — E860 Dehydration: Secondary | ICD-10-CM | POA: Diagnosis not present

## 2020-10-03 DIAGNOSIS — Z79899 Other long term (current) drug therapy: Secondary | ICD-10-CM | POA: Diagnosis not present

## 2020-10-03 DIAGNOSIS — A419 Sepsis, unspecified organism: Secondary | ICD-10-CM | POA: Diagnosis not present

## 2020-10-03 DIAGNOSIS — E872 Acidosis, unspecified: Secondary | ICD-10-CM | POA: Diagnosis present

## 2020-10-03 DIAGNOSIS — R4702 Dysphasia: Secondary | ICD-10-CM | POA: Diagnosis present

## 2020-10-03 DIAGNOSIS — E785 Hyperlipidemia, unspecified: Secondary | ICD-10-CM | POA: Diagnosis not present

## 2020-10-03 DIAGNOSIS — F411 Generalized anxiety disorder: Secondary | ICD-10-CM | POA: Diagnosis present

## 2020-10-03 DIAGNOSIS — J3489 Other specified disorders of nose and nasal sinuses: Secondary | ICD-10-CM | POA: Diagnosis not present

## 2020-10-03 DIAGNOSIS — I2510 Atherosclerotic heart disease of native coronary artery without angina pectoris: Secondary | ICD-10-CM | POA: Diagnosis present

## 2020-10-03 DIAGNOSIS — D72829 Elevated white blood cell count, unspecified: Secondary | ICD-10-CM | POA: Diagnosis not present

## 2020-10-03 DIAGNOSIS — R9082 White matter disease, unspecified: Secondary | ICD-10-CM | POA: Diagnosis not present

## 2020-10-03 DIAGNOSIS — I69322 Dysarthria following cerebral infarction: Secondary | ICD-10-CM | POA: Diagnosis not present

## 2020-10-03 DIAGNOSIS — I5042 Chronic combined systolic (congestive) and diastolic (congestive) heart failure: Secondary | ICD-10-CM | POA: Diagnosis not present

## 2020-10-03 DIAGNOSIS — Z20822 Contact with and (suspected) exposure to covid-19: Secondary | ICD-10-CM | POA: Diagnosis present

## 2020-10-03 DIAGNOSIS — I6359 Cerebral infarction due to unspecified occlusion or stenosis of other cerebral artery: Secondary | ICD-10-CM | POA: Diagnosis not present

## 2020-10-03 DIAGNOSIS — R000 Tachycardia, unspecified: Secondary | ICD-10-CM | POA: Diagnosis not present

## 2020-10-03 DIAGNOSIS — I252 Old myocardial infarction: Secondary | ICD-10-CM | POA: Diagnosis not present

## 2020-10-03 DIAGNOSIS — R112 Nausea with vomiting, unspecified: Secondary | ICD-10-CM | POA: Diagnosis not present

## 2020-10-03 DIAGNOSIS — Z8249 Family history of ischemic heart disease and other diseases of the circulatory system: Secondary | ICD-10-CM

## 2020-10-03 DIAGNOSIS — I6602 Occlusion and stenosis of left middle cerebral artery: Secondary | ICD-10-CM | POA: Diagnosis not present

## 2020-10-03 DIAGNOSIS — F17210 Nicotine dependence, cigarettes, uncomplicated: Secondary | ICD-10-CM | POA: Diagnosis not present

## 2020-10-03 DIAGNOSIS — R579 Shock, unspecified: Secondary | ICD-10-CM

## 2020-10-03 DIAGNOSIS — I6523 Occlusion and stenosis of bilateral carotid arteries: Secondary | ICD-10-CM | POA: Diagnosis not present

## 2020-10-03 DIAGNOSIS — J9811 Atelectasis: Secondary | ICD-10-CM | POA: Diagnosis not present

## 2020-10-03 DIAGNOSIS — G9340 Encephalopathy, unspecified: Secondary | ICD-10-CM | POA: Diagnosis not present

## 2020-10-03 DIAGNOSIS — I110 Hypertensive heart disease with heart failure: Secondary | ICD-10-CM | POA: Diagnosis present

## 2020-10-03 DIAGNOSIS — I63512 Cerebral infarction due to unspecified occlusion or stenosis of left middle cerebral artery: Secondary | ICD-10-CM | POA: Diagnosis not present

## 2020-10-03 DIAGNOSIS — Z951 Presence of aortocoronary bypass graft: Secondary | ICD-10-CM

## 2020-10-03 DIAGNOSIS — Z7982 Long term (current) use of aspirin: Secondary | ICD-10-CM | POA: Diagnosis not present

## 2020-10-03 DIAGNOSIS — R0789 Other chest pain: Secondary | ICD-10-CM | POA: Diagnosis not present

## 2020-10-03 DIAGNOSIS — R4701 Aphasia: Secondary | ICD-10-CM | POA: Diagnosis not present

## 2020-10-03 DIAGNOSIS — R29810 Facial weakness: Secondary | ICD-10-CM | POA: Diagnosis not present

## 2020-10-03 DIAGNOSIS — R079 Chest pain, unspecified: Secondary | ICD-10-CM | POA: Diagnosis not present

## 2020-10-03 DIAGNOSIS — F419 Anxiety disorder, unspecified: Secondary | ICD-10-CM | POA: Diagnosis not present

## 2020-10-03 DIAGNOSIS — R29705 NIHSS score 5: Secondary | ICD-10-CM | POA: Diagnosis not present

## 2020-10-03 DIAGNOSIS — R4182 Altered mental status, unspecified: Secondary | ICD-10-CM | POA: Diagnosis not present

## 2020-10-03 DIAGNOSIS — R410 Disorientation, unspecified: Secondary | ICD-10-CM | POA: Diagnosis not present

## 2020-10-03 HISTORY — DX: Cerebral infarction, unspecified: I63.9

## 2020-10-03 LAB — COMPREHENSIVE METABOLIC PANEL
ALT: 22 U/L (ref 0–44)
AST: 41 U/L (ref 15–41)
Albumin: 4.4 g/dL (ref 3.5–5.0)
Alkaline Phosphatase: 61 U/L (ref 38–126)
Anion gap: 13 (ref 5–15)
BUN: 14 mg/dL (ref 6–20)
CO2: 24 mmol/L (ref 22–32)
Calcium: 9.8 mg/dL (ref 8.9–10.3)
Chloride: 102 mmol/L (ref 98–111)
Creatinine, Ser: 0.92 mg/dL (ref 0.61–1.24)
GFR, Estimated: >60 mL/min (ref >60)
Glucose, Bld: 142 mg/dL — ABNORMAL HIGH (ref 70–99)
Potassium: 4.9 mmol/L (ref 3.5–5.1)
Sodium: 139 mmol/L (ref 135–145)
Total Bilirubin: 0.5 mg/dL (ref 0.3–1.2)
Total Protein: 8.3 g/dL — ABNORMAL HIGH (ref 6.5–8.1)

## 2020-10-03 LAB — TROPONIN I (HIGH SENSITIVITY)
Troponin I (High Sensitivity): 12 ng/L (ref <18)
Troponin I (High Sensitivity): 14 ng/L (ref <18)

## 2020-10-03 LAB — CBC WITH DIFFERENTIAL/PLATELET
Abs Immature Granulocytes: 0.15 10*3/uL — ABNORMAL HIGH (ref 0.00–0.07)
Basophils Absolute: 0.1 10*3/uL (ref 0.0–0.1)
Basophils Relative: 0 %
Eosinophils Absolute: 0 10*3/uL (ref 0.0–0.5)
Eosinophils Relative: 0 %
HCT: 45.9 % (ref 39.0–52.0)
Hemoglobin: 15 g/dL (ref 13.0–17.0)
Immature Granulocytes: 1 %
Lymphocytes Relative: 6 %
Lymphs Abs: 1 10*3/uL (ref 0.7–4.0)
MCH: 30.5 pg (ref 26.0–34.0)
MCHC: 32.7 g/dL (ref 30.0–36.0)
MCV: 93.5 fL (ref 80.0–100.0)
Monocytes Absolute: 0.3 10*3/uL (ref 0.1–1.0)
Monocytes Relative: 2 %
Neutro Abs: 15.3 10*3/uL — ABNORMAL HIGH (ref 1.7–7.7)
Neutrophils Relative %: 91 %
Platelets: 245 10*3/uL (ref 150–400)
RBC: 4.91 MIL/uL (ref 4.22–5.81)
RDW: 14.8 % (ref 11.5–15.5)
WBC: 16.8 10*3/uL — ABNORMAL HIGH (ref 4.0–10.5)
nRBC: 0 % (ref 0.0–0.2)

## 2020-10-03 LAB — URINALYSIS, COMPLETE (UACMP) WITH MICROSCOPIC
Bacteria, UA: NONE SEEN
Bilirubin Urine: NEGATIVE
Glucose, UA: NEGATIVE mg/dL
Hgb urine dipstick: NEGATIVE
Ketones, ur: 5 mg/dL — AB
Leukocytes,Ua: NEGATIVE
Nitrite: NEGATIVE
Protein, ur: NEGATIVE mg/dL
Specific Gravity, Urine: 1.021 (ref 1.005–1.030)
Squamous Epithelial / HPF: NONE SEEN (ref 0–5)
pH: 5 (ref 5.0–8.0)

## 2020-10-03 LAB — MAGNESIUM: Magnesium: 2 mg/dL (ref 1.7–2.4)

## 2020-10-03 LAB — RESP PANEL BY RT-PCR (FLU A&B, COVID) ARPGX2
Influenza A by PCR: NEGATIVE
Influenza B by PCR: NEGATIVE
SARS Coronavirus 2 by RT PCR: NEGATIVE

## 2020-10-03 LAB — ETHANOL: Alcohol, Ethyl (B): <10 mg/dL (ref <10)

## 2020-10-03 LAB — LACTIC ACID, PLASMA: Lactic Acid, Venous: 5.4 mmol/L (ref 0.5–1.9)

## 2020-10-03 LAB — TSH: TSH: 0.791 u[IU]/mL (ref 0.350–4.500)

## 2020-10-03 LAB — CK: Total CK: 80 U/L (ref 49–397)

## 2020-10-03 LAB — LIPASE, BLOOD: Lipase: 31 U/L (ref 11–51)

## 2020-10-03 MED ORDER — METRONIDAZOLE 500 MG/100ML IV SOLN
500.0000 mg | Freq: Once | INTRAVENOUS | Status: AC
  Administered 2020-10-04: 500 mg via INTRAVENOUS
  Filled 2020-10-03: qty 100

## 2020-10-03 MED ORDER — ACETAMINOPHEN 650 MG RE SUPP: 650.0000 mg | Freq: Four times a day (QID) | RECTAL | Status: DC | PRN

## 2020-10-03 MED ORDER — ACETAMINOPHEN 325 MG PO TABS: 650.0000 mg | ORAL_TABLET | Freq: Four times a day (QID) | ORAL | Status: DC | PRN

## 2020-10-03 MED ORDER — LORAZEPAM 2 MG/ML IJ SOLN
1.0000 mg | Freq: Once | INTRAMUSCULAR | Status: AC
  Administered 2020-10-03: 1 mg via INTRAVENOUS
  Filled 2020-10-03: qty 1

## 2020-10-03 MED ORDER — LACTATED RINGERS IV BOLUS
1000.0000 mL | Freq: Once | INTRAVENOUS | Status: AC
  Administered 2020-10-03: 1000 mL via INTRAVENOUS

## 2020-10-03 MED ORDER — SODIUM CHLORIDE 0.9 % IV SOLN: 1.0000 g | Freq: Once | INTRAVENOUS | Status: DC

## 2020-10-03 MED ORDER — LACTATED RINGERS IV SOLN: INTRAVENOUS | Status: AC

## 2020-10-03 MED ORDER — ONDANSETRON HCL 4 MG/2ML IJ SOLN
4.0000 mg | Freq: Once | INTRAMUSCULAR | Status: AC
  Administered 2020-10-03: 4 mg via INTRAVENOUS
  Filled 2020-10-03: qty 2

## 2020-10-03 MED ORDER — SODIUM CHLORIDE 0.9 % IV SOLN
2.0000 g | Freq: Once | INTRAVENOUS | Status: AC
  Administered 2020-10-03: 2 g via INTRAVENOUS
  Filled 2020-10-03: qty 2

## 2020-10-03 NOTE — ED Provider Notes (Addendum)
[facility] Emergency Department Provider Note  ____________________________________________   Event Date/Time   First MD Initiated Contact with Patient 10/03/20 2043     (approximate)  I have reviewed the triage vital signs and the nursing notes.   HISTORY  Chief Complaint Altered Mental Status and Emesis   HPI Matthew Tapia is a 55 y.o. male with a past medical history of CAD, CHF, HTN, HDL, DM, tobacco abuse and polysubstance abuse as well as anxiety who presents EMS from home after reportedly noted to be acting bizarrely by family earlier today.  Patient endorses thirst and some nausea and vomiting earlier today not sure why it still is worried about him.  Per EMS report family stated this is how he usually gets after uses meth or alcohol.  Patient adamantly denies using any other substances.  He denies any recent injuries or falls, headache, earache, sore throat, chest pain, cough, shortness of breath, abdominal pain, diarrhea, dysuria, rash, back pain extremity pain or any other associated symptoms today.         Past Medical History:  Diagnosis Date  . Anxiety   . CAD, multiple vessel   . Dyslipidemia, goal LDL below 70   . Former smoker    Quit 2021  . HFrEF (heart failure with reduced ejection fraction) (HCC)   . Polysubstance abuse (HCC)    UDS positive for cocaine, amphetamines, benzos 05/15/20  . ST elevation myocardial infarction (STEMI) of inferior wall [facility])     Patient Active Problem List   Diagnosis Date Noted  . 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 CAD (coronary artery disease) 05/17/2020  . HFrEF (heart failure with reduced ejection fraction) (HCC)   . CAD, 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  . Polysubstance abuse (HCC) 05/16/2020  . Cardiomyopathy, ischemic 05/16/2020  . Hyperlipidemia LDL goal <70 05/16/2020  . Essential hypertension 05/16/2020  . Elevated hemoglobin A1c 05/16/2020  . Acute ST elevation myocardial infarction (STEMI) of  inferior wall (HCC) 05/14/2020  . Tobacco abuse 05/14/2020  . Leukocytosis 05/14/2020  . Anxiety 05/14/2020  . Cough 05/14/2020    Past Surgical History:  Procedure Laterality Date  . CORONARY ANGIOGRAPHY N/A 05/20/2020   Procedure: CORONARY ANGIOGRAPHY;  Surgeon: Kathleene Hazel, MD;  Location: [facility] INVASIVE CV LAB;  Service: Cardiovascular;  Laterality: N/A;  . CORONARY ARTERY BYPASS GRAFT N/A 05/21/2020   Procedure: CORONARY ARTERY BYPASS GRAFTING TIMES FOUR USING LEFT INTERNAL MAMMARY ARTERY  AND ENDOSCOPICALLY HARVESTED RIGHT GREATER SAPHANOUS VEIN.;  Surgeon: Corliss Skains, MD;  Location: [facility] OR;  Service: Open Heart Surgery;  Laterality: N/A;  flow track  . CORONARY THROMBECTOMY N/A 05/20/2020   Procedure: Coronary Thrombectomy;  Surgeon: Kathleene Hazel, MD;  Location: [facility] INVASIVE CV LAB;  Service: Cardiovascular;  Laterality: N/A;  . CORONARY/GRAFT ACUTE MI REVASCULARIZATION N/A 05/14/2020   Procedure: Coronary/Graft Acute MI Revascularization;  Surgeon: Iran Ouch, MD;  Location: [facility] INVASIVE CV LAB;  Service: Cardiovascular;  Laterality: N/A;  . CORONARY/GRAFT ACUTE MI REVASCULARIZATION N/A 05/20/2020   Procedure: Coronary/Graft Acute MI Revascularization;  Surgeon: Kathleene Hazel, MD;  Location: [facility] INVASIVE CV LAB;  Service: Cardiovascular;  Laterality: N/A;  . ENDOVEIN HARVEST OF GREATER SAPHENOUS VEIN Right 05/21/2020   Procedure: ENDOVEIN HARVEST OF RIGHT GREATER SAPHENOUS VEIN;  Surgeon: Corliss Skains, MD;  Location: [facility] OR;  Service: Open Heart Surgery;  Laterality: Right;  . INTRAVASCULAR PRESSURE WIRE/FFR STUDY N/A 05/16/2020   Procedure: INTRAVASCULAR PRESSURE WIRE/FFR STUDY;  Surgeon:  Iran OuchArida, Muhammad A, MD;  Location: [facility] INVASIVE CV LAB;  Service: Cardiovascular;  Laterality: N/A;  . LEFT HEART CATH AND CORONARY ANGIOGRAPHY N/A 05/14/2020   Procedure: LEFT HEART CATH AND CORONARY ANGIOGRAPHY;  Surgeon: Iran OuchArida, Muhammad A, MD;   Location: [facility] INVASIVE CV LAB;  Service: Cardiovascular;  Laterality: N/A;  . LEFT HEART CATH AND CORONARY ANGIOGRAPHY N/A 05/16/2020   Procedure: LEFT HEART CATH AND CORONARY ANGIOGRAPHY;  Surgeon: Iran OuchArida, Muhammad A, MD;  Location: [facility] INVASIVE CV LAB;  Service: Cardiovascular;  Laterality: N/A;  . SHOULDER SURGERY    . TEE WITHOUT CARDIOVERSION N/A 05/21/2020   Procedure: TRANSESOPHAGEAL ECHOCARDIOGRAM (TEE);  Surgeon: Corliss SkainsLightfoot, Harrell O, MD;  [facility];  Service: Open Heart Surgery;  Laterality: N/A;    Prior to Admission medications   Medication Sig Start Date End Date Taking? Authorizing Provider  acetaminophen (TYLENOL) 325 MG tablet Take 650-975 mg by mouth every 6 (six) hours as needed for mild pain, fever or headache.    [provider]  aspirin EC 325 MG EC tablet Take 1 tablet (325 mg total) by mouth daily. 05/25/20   Ardelle BallsZimmerman, Donielle M, PA-C  atorvastatin (LIPITOR) 80 MG tablet Take 1 tablet (80 mg total) by mouth daily. 05/26/20   Doree FudgeZimmerman, Donielle M, PA-C  guaiFENesin-dextromethorphan (ROBITUSSIN DM) 100-10 MG/5ML syrup Take 5 mLs by mouth every 4 (four) hours as needed for cough. 05/25/20   Ardelle BallsZimmerman, Donielle M, PA-C  LORazepam (ATIVAN) 0.5 MG tablet Take 1 tablet (0.5 mg total) by mouth every 8 (eight) hours as needed for anxiety. 02/09/20 02/08/21  Jene EveryKinner, Robert, MD  metoprolol tartrate (LOPRESSOR) 25 MG tablet Take 1 tablet (25 mg total) by mouth 2 (two) times daily. 05/26/20   Ardelle BallsZimmerman, Donielle M, PA-C  sacubitril-valsartan (ENTRESTO) 24-26 MG Take 1 tablet by mouth 2 (two) times daily. 07/16/20   Furth, Cadence H, PA-C    Allergies Patient has no known allergies.  Family History  Problem Relation Age of Onset  . Heart attack Father     Social History Social History   Tobacco Use  . Smoking status: Current Some Day Smoker    Types: Cigarettes  . Smokeless tobacco: Never Used  . Tobacco comment: 1-2 cigarettes "every now and then"  Substance Use  Topics  . Alcohol use: No  . Drug use: Never    Review of Systems  Review of Systems  Constitutional: Negative for chills and fever.  HENT: Negative for sore throat.   Eyes: Negative for pain.  Respiratory: Negative for cough and stridor.   Cardiovascular: Negative for chest pain.  Gastrointestinal: Positive for nausea and vomiting.  Genitourinary: Negative for dysuria.  Musculoskeletal: Negative for myalgias.  Skin: Negative for rash.  Neurological: Negative for seizures, loss of consciousness and headaches.  Psychiatric/Behavioral: Negative for suicidal ideas.  All other systems reviewed and are negative.     ____________________________________________   PHYSICAL EXAM:  VITAL SIGNS: ED Triage Vitals  Enc Vitals Group     BP      Pulse      Resp      Temp      Temp src      SpO2      Weight      Height      Head Circumference      Peak Flow      Pain Score      Pain Loc      Pain Edu?      Excl. in GC?  Vitals:   10/03/20 2044 10/03/20 2315  BP:  130/73  Pulse: (!) 108 96  Resp: 12 (!) 21  Temp:  98.2 F (36.8 C)  SpO2: 97% 97%   Physical Exam Vitals and nursing note reviewed.  Constitutional:      Appearance: Normal appearance. He is well-developed and normal weight.  HENT:     Head: Normocephalic and atraumatic.     Right Ear: External ear normal.     Left Ear: External ear normal.     Nose: Nose normal.     Mouth/Throat:     Mouth: Mucous membranes are dry.  Eyes:     Conjunctiva/sclera: Conjunctivae normal.  Cardiovascular:     Rate and Rhythm: Regular rhythm. Tachycardia present.     Heart sounds: No murmur heard.   Pulmonary:     Effort: Pulmonary effort is normal. No respiratory distress.     Breath sounds: Normal breath sounds.  Abdominal:     Palpations: Abdomen is soft.     Tenderness: There is no abdominal tenderness.  Musculoskeletal:     Cervical back: Neck supple.  Skin:    General: Skin is warm and dry.      Capillary Refill: Capillary refill takes more than 3 seconds.  Neurological:     Mental Status: He is alert and oriented to person, place, and time.  Psychiatric:        Mood and Affect: Mood normal.     Cranial nerves II through XII grossly intact.  No pronator drift.  No finger dysmetria.  Symmetric 5/5 strength of all extremities.  Sensation intact to light touch in all extremities.  Patient is oriented to location and date but is somewhat repetitive when asked how he has been feeling last couple days her apartment to the emergency room stating "I do not know I do not know I do not know I do not know." ____________________________________________   LABS (all labs ordered are listed, but only abnormal results are displayed)  Labs Reviewed  CBC WITH DIFFERENTIAL/PLATELET - Abnormal; Notable for the following components:      Result Value   WBC 16.8 (*)    Neutro Abs 15.3 (*)    Abs Immature Granulocytes 0.15 (*)    All other components within normal limits  COMPREHENSIVE METABOLIC PANEL - Abnormal; Notable for the following components:   Glucose, Bld 142 (*)    Total Protein 8.3 (*)    All other components within normal limits  LACTIC ACID, PLASMA - Abnormal; Notable for the following components:   Lactic Acid, Venous 5.4 (*)    All other components within normal limits  URINALYSIS, COMPLETE (UACMP) WITH MICROSCOPIC - Abnormal; Notable for the following components:   Color, Urine YELLOW (*)    APPearance CLEAR (*)    Ketones, ur 5 (*)    All other components within normal limits  RESP PANEL BY RT-PCR (FLU A&B, COVID) ARPGX2  URINE CULTURE  CULTURE, BLOOD (SINGLE)  MAGNESIUM  TSH  ETHANOL  CK  LIPASE, BLOOD  ACETAMINOPHEN LEVEL  SALICYLATE LEVEL  PROTIME-INR  APTT  PROCALCITONIN  PROCALCITONIN  URINE DRUG SCREEN, QUALITATIVE ([facility] ONLY)  TROPONIN I (HIGH SENSITIVITY)  TROPONIN I (HIGH SENSITIVITY)   ____________________________________________  EKG  Sinus  tachycardia with ventricular rate of 108, Q waves in inferior leads and some nonspecific changes versus artifact in leads I and aVL without any other clearance of acute ischemia.  Unremarkable intervals. ____________________________________________  RADIOLOGY  ED MD interpretation: Chest  x-ray has no focal consolidation, effusion, significant edema pneumothorax but does have evidence of some right basilar atelectasis improved from prior.  CT head is unremarkable.  Official radiology report(s): DG Chest 2 View  Result Date: 10/03/2020 CLINICAL DATA:  Altered mental status.  Vomiting. EXAM: CHEST - 2 VIEW COMPARISON:  07/26/2020 FINDINGS: Post median sternotomy and CABG. Normal heart size. Normal mediastinal contours. No pulmonary edema, pleural effusion, or focal airspace disease. Improved right basilar atelectasis from prior. No pneumothorax. Air-fluid level noted in the stomach. No acute osseous abnormalities are seen. IMPRESSION: 1. No acute findings. Improved right basilar atelectasis. 2. Air-fluid level in the stomach, also seen on prior exams. Electronically Signed   By: Narda Rutherford M.D.   On: 10/03/2020 21:03   CT Head Wo Contrast  Result Date: 10/03/2020 CLINICAL DATA:  Altered mental status EXAM: CT HEAD WITHOUT CONTRAST TECHNIQUE: Contiguous axial images were obtained from the base of the skull through the vertex without intravenous contrast. COMPARISON:  CT brain 03/06/2010 FINDINGS: Brain: No acute territorial infarction, hemorrhage or intracranial mass. The ventricles are nonenlarged Vascular: No hyperdense vessel or unexpected calcification. Skull: Normal. Negative for fracture or focal lesion. Sinuses/Orbits: Patchy mucosal disease in the sinuses Other: None IMPRESSION: Negative non contrasted CT appearance of the brain Electronically Signed   By: Jasmine Pang M.D.   On: 10/03/2020 21:13    ____________________________________________   PROCEDURES  Procedure(s) performed  (including Critical Care):  Procedures   ____________________________________________   INITIAL IMPRESSION / ASSESSMENT AND PLAN / ED COURSE        Patient presents with above-stated history exam for assessment of some bizarre behavior and vomiting that started today.  On arrival he is tachycardic with otherwise stable vital signs on room air.  He has a nonfocal neuro exam although does appear dehydrated with very dry mucous membranes.  Differential includes possible CVA, SAH, ACS, arrhythmia, metabolic derangements, sympathomimetic ingestion, thyrotoxicosis, and possible acute infectious process.  No focal deficits on arrival to suggest CVA.  CT head shows no evidence of acute intracranial process.  Chest x-ray has no evidence of pneumothorax, pneumonia, effusion, edema or other clear acute thoracic process.  EKG has some nonspecific findings but given nonelevated troponins x2 I have a low suspicion for ACS or myocarditis..  No evidence of significant arrhythmia on ECG.  CMP remarkable for no significant electrolyte or metabolic derangements.  CBC has somewhat nonspecific leukocytosis with WBC count of 16.8 with no evidence of acute anemia.  Magnesium is 2.  TSH is WNL.  Ethanol undetectable.  CK is 80.  Lipase is 31.  Urinalysis has some ketones but no evidence of infection  Lactic acid elevated at 5.4. Unclear etiology for reported bizzare behavior earlier today. However, given he was have a leukocytosis and was tachycardic on arrival for we will treat for possible sepsis of unknown source with IV fluids broad-spectrum antibiotics and will obtain a blood and urine culture.   We will also send a UDS procalcitonin and COVID and flu.  Will plan to admit to medicine for further evaluation and management. ____________________________________________   FINAL CLINICAL IMPRESSION(S) / ED DIAGNOSES  Final diagnoses:  Dehydration  Shock (HCC)  Nausea and vomiting, intractability of  vomiting not specified, unspecified vomiting type  Sepsis, due to unspecified organism, unspecified whether acute organ dysfunction present (HCC)    Medications  ceFEPIme (MAXIPIME) 2 g in sodium chloride 0.9 % 100 mL IVPB (has no administration in time range)  lactated ringers infusion (  has no administration in time range)  metroNIDAZOLE (FLAGYL) IVPB 500 mg (has no administration in time range)  lactated ringers bolus 1,000 mL (0 mLs Intravenous Stopped 10/03/20 2243)  ondansetron (ZOFRAN) injection 4 mg (4 mg Intravenous Given 10/03/20 2126)  lactated ringers bolus 1,000 mL (1,000 mLs Intravenous New Bag/Given 10/03/20 2243)  LORazepam (ATIVAN) injection 1 mg (1 mg Intravenous Given 10/03/20 2318)     ED Discharge Orders    None       Note:  This document was prepared using Dragon voice recognition software and may include unintentional dictation errors.   Gilles Chiquito, MD 10/03/20 2336    Gilles Chiquito, MD 10/04/20 [phone]

## 2020-10-03 NOTE — ED Notes (Signed)
PT to X-ray

## 2020-10-03 NOTE — H&P (Addendum)
History and Physical    PLEASE NOTE THAT DRAGON DICTATION SOFTWARE WAS USED IN THE CONSTRUCTION OF THIS NOTE.   Matthew Tapia ZOX:[id number] DOB: May 05, 1966 DOA: 10/03/2020  PCP: Mechele Claude, FNP Patient coming from: home   I have personally briefly reviewed patient's old medical records in [place]  Chief Complaint: Altered mental status  HPI: Matthew Tapia is a 55 y.o. male with medical history significant for polysubstance abuse with history of cocaine and amphetamine abuse, chronic combined systolic/diastolic heart failure, hyperlipidemia, coronary artery disease status post four-vessel CABG in December 2021, generalized anxiety disorder, who is admitted to [facility] on 10/03/2020 with acute encephalopathy after presenting from home to [facility] ED for evaluation of altered mental status.   The following history is provided by the patient, my discussions with the patient's daughter, who is present at bedside, my discussions with the emergency department physician, and via chart review.  Daughter reports that the patient started exhibiting evidence of confusion around 1800 on 10/03/2020.  To her, she felt that the patient was unsure as to his current location, representing a change from his baseline mental status.  She conveys that this confusion and diminished orientation are consistent with symptoms that the patient has previously exhibited when abusing cocaine and amphetamines.  However, the patient's daughter reports that she has been consistently asking the patient since she first noted his confusion around 1800 on 10/03/2020  if he had recently used any recreational drugs, and she reports that he has answered this question every time with either now or shaking his head in the negative.  Starting around 1800 on 10/03/2020, daughter is also noted the patient to be repeating the same word several times in a row, also representing a change from his baseline  language presentation.  He also vomited to 3 times over the course of the last day, with most recent episode occurring while still at home, and associated with nonbloody, nonbilious emesis.  Daughter notes that the patient has had very little food or water over the course of the last day.  She is not aware the patient has been experiencing any abdominal pain, diarrhea, melena, or hematochezia.  He is also not been complaining of any chest pain, shortness of breath, palpitations, diaphoresis.  No recent trauma or travel.  No recent peripheral edema or rash.  He has not been complaining of any recent subjective fever, chills, rigors, or generalized myalgias.  No known recent COVID-19 exposures, cough, or neck stiffness.  He denies any recent dysuria or gross hematuria.  The patient also denies any recent alcohol consumption and no history of alcohol abuse.  No history of seizures or recent generalized tonic-clonic activity, loss of bowel/bladder function, or tongue biting.  The patient denies any acute focal weakness, paresthesias, numbness, facial droop, acute change in vision, or vertigo.   Pressure review, urinary drug screen performed in December 2021 was positive for cocaine, amphetamines, and benzodiazepines.  He underwent echocardiogram on 05/15/2020 which was notable for LVEF 35 to 81%, grade 1 diastolic dysfunction, and multiple focal wall motion abnormalities.  He subsequently underwent CABG x 4, with repeat echocardiogram performed on 05/21/2020 reportedly demonstrated no significant change in the interval since CABG, including no significant improvement in aforementioned focal wall motion abnormalities.  It appears that he was previously prescribed Entresto and Lopressor, although it is unclear to me at this time if the patient is compliant in taking these medications.  He is not on  any scheduled diuretic medication at home.     ED Course:  Vital signs in the ED were notable for the following:  Tetramex 98.2; initial heart rate 108, which decreased to 96 following interval administration of IV fluids, as further quantified below; blood pressure 129/82-137/87; respiratory rate 20-21; oxygen saturation 97 to 99% on room air.  Labs were notable for the following: CMP was notable for the following: Sodium 139, bicarbonate 24, anion gap 13, BUN 14, creatinine 0.92 relative to baseline range of 0.7-0.9, with most recent prior serum creatinine data point noted to be 0.71 on 05/24/2020, glucose 142, liver enzymes were found to be within normal limits.  CPK 80; high-sensitivity troponin I x2 found to be 12 followed by 14.  TSH 0.791, serum ethanol less than 10.  Urine drug screen found to be pan negative.  CBC notable for white blood cell count of 16,800.  Initial lactic acid 5.4.  Urinalysis showed no white blood cells, no bacteria, nitrate negative, leukocyte Estrace negative, 5 ketones, and specific gravity 1.021.  Nasopharyngeal COVID-19/influenza PCR was performed in the emergency department this evening and found to be negative.    2 view CXR showed no evidence of acute cardiopulmonary process, including no evidence of infiltrate, edema, pleural effusion, or pneumothorax.  Noncontrast CT of the head showed no evidence of acute intra cranial process, including no evidence of intracranial hemorrhage or acute infarct.  While in the ED, the following were administered: Zofran 4 mg IV x1, cefepime 2 g IV x1, Flagyl IV x1 dose, lactated Ringer's x2 L bolus followed by transition to continuous LR at 150 cc/h.  Additionally, in preparation for noncontrast CT of the head, the patient received Ativan 1 mg IV x1.  Subsequently, the patient was admitted to the med telemetry floor for further evaluation and management of presenting acute encephalopathy.     Review of Systems: As per HPI otherwise 10 point review of systems negative.   Past Medical History:  Diagnosis Date  . Anxiety   . CAD, multiple vessel    . Dyslipidemia, goal LDL below 70   . Former smoker    Quit 2021  . HFrEF (heart failure with reduced ejection fraction) ([place])   . Polysubstance abuse ([place])    UDS positive for cocaine, amphetamines, benzos 05/15/20  . ST elevation myocardial infarction (STEMI) of inferior wall [facility])     Past Surgical History:  Procedure Laterality Date  . CORONARY ANGIOGRAPHY N/A 05/20/2020   Procedure: CORONARY ANGIOGRAPHY;  Surgeon: Burnell Blanks, MD;  Location: [place] [facility];  Service: Cardiovascular;  Laterality: N/A;  . CORONARY ARTERY BYPASS GRAFT N/A 05/21/2020   Procedure: CORONARY ARTERY BYPASS GRAFTING TIMES FOUR USING LEFT INTERNAL MAMMARY ARTERY  AND ENDOSCOPICALLY HARVESTED RIGHT GREATER SAPHANOUS VEIN.;  Surgeon: Lajuana Matte, MD;  Location: [place];  Service: Open Heart Surgery;  Laterality: N/A;  flow track  . CORONARY THROMBECTOMY N/A 05/20/2020   Procedure: Coronary Thrombectomy;  Surgeon: Burnell Blanks, MD;  Location: [place] [facility];  Service: Cardiovascular;  Laterality: N/A;  . CORONARY/GRAFT ACUTE MI REVASCULARIZATION N/A 05/14/2020   Procedure: Coronary/Graft Acute MI Revascularization;  Surgeon: Wellington Hampshire, MD;  Location: [place] [facility];  Service: Cardiovascular;  Laterality: N/A;  . CORONARY/GRAFT ACUTE MI REVASCULARIZATION N/A 05/20/2020   Procedure: Coronary/Graft Acute MI Revascularization;  Surgeon: Burnell Blanks, MD;  Location: [place] [facility];  Service: Cardiovascular;  Laterality: N/A;  . ENDOVEIN HARVEST OF GREATER SAPHENOUS  VEIN Right 05/21/2020   Procedure: ENDOVEIN HARVEST OF RIGHT GREATER SAPHENOUS VEIN;  Surgeon: Lajuana Matte, MD;  Location: [place];  Service: Open Heart Surgery;  Laterality: Right;  . INTRAVASCULAR PRESSURE WIRE/FFR STUDY N/A 05/16/2020   Procedure: INTRAVASCULAR PRESSURE WIRE/FFR STUDY;  Surgeon: Wellington Hampshire, MD;  Location: [place] [facility];  Service: Cardiovascular;   Laterality: N/A;  . LEFT HEART CATH AND CORONARY ANGIOGRAPHY N/A 05/14/2020   Procedure: LEFT HEART CATH AND CORONARY ANGIOGRAPHY;  Surgeon: Wellington Hampshire, MD;  Location: [place] [facility];  Service: Cardiovascular;  Laterality: N/A;  . LEFT HEART CATH AND CORONARY ANGIOGRAPHY N/A 05/16/2020   Procedure: LEFT HEART CATH AND CORONARY ANGIOGRAPHY;  Surgeon: Wellington Hampshire, MD;  Location: [place] [facility];  Service: Cardiovascular;  Laterality: N/A;  . SHOULDER SURGERY    . TEE WITHOUT CARDIOVERSION N/A 05/21/2020   Procedure: TRANSESOPHAGEAL ECHOCARDIOGRAM (TEE);  Surgeon: Lajuana Matte, MD;  Location: [place];  Service: Open Heart Surgery;  Laterality: N/A;    Social History:  reports that he has been smoking cigarettes. He has never used smokeless tobacco. He reports that he does not drink alcohol and does not use drugs.   No Known Allergies  Family History  Problem Relation Age of Onset  . Heart attack Father      Prior to Admission medications   Medication Sig Start Date End Date Taking? Authorizing Provider  acetaminophen (TYLENOL) 325 MG tablet Take 650-975 mg by mouth every 6 (six) hours as needed for mild pain, fever or headache.    [provider]  aspirin EC 325 MG EC tablet Take 1 tablet (325 mg total) by mouth daily. 05/25/20   Nani Skillern, PA-C  atorvastatin (LIPITOR) 80 MG tablet Take 1 tablet (80 mg total) by mouth daily. 05/26/20   Lars Pinks M, PA-C  guaiFENesin-dextromethorphan (ROBITUSSIN DM) 100-10 MG/5ML syrup Take 5 mLs by mouth every 4 (four) hours as needed for cough. 05/25/20   Nani Skillern, PA-C  LORazepam (ATIVAN) 0.5 MG tablet Take 1 tablet (0.5 mg total) by mouth every 8 (eight) hours as needed for anxiety. 02/09/20 02/08/21  Lavonia Drafts, MD  metoprolol tartrate (LOPRESSOR) 25 MG tablet Take 1 tablet (25 mg total) by mouth 2 (two) times daily. 05/26/20   Nani Skillern, PA-C  sacubitril-valsartan  (ENTRESTO) 24-26 MG Take 1 tablet by mouth 2 (two) times daily. 07/16/20   Kathlen Mody, Cadence H, PA-C     Objective    Physical Exam: Vitals:   10/03/20 2044 10/03/20 2045 10/03/20 2315  BP:   130/73  Pulse: (!) 108  96  Resp: 12  (!) 21  Temp:   98.2 F (36.8 C)  TempSrc: Oral  Oral  SpO2: 97%  97%  Weight:  98 kg   Height:  '5\' 6"'  (1.676 m)     General: appears to be stated age; alert, but not oriented to person, place, time Skin: warm, dry, no rash Head:  AT/[place] Mouth:  Oral mucosa membranes appear dry, normal dentition Neck: supple; trachea midline Heart:  RRR; did not appreciate any M/R/G Lungs: CTAB, did not appreciate any wheezes, rales, or rhonchi Abdomen: + BS; soft, ND, NT Vascular: 2+ pedal pulses b/l; 2+ radial pulses b/l Extremities: no peripheral edema, no muscle wasting Neuro: strength and sensation intact in upper and lower extremities b/l; cranial nerves II through XII grossly intact; language: expressive dysphasia (repeating same word several times in a row without  context to the preceding question posed to him); no facial droop; normal muscle tone.  No tremors.   Labs on Admission: I have personally reviewed following labs and imaging studies  CBC: Recent Labs  Lab 10/03/20 2051  WBC 16.8*  NEUTROABS 15.3*  HGB 15.0  HCT 45.9  MCV 93.5  PLT 623   Basic Metabolic Panel: Recent Labs  Lab 10/03/20 2051  NA 139  K 4.9  CL 102  CO2 24  GLUCOSE 142*  BUN 14  CREATININE 0.92  CALCIUM 9.8  MG 2.0   GFR: Estimated Creatinine Clearance: 98.3 mL/min (by C-G formula based on SCr of 0.92 mg/dL). Liver Function Tests: Recent Labs  Lab 10/03/20 2051  AST 41  ALT 22  ALKPHOS 61  BILITOT 0.5  PROT 8.3*  ALBUMIN 4.4   Recent Labs  Lab 10/03/20 2051  LIPASE 31   No results for input(s): AMMONIA in the last 168 hours. Coagulation Profile: No results for input(s): INR, PROTIME in the last 168 hours. Cardiac Enzymes: Recent Labs  Lab  10/03/20 2051  CKTOTAL 80   BNP (last 3 results) No results for input(s): PROBNP in the last 8760 hours. HbA1C: No results for input(s): HGBA1C in the last 72 hours. CBG: No results for input(s): GLUCAP in the last 168 hours. Lipid Profile: No results for input(s): CHOL, HDL, LDLCALC, TRIG, CHOLHDL, LDLDIRECT in the last 72 hours. Thyroid Function Tests: Recent Labs    10/03/20 2051  TSH 0.791   Anemia Panel: No results for input(s): VITAMINB12, FOLATE, FERRITIN, TIBC, IRON, RETICCTPCT in the last 72 hours. Urine analysis:    Component Value Date/Time   COLORURINE YELLOW (A) 10/03/2020 2232   APPEARANCEUR CLEAR (A) 10/03/2020 2232   LABSPEC 1.021 10/03/2020 2232   PHURINE 5.0 10/03/2020 [place] 10/03/2020 2232   HGBUR NEGATIVE 10/03/2020 2232   BILIRUBINUR NEGATIVE 10/03/2020 2232   KETONESUR 5 (A) 10/03/2020 2232   PROTEINUR NEGATIVE 10/03/2020 2232   NITRITE NEGATIVE 10/03/2020 2232   LEUKOCYTESUR NEGATIVE 10/03/2020 2232    Radiological Exams on Admission: DG Chest 2 View  Result Date: 10/03/2020 CLINICAL DATA:  Altered mental status.  Vomiting. EXAM: CHEST - 2 VIEW COMPARISON:  07/26/2020 FINDINGS: Post median sternotomy and CABG. Normal heart size. Normal mediastinal contours. No pulmonary edema, pleural effusion, or focal airspace disease. Improved right basilar atelectasis from prior. No pneumothorax. Air-fluid level noted in the stomach. No acute osseous abnormalities are seen. IMPRESSION: 1. No acute findings. Improved right basilar atelectasis. 2. Air-fluid level in the stomach, also seen on prior exams. Electronically Signed   By: Keith Rake M.D.   On: 10/03/2020 21:03   CT Head Wo Contrast  Result Date: 10/03/2020 CLINICAL DATA:  Altered mental status EXAM: CT HEAD WITHOUT CONTRAST TECHNIQUE: Contiguous axial images were obtained from the base of the skull through the vertex without intravenous contrast. COMPARISON:  CT brain 03/06/2010  FINDINGS: Brain: No acute territorial infarction, hemorrhage or intracranial mass. The ventricles are nonenlarged Vascular: No hyperdense vessel or unexpected calcification. Skull: Normal. Negative for fracture or focal lesion. Sinuses/Orbits: Patchy mucosal disease in the sinuses Other: None IMPRESSION: Negative non contrasted CT appearance of the brain Electronically Signed   By: Donavan Foil M.D.   On: 10/03/2020 21:13     Assessment/Plan   Matthew Tapia is a 55 y.o. male with medical history significant for polysubstance abuse with history of cocaine and amphetamine abuse, chronic combined systolic/diastolic heart failure, hyperlipidemia, coronary artery  disease status post four-vessel CABG in December 2021, generalized anxiety disorder, who is admitted to [facility] on 10/03/2020 with acute encephalopathy after presenting from home to [facility] ED for evaluation of altered mental status.    Principal Problem:   Acute encephalopathy Active Problems:   Leukocytosis   Hyperlipidemia LDL goal <70   Lactic acidosis   Dehydration   Chronic combined systolic and diastolic CHF (congestive heart failure) ([place])     #) Acute encephalopathy: Confusion with evidence suggestive of expressive dysphasia, starting around 1800 on 10/03/2020.  Initially presentation was concerning for acute toxic encephalopathy given the patient's history of cocaine and amphetamine abuse, with daughter reporting that the above presentation is very similar to the symptoms that the patient was previously exhibited when abusing the stimulant recreational drugs.  However, urinary drug screen found to be pan-negative.  It is possible, given the short half-life of these particular drugs, that the patient may recently abused the stimulants, and is now experiencing an element of withdrawal, particularly given apparent macrophagia, as the patient conveys significant hunger in spite of just consuming a whole sandwich  with chips 15 mins prior.  There may also be metabolic contributions to his presenting acute encephalopathy, including clinical and laboratory evidence to suggest dehydration in the setting of limited recent oral intake as well as to 3 episodes of nausea/vomiting over the course of the last day.  We will continue rehydration efforts, as further described liver, and monitor trend ensuing mental status.  Potential additional metabolic contribution from presenting lactic acidosis, as further described below, but in the absence of any overt underlying infectious process, including no evidence of UTI, chest x-ray showing no evidence of pneumonia, and negative COVID-19/influenza PCR, without meningismus signs to suggest underlying meningitis.  Otherwise, no significant contributory electrolyte abnormalities.  Additionally, CPK and TSH are found to be within normal limits, while serum ethanol is also nonelevated.  No evidence of increased anion gap metabolic acidosis given bicarbonate within normal limits as well as nonelevated anion gap to warrant further investigation for an osmolar gap at this time.  No clinical evidence to suggest seizures at this time.  In the setting of acute encephalopathy and evidence of expressive dysphagia, ddx also includes acute ischemic stroke.  Of note, noncontrast CT of the head performed this evening showed no evidence of acute intracranial process. Will pursue MRI brain at this time to further evaluate for underlying acute ischemic CVA.  Of note, given daughters report that the patient's last known normal was around 1800 on 10/03/20, he is now outside of the window for tPA administration should ensuing imaging suggest acute ischemic infarct.  Of note, the patient has passed his nursing bedside swallow screen. Will empirically resume his home full dose aspirin and high intensity atorvastatin at this time.   Plan: Work-up and management presenting lactic acidosis, including IV fluids, as  further detailed below, with repeat lactate level ordered at 0130 on 10/04/20.  Repeat CMP and CBC with differential in the morning.  Check ionized calcium level, VBG, and MMA.  Every 4 hours neurochecks have been ordered.  MRI of the brain, as above.  Monitor on telemetry.  Check ammonia level.  Given his history of abuse of stimulant class recreational drugs, will also order seizure precautions.  Inpatient pharmacy consult placed for assistance with accurate outpatient medication reconciliation.      #) Lactic acidosis: Presenting lactate noted to be 5.4.  Likely multifactorial in its elevation, with contribution from  dehydration, as further detailed below, as well as suspected starvation keto lactic acidosis given report of very limited oral intake over the course of the last day, with presence of ketones in the absence of hyperglycemia on presenting labs.  Differential also includes acute ischemic stroke, as further detailed above, prompting pursuit of MRI brain, as detailed above.  Seizures, while in the differential, felt to be less likely at this time.  While presentation is associated with leukocytosis and mild tachypnea, there is no evidence of underlying infection at this time.  Therefore, criteria is not met for sepsis at the present, and consequently there is no indication for administration of a 30 mL/kg IVF bolus at this time.  Of note, patient received a 2 L lactated Ringer bolus in the ED, with ensuing transition to continuous IV fluids at 150 cc/h.  We will reduce the rate of these continuous IV fluids slightly given his history of chronic combined heart failure, while attempting to maintain nephro protective effects of IV fluids in the setting of lactic acidosis given suspected residual degree of dehydration at this time.  Plan: Continue lactated Ringer's, as above.  Repeat lactate level at 0130.  In anticipation of this ensuing lactate level will remain elevated, I also ordered a repeat  lactate to be checked with the morning labs.  Monitor strict I's and O's and daily weights.  Check salicylate level.  Check INR to evaluate hepatic synthetic function.  Repeat CBC with differential in the morning as well as CMP at that time to assess for any interval development of anion gap metabolic acidosis.  MRI of the brain, as above.      #) Dehydration: In the setting of reported very little oral intake of food or water over the course of the last day, the patient exhibits physical exam with additional clinical evidence to suggest dehydration, including presence of dry oral mucous membranes as well as elevated specific gravity on presenting urinalysis.  Of note, presenting serum creatinine found to be within baseline range, as further quantified above.  Plan: Continuous lactated Ringer's, as above.  Monitor strict I's and O's Daily weights.  Repeat CMP in the morning.       #) Leukocytosis: Presenting white blood cell count mildly elevated at 16,000, with no evidence of underlying infectious process at this time, including no evidence of UTI in addition to chest x-ray showed no evidence of acute process as well as negative COVID-19/influenza PCR performed in the ED this evening.  Now clinical evidence to suggest meningitis at this time.  Suspect a degree of hemoconcentration given clinical evidence to suggest presenting dehydration, as above.  We will continue to monitor for development of evidence to suggest underlying infectious process, although there does not appear to be an indication for continuation of antibiotics given current lack of evidence of underlying infection.   Plan: IV fluids, as above.  Repeat CBC with differential in the morning.       #) Chronic combined systolic/diastolic heart failure: With most echocardiogram on 05/15/2020 showing LVEF 35 to 40% with grade 1 diastolic dysfunction and multiple focal wall motion abnormalities, with no ensuing improvement in  systolic function or focal wall motion abnormalities following interval four-vessel CABG performed leading up to repeat echocardiogram on 05/21/2020, as further detailed above.  Not on any prescribed scheduled diuretic medications at home.  The patient was previously prescribed Entresto as well as Lopressor, although it is unclear this prescription has been continued.  I have consulted  pharmacy to assist with clarification of the patient's home cardiac regimen.  No clinical or radiographic evidence to suggest acutely decompensated heart failure at this time.  Suspect a toxic associated contribution to the patient's history of chronic heart failure given his documented history of cocaine/amphetamine abuse.  Plan: Monitor strict I's and O's and daily weights.  Repeat CMP and serum magnesium levels in the morning.  Monitor on telemetry.  Inpatient pharmacy consult for assistance with accurate reconciliation of home med list.      #) Hyperlipidemia: On high intensity atorvastatin as an outpatient.  Plan: Continue home statin.      #) History of polysubstance abuse: Documented history of cocaine/amphetamine abuse, with positive urinary drug screen in December 2021.  Patient denies any recent use of recreational drugs, and presenting urinary drug screen found to be pan negative.  Plan: Counseled the patient on the importance of abstinence from recreational drug use.      DVT prophylaxis: SCDs Code Status: Full code Family Communication: The patient's case was discussed with his daughter, who was present at bedside Disposition Plan: Per Rounding Team Consults called: none  Admission status: Observation; med telemetry     Of note, this patient was added by me to the following Admit List/Treatment Team: armcadmits.      PLEASE NOTE THAT DRAGON DICTATION SOFTWARE WAS USED IN THE CONSTRUCTION OF THIS NOTE.   [facility] Pager [phone] From [place]  Otherwise, please contact night-coverage  [email or web address] Password [facility]   10/03/2020, 11:45 PM

## 2020-10-03 NOTE — ED Triage Notes (Signed)
Pt presents to ED from home via EMS with altered mental status per pts daughter and vomiting that started today. Per daughter pt has acted like this in the past after having done Meth. Pt denies drug use. Pt is alert and oriented x 4 upon assessment. Reports increased thirst multiple times throughout assessment.

## 2020-10-03 NOTE — Progress Notes (Signed)
PHARMACY -  BRIEF ANTIBIOTIC NOTE   Pharmacy has received consult(s) for Cefepime from an ED provider.  The patient's profile has been reviewed for ht/wt/allergies/indication/available labs.    One time order(s) placed for Cefepime 2 gm IV X 1.   Further antibiotics/pharmacy consults should be ordered by admitting physician if indicated.                       Thank you, Burr Soffer D 10/03/2020  11:30 PM

## 2020-10-03 NOTE — ED Notes (Signed)
Pt to CT

## 2020-10-04 ENCOUNTER — Inpatient Hospital Stay: Payer: BLUE CROSS/BLUE SHIELD

## 2020-10-04 ENCOUNTER — Encounter: Payer: Self-pay | Admitting: Internal Medicine

## 2020-10-04 ENCOUNTER — Observation Stay: Payer: BLUE CROSS/BLUE SHIELD

## 2020-10-04 ENCOUNTER — Observation Stay
Admit: 2020-10-04 | Discharge: 2020-10-04 | Disposition: A | Discharge status: Home / Self Care | Payer: BLUE CROSS/BLUE SHIELD | Attending: Neurology | Admitting: Neurology

## 2020-10-04 DIAGNOSIS — J3489 Other specified disorders of nose and nasal sinuses: Secondary | ICD-10-CM | POA: Diagnosis not present

## 2020-10-04 DIAGNOSIS — I252 Old myocardial infarction: Secondary | ICD-10-CM | POA: Diagnosis not present

## 2020-10-04 DIAGNOSIS — I639 Cerebral infarction, unspecified: Secondary | ICD-10-CM | POA: Diagnosis present

## 2020-10-04 DIAGNOSIS — Z79899 Other long term (current) drug therapy: Secondary | ICD-10-CM | POA: Diagnosis not present

## 2020-10-04 DIAGNOSIS — I5042 Chronic combined systolic (congestive) and diastolic (congestive) heart failure: Secondary | ICD-10-CM | POA: Diagnosis present

## 2020-10-04 DIAGNOSIS — Z8249 Family history of ischemic heart disease and other diseases of the circulatory system: Secondary | ICD-10-CM | POA: Diagnosis not present

## 2020-10-04 DIAGNOSIS — E872 Acidosis, unspecified: Secondary | ICD-10-CM | POA: Diagnosis present

## 2020-10-04 DIAGNOSIS — I6782 Cerebral ischemia: Secondary | ICD-10-CM | POA: Diagnosis not present

## 2020-10-04 DIAGNOSIS — E785 Hyperlipidemia, unspecified: Secondary | ICD-10-CM | POA: Diagnosis present

## 2020-10-04 DIAGNOSIS — I69322 Dysarthria following cerebral infarction: Secondary | ICD-10-CM | POA: Diagnosis not present

## 2020-10-04 DIAGNOSIS — R4702 Dysphasia: Secondary | ICD-10-CM | POA: Diagnosis present

## 2020-10-04 DIAGNOSIS — F17210 Nicotine dependence, cigarettes, uncomplicated: Secondary | ICD-10-CM | POA: Diagnosis present

## 2020-10-04 DIAGNOSIS — G9340 Encephalopathy, unspecified: Secondary | ICD-10-CM | POA: Diagnosis present

## 2020-10-04 DIAGNOSIS — E860 Dehydration: Secondary | ICD-10-CM | POA: Diagnosis present

## 2020-10-04 DIAGNOSIS — R29705 NIHSS score 5: Secondary | ICD-10-CM | POA: Diagnosis present

## 2020-10-04 DIAGNOSIS — R9082 White matter disease, unspecified: Secondary | ICD-10-CM | POA: Diagnosis not present

## 2020-10-04 DIAGNOSIS — F419 Anxiety disorder, unspecified: Secondary | ICD-10-CM | POA: Diagnosis present

## 2020-10-04 DIAGNOSIS — I110 Hypertensive heart disease with heart failure: Secondary | ICD-10-CM | POA: Diagnosis present

## 2020-10-04 DIAGNOSIS — I63512 Cerebral infarction due to unspecified occlusion or stenosis of left middle cerebral artery: Secondary | ICD-10-CM | POA: Diagnosis not present

## 2020-10-04 DIAGNOSIS — F411 Generalized anxiety disorder: Secondary | ICD-10-CM | POA: Diagnosis present

## 2020-10-04 DIAGNOSIS — Z20822 Contact with and (suspected) exposure to covid-19: Secondary | ICD-10-CM | POA: Diagnosis present

## 2020-10-04 DIAGNOSIS — I2510 Atherosclerotic heart disease of native coronary artery without angina pectoris: Secondary | ICD-10-CM | POA: Diagnosis present

## 2020-10-04 DIAGNOSIS — Z951 Presence of aortocoronary bypass graft: Secondary | ICD-10-CM | POA: Diagnosis not present

## 2020-10-04 DIAGNOSIS — R4701 Aphasia: Secondary | ICD-10-CM | POA: Diagnosis present

## 2020-10-04 DIAGNOSIS — E119 Type 2 diabetes mellitus without complications: Secondary | ICD-10-CM | POA: Diagnosis present

## 2020-10-04 DIAGNOSIS — Z7982 Long term (current) use of aspirin: Secondary | ICD-10-CM | POA: Diagnosis not present

## 2020-10-04 LAB — ECHOCARDIOGRAM COMPLETE BUBBLE STUDY
AR max vel: 1.2 cm2
AV Area VTI: 1.03 cm2
AV Area mean vel: 1.25 cm2
AV Mean grad: 5 mmHg
AV Peak grad: 8.8 mmHg
Ao pk vel: 1.49 m/s
Area-P 1/2: 4.15 cm2
S' Lateral: 3.87 cm

## 2020-10-04 LAB — COMPREHENSIVE METABOLIC PANEL
ALT: 18 U/L (ref 0–44)
AST: 26 U/L (ref 15–41)
Albumin: 3.5 g/dL (ref 3.5–5.0)
Alkaline Phosphatase: 51 U/L (ref 38–126)
Anion gap: 9 (ref 5–15)
BUN: 14 mg/dL (ref 6–20)
CO2: 26 mmol/L (ref 22–32)
Calcium: 9.3 mg/dL (ref 8.9–10.3)
Chloride: 102 mmol/L (ref 98–111)
Creatinine, Ser: 0.74 mg/dL (ref 0.61–1.24)
GFR, Estimated: >60 mL/min (ref >60)
Glucose, Bld: 130 mg/dL — ABNORMAL HIGH (ref 70–99)
Potassium: 4 mmol/L (ref 3.5–5.1)
Sodium: 137 mmol/L (ref 135–145)
Total Bilirubin: 0.5 mg/dL (ref 0.3–1.2)
Total Protein: 6.9 g/dL (ref 6.5–8.1)

## 2020-10-04 LAB — VITAMIN B12: Vitamin B-12: 242 pg/mL (ref 180–914)

## 2020-10-04 LAB — CBC WITH DIFFERENTIAL/PLATELET
Abs Immature Granulocytes: 0.09 10*3/uL — ABNORMAL HIGH (ref 0.00–0.07)
Basophils Absolute: 0.1 10*3/uL (ref 0.0–0.1)
Basophils Relative: 0 %
Eosinophils Absolute: 0 10*3/uL (ref 0.0–0.5)
Eosinophils Relative: 0 %
HCT: 39.6 % (ref 39.0–52.0)
Hemoglobin: 13.2 g/dL (ref 13.0–17.0)
Immature Granulocytes: 1 %
Lymphocytes Relative: 20 %
Lymphs Abs: 2.9 10*3/uL (ref 0.7–4.0)
MCH: 30.4 pg (ref 26.0–34.0)
MCHC: 33.3 g/dL (ref 30.0–36.0)
MCV: 91.2 fL (ref 80.0–100.0)
Monocytes Absolute: 1 10*3/uL (ref 0.1–1.0)
Monocytes Relative: 7 %
Neutro Abs: 10.3 10*3/uL — ABNORMAL HIGH (ref 1.7–7.7)
Neutrophils Relative %: 72 %
Platelets: 234 10*3/uL (ref 150–400)
RBC: 4.34 MIL/uL (ref 4.22–5.81)
RDW: 14.9 % (ref 11.5–15.5)
WBC: 14.3 10*3/uL — ABNORMAL HIGH (ref 4.0–10.5)
nRBC: 0 % (ref 0.0–0.2)

## 2020-10-04 LAB — AMMONIA: Ammonia: 25 umol/L (ref 9–35)

## 2020-10-04 LAB — HEMOGLOBIN A1C
Hgb A1c MFr Bld: 6.5 % — ABNORMAL HIGH (ref 4.8–5.6)
Mean Plasma Glucose: 139.85 mg/dL

## 2020-10-04 LAB — BLOOD GAS, VENOUS
Acid-Base Excess: 2 mmol/L (ref 0.0–2.0)
Bicarbonate: 27.8 mmol/L (ref 20.0–28.0)
O2 Saturation: 94.8 %
Patient temperature: 37
pCO2, Ven: 47 mmHg (ref 44.0–60.0)
pH, Ven: 7.38 (ref 7.250–7.430)
pO2, Ven: 76 mmHg — ABNORMAL HIGH (ref 32.0–45.0)

## 2020-10-04 LAB — URINE DRUG SCREEN, QUALITATIVE (ARMC ONLY)
Amphetamines, Ur Screen: NOT DETECTED
Barbiturates, Ur Screen: NOT DETECTED
Benzodiazepine, Ur Scrn: NOT DETECTED
Cannabinoid 50 Ng, Ur ~~LOC~~: NOT DETECTED
Cocaine Metabolite,Ur ~~LOC~~: NOT DETECTED
MDMA (Ecstasy)Ur Screen: NOT DETECTED
Methadone Scn, Ur: NOT DETECTED
Opiate, Ur Screen: NOT DETECTED
Phencyclidine (PCP) Ur S: NOT DETECTED
Tricyclic, Ur Screen: NOT DETECTED

## 2020-10-04 LAB — LDL CHOLESTEROL, DIRECT: Direct LDL: 139.3 mg/dL — ABNORMAL HIGH (ref 0–99)

## 2020-10-04 LAB — LACTIC ACID, PLASMA
Lactic Acid, Venous: 1.9 mmol/L (ref 0.5–1.9)
Lactic Acid, Venous: 1.9 mmol/L (ref 0.5–1.9)

## 2020-10-04 LAB — PROCALCITONIN: Procalcitonin: <0.1 ng/mL

## 2020-10-04 LAB — APTT: aPTT: 27 seconds (ref 24–36)

## 2020-10-04 LAB — PROTIME-INR
INR: 1 (ref 0.8–1.2)
Prothrombin Time: 13.2 seconds (ref 11.4–15.2)

## 2020-10-04 LAB — ACETAMINOPHEN LEVEL: Acetaminophen (Tylenol), Serum: <10 ug/mL — ABNORMAL LOW (ref 10–30)

## 2020-10-04 LAB — MAGNESIUM: Magnesium: 2.2 mg/dL (ref 1.7–2.4)

## 2020-10-04 LAB — SALICYLATE LEVEL: Salicylate Lvl: <7 mg/dL — ABNORMAL LOW (ref 7.0–30.0)

## 2020-10-04 MED ORDER — IOHEXOL 350 MG/ML SOLN
75.0000 mL | Freq: Once | INTRAVENOUS | Status: AC | PRN
  Administered 2020-10-04: 75 mL via INTRAVENOUS

## 2020-10-04 MED ORDER — ATORVASTATIN CALCIUM 20 MG PO TABS
80.0000 mg | ORAL_TABLET | Freq: Every day | ORAL | Status: DC
  Administered 2020-10-04 – 2020-10-05 (×3): 80 mg via ORAL
  Filled 2020-10-04 (×3): qty 4

## 2020-10-04 MED ORDER — LORAZEPAM 2 MG/ML IJ SOLN
0.5000 mg | Freq: Four times a day (QID) | INTRAMUSCULAR | Status: DC | PRN
  Administered 2020-10-04 (×3): 0.5 mg via INTRAVENOUS
  Filled 2020-10-04 (×3): qty 1

## 2020-10-04 MED ORDER — LORAZEPAM 2 MG/ML IJ SOLN: 0.5000 mg | Freq: Four times a day (QID) | INTRAMUSCULAR | Status: DC | PRN

## 2020-10-04 MED ORDER — THIAMINE HCL 100 MG PO TABS
100.0000 mg | ORAL_TABLET | Freq: Every day | ORAL | Status: DC
  Administered 2020-10-04 – 2020-10-05 (×2): 100 mg via ORAL
  Filled 2020-10-04 (×2): qty 1

## 2020-10-04 MED ORDER — THIAMINE HCL 100 MG/ML IJ SOLN: 100.0000 mg | Freq: Every day | INTRAMUSCULAR | Status: DC

## 2020-10-04 MED ORDER — ASPIRIN EC 325 MG PO TBEC
325.0000 mg | DELAYED_RELEASE_TABLET | Freq: Every day | ORAL | Status: DC
  Administered 2020-10-04 (×2): 325 mg via ORAL
  Filled 2020-10-04 (×2): qty 1

## 2020-10-04 MED ORDER — GADOBUTROL 1 MMOL/ML IV SOLN
10.0000 mL | Freq: Once | INTRAVENOUS | Status: AC | PRN
  Administered 2020-10-04: 13:00:00 10 mL via INTRAVENOUS

## 2020-10-04 MED ORDER — NICOTINE 14 MG/24HR TD PT24
14.0000 mg | MEDICATED_PATCH | TRANSDERMAL | Status: DC
  Administered 2020-10-04: 14 mg via TRANSDERMAL
  Filled 2020-10-04: qty 1

## 2020-10-04 MED ORDER — ASPIRIN 81 MG PO CHEW
81.0000 mg | CHEWABLE_TABLET | Freq: Every day | ORAL | Status: DC
  Administered 2020-10-05: 08:00:00 81 mg via ORAL
  Filled 2020-10-04: qty 1

## 2020-10-04 MED ORDER — ASPIRIN 300 MG RE SUPP: 300.0000 mg | Freq: Every day | RECTAL | Status: DC

## 2020-10-04 NOTE — Progress Notes (Signed)
Elink Code Sepsis Completion Note:  Pt LA came down from 5.4 to 1.9. Had BC drawn before ABX administered and received IVF replacement. Admitted to Med Surgical unit for further monitoring.   Alizay Bronkema DNP eLink RN [phone] AM

## 2020-10-04 NOTE — Progress Notes (Addendum)
PROGRESS NOTE    Matthew Tapia  JXB:[id number] DOB: 1966-03-11 DOA: 10/03/2020 PCP: Miki Kins, FNP    Brief Narrative:  Matthew Tapia is a 55 y.o. male with medical history significant for polysubstance abuse with history of cocaine and amphetamine abuse, chronic combined systolic/diastolic heart failure, hyperlipidemia, coronary artery disease status post four-vessel CABG in December 2021, generalized anxiety disorder, who is admitted to [facility] on 10/03/2020 with acute encephalopathy after presenting from home to [facility] ED for evaluation of altered mental status. Negative non contrasted CT appearance of the brain. MRI BRAIN 1. Patchy multifocal acute ischemic infarcts involving the posterior left temporoparietal region and left basal ganglia as above. Possible associated mild petechial hemorrhage without frank hemorrhagic transformation or mass effect. 2. Underlying mild chronic microvascular ischemic disease.   5/13-NEUROLOGY CONSULTED. PT STILL HAVING DIFFICULTY GETTING ALL HIS SENTENCES CLEARLY SAID.    Consultants:     Procedures: MRI, CT  Antimicrobials:       Subjective: He reports feeling better, but having trouble talking . Some words makes sense, but overall difficult to understand him.  Objective: Vitals:   10/04/20 0030 10/04/20 0129 10/04/20 0500 10/04/20 0535  BP: 125/73 132/78  116/74  Pulse: (!) 103 96  96  Resp: (!) 21 17  16   Temp:  98.6 F (37 C)  98.5 F (36.9 C)  TempSrc:      SpO2: 95% 96%  100%  Weight:   102.3 kg   Height:        Intake/Output Summary (Last 24 hours) at 10/04/2020 0752 Last data filed at 10/04/2020 0300 Gross per 24 hour  Intake 2316.75 ml  Output --  Net 2316.75 ml   Filed Weights   10/03/20 2045 10/04/20 0500  Weight: 98 kg 102.3 kg    Examination:  General exam: Appears calm and comfortable  Respiratory system: Clear to auscultation. Respiratory effort normal. Cardiovascular  system: S1 & S2 heard, RRR. No JVD, murmurs, rubs, gallops or clicks.  Gastrointestinal system: Abdomen is nondistended, soft and nontender. Normal bowel sounds heard. Central nervous system: awake, expressive aphagia, not much facial droop, moving extremities Extremities: Symmetric 5 x 5 power. Skin:  Psychiatry:  Mood & affect appropriate in current setting    Data Reviewed: I have personally reviewed following labs and imaging studies  CBC: Recent Labs  Lab 10/03/20 2051 10/04/20 0515  WBC 16.8* 14.3*  NEUTROABS 15.3* 10.3*  HGB 15.0 13.2  HCT 45.9 39.6  MCV 93.5 91.2  PLT 245 234   Basic Metabolic Panel: Recent Labs  Lab 10/03/20 2051 10/04/20 0515  NA 139 137  K 4.9 4.0  CL 102 102  CO2 24 26  GLUCOSE 142* 130*  BUN 14 14  CREATININE 0.92 0.74  CALCIUM 9.8 9.3  MG 2.0 2.2   GFR: Estimated Creatinine Clearance: 115.5 mL/min (by C-G formula based on SCr of 0.74 mg/dL). Liver Function Tests: Recent Labs  Lab 10/03/20 2051 10/04/20 0515  AST 41 26  ALT 22 18  ALKPHOS 61 51  BILITOT 0.5 0.5  PROT 8.3* 6.9  ALBUMIN 4.4 3.5   Recent Labs  Lab 10/03/20 2051  LIPASE 31   Recent Labs  Lab 10/04/20 0515  AMMONIA 25   Coagulation Profile: Recent Labs  Lab 10/04/20 0515  INR 1.0   Cardiac Enzymes: Recent Labs  Lab 10/03/20 2051  CKTOTAL 80   BNP (last 3 results) No results for input(s): PROBNP in the last 8760 hours.  HbA1C: No results for input(s): HGBA1C in the last 72 hours. CBG: No results for input(s): GLUCAP in the last 168 hours. Lipid Profile: No results for input(s): CHOL, HDL, LDLCALC, TRIG, CHOLHDL, LDLDIRECT in the last 72 hours. Thyroid Function Tests: Recent Labs    10/03/20 2051  TSH 0.791   Anemia Panel: No results for input(s): VITAMINB12, FOLATE, FERRITIN, TIBC, IRON, RETICCTPCT in the last 72 hours. Sepsis Labs: Recent Labs  Lab 10/03/20 2232 10/03/20 2239 10/04/20 0515  PROCALCITON  --  <0.10  --    LATICACIDVEN 5.4*  --  1.9    Recent Results (from the past 240 hour(s))  Resp Panel by RT-PCR (Flu A&B, Covid) Nasopharyngeal Swab     Status: None   Collection Time: 10/03/20 10:32 PM   Specimen: Nasopharyngeal Swab; Nasopharyngeal(NP) swabs in vial transport medium  Result Value Ref Range Status   SARS Coronavirus 2 by RT PCR NEGATIVE NEGATIVE Final    Comment: (NOTE) SARS-CoV-2 target nucleic acids are NOT DETECTED.  The SARS-CoV-2 RNA is generally detectable in upper respiratory specimens during the acute phase of infection. The lowest concentration of SARS-CoV-2 viral copies this assay can detect is 138 copies/mL. A negative result does not preclude SARS-Cov-2 infection and should not be used as the sole basis for treatment or other patient management decisions. A negative result may occur with  improper specimen collection/handling, submission of specimen other than nasopharyngeal swab, presence of viral mutation(s) within the areas targeted by this assay, and inadequate number of viral copies(<138 copies/mL). A negative result must be combined with clinical observations, patient history, and epidemiological information. The expected result is Negative.  Fact Sheet for Patients:  BloggerCourse.comhttps://www.fda.gov/media/152166/download  Fact Sheet for Healthcare Providers:  SeriousBroker.ithttps://www.fda.gov/media/152162/download  This test is no t yet approved or cleared by the [place] FDA and  has been authorized for detection and/or diagnosis of SARS-CoV-2 by FDA under an Emergency Use Authorization (EUA). This EUA will remain  in effect (meaning this test can be used) for the duration of the COVID-19 declaration under Section 564(b)(1) of the Act, 21 U.S.C.section 360bbb-3(b)(1), unless the authorization is terminated  or revoked sooner.       Influenza A by PCR NEGATIVE NEGATIVE Final   Influenza B by PCR NEGATIVE NEGATIVE Final    Comment: (NOTE) The Xpert Xpress SARS-CoV-2/FLU/RSV  plus assay is intended as an aid in the diagnosis of influenza from Nasopharyngeal swab specimens and should not be used as a sole basis for treatment. Nasal washings and aspirates are unacceptable for Xpert Xpress SARS-CoV-2/FLU/RSV testing.  Fact Sheet for Patients: BloggerCourse.comhttps://www.fda.gov/media/152166/download  Fact Sheet for Healthcare Providers: SeriousBroker.ithttps://www.fda.gov/media/152162/download  This test is not yet approved or cleared by the [place] FDA and has been authorized for detection and/or diagnosis of SARS-CoV-2 by FDA under an Emergency Use Authorization (EUA). This EUA will remain in effect (meaning this test can be used) for the duration of the COVID-19 declaration under Section 564(b)(1) of the Act, 21 U.S.C. section 360bbb-3(b)(1), unless the authorization is terminated or revoked.  Performed at [facility], [address] [place], [place] [address]   Culture, blood (single) w Reflex to ID Panel     Status: None (Preliminary result)   Collection Time: 10/04/20  5:15 AM   Specimen: BLOOD  Result Value Ref Range Status   Specimen Description BLOOD LEFT ANTECUBITAL  Final   Special Requests   Final    BOTTLES DRAWN AEROBIC AND ANAEROBIC Blood Culture results may not be optimal  due to an excessive volume of blood received in culture bottles   Culture   Final    NO GROWTH <12 HOURS Performed at [facility], [address] [place], [place] [address]    Report Status PENDING  Incomplete         Radiology Studies: DG Chest 2 View  Result Date: 10/03/2020 CLINICAL DATA:  Altered mental status.  Vomiting. EXAM: CHEST - 2 VIEW COMPARISON:  07/26/2020 FINDINGS: Post median sternotomy and CABG. Normal heart size. Normal mediastinal contours. No pulmonary edema, pleural effusion, or focal airspace disease. Improved right basilar atelectasis from prior. No pneumothorax. Air-fluid level noted in the stomach. No acute osseous abnormalities are seen.  IMPRESSION: 1. No acute findings. Improved right basilar atelectasis. 2. Air-fluid level in the stomach, also seen on prior exams. Electronically Signed   By: Narda Rutherford M.D.   On: 10/03/2020 21:03   CT Head Wo Contrast  Result Date: 10/03/2020 CLINICAL DATA:  Altered mental status EXAM: CT HEAD WITHOUT CONTRAST TECHNIQUE: Contiguous axial images were obtained from the base of the skull through the vertex without intravenous contrast. COMPARISON:  CT brain 03/06/2010 FINDINGS: Brain: No acute territorial infarction, hemorrhage or intracranial mass. The ventricles are nonenlarged Vascular: No hyperdense vessel or unexpected calcification. Skull: Normal. Negative for fracture or focal lesion. Sinuses/Orbits: Patchy mucosal disease in the sinuses Other: None IMPRESSION: Negative non contrasted CT appearance of the brain Electronically Signed   By: Jasmine Pang M.D.   On: 10/03/2020 21:13   MR BRAIN WO CONTRAST  Result Date: 10/04/2020 CLINICAL DATA:  Initial evaluation for neuro deficit, stroke suspected, expressive aphasia. EXAM: MRI HEAD WITHOUT CONTRAST TECHNIQUE: Multiplanar, multiecho pulse sequences of the brain and surrounding structures were obtained without intravenous contrast. COMPARISON:  Prior CT from 10/03/2020 FINDINGS: Brain: Examination technically limited by motion artifact and the patient's inability to tolerate the full length of the study. Cerebral volume within normal limits for age. Patchy T2/FLAIR hyperintensity noted involving the periventricular and deep white matter both cerebral hemispheres, most like related chronic microvascular ischemic disease, mild in nature. Patchy multifocal areas of restricted diffusion seen involving the cortical and subcortical aspects of the posterior left temporoparietal region. Area of involvement greatest at the level of the superior left temporal gyrus and measures approximately 3 cm in size (series 5, image 24). Mild patchy involvement of the  left caudate noted (series 5, image 29). Possible associated petechial hemorrhage without frank hemorrhagic transformation (series 13, image 31). No associated mass effect. No other evidence for acute or subacute ischemia. Gray-white matter differentiation otherwise maintained. No evidence for acute intracranial hemorrhage. No mass lesion, midline shift or mass effect. No hydrocephalus or extra-axial fluid collection. No made of a partially empty sella. Midline structures intact. Vascular: Major intracranial vascular flow voids are grossly maintained. Skull and upper cervical spine: Craniocervical junction within normal limits. Bone marrow signal intensity normal. No scalp soft tissue abnormality. Sinuses/Orbits: Globes and orbital soft tissues within normal limits. Mild scattered mucosal thickening noted within the ethmoidal air cells and maxillary sinuses. Paranasal sinuses are otherwise clear. No mastoid effusion. Inner ear structures grossly normal. Other: None. IMPRESSION: 1. Patchy multifocal acute ischemic infarcts involving the posterior left temporoparietal region and left basal ganglia as above. Possible associated mild petechial hemorrhage without frank hemorrhagic transformation or mass effect. 2. Underlying mild chronic microvascular ischemic disease. Electronically Signed   By: Rise Mu M.D.   On: 10/04/2020 03:33        Scheduled  Meds: . aspirin  325 mg Oral Daily  . atorvastatin  80 mg Oral Daily   Continuous Infusions: . lactated ringers 125 mL/hr at 10/04/20 0154    Assessment & Plan:   Principal Problem:   Acute encephalopathy Active Problems:   Leukocytosis   Hyperlipidemia LDL goal <70   Lactic acidosis   Dehydration   Chronic combined systolic and diastolic CHF (congestive heart failure) (HCC)   #) Acute encephalopathy/Acute ischemic infarcts Patient presented Confusion with evidence suggestive of expressive dysphasia CT head no acute abn. MRI brain  with patchy multifocal acute ischemic infarcts of post. Left temporoparietal region and Lt basal ganglia. Possible associated mild petechial hemorrhage without frank hemorrhagic transformation. Neurology consult Echo pending Neuro checks q4 PT/OT SPL Ck FLP Start statin Permissive hypertension       #) Lactic acidosis: Presenting lactate noted to be 5.4.  Likely multifactorial in its elevation, with contribution from dehydration, stroke, and   suspected starvation keto lactic acidosis given report of very limited oral intake over the course of the last day, with presence of ketones in the absence of hyperglycemia on presenting labs.   5/13-given ivf LA improved      #) Dehydration: Was hydrated with IVF     #) chronic Leukocytosis: afebrile No signs of infection montior    #) Chronic combined systolic/diastolic heart failure: With most echocardiogram on 05/15/2020 showing LVEF 35 to 40% with grade 1 diastolic dysfunction  Euvolemic on exam Monitor I/o  Daily wt Will as pharmacy to verify cardiac /chf med On statin, asa.   #) Hyperlipidemia: continue statin     #) History of polysubstance abuse: tox negative  Was counseled about substance abuse during this hospitalization   DVT prophylaxis: SCD Code Status:full Family Communication: daughter updated Disposition Plan: PT/OT Status is: Inpatient  Patient is inpatient as his illness severity requires inpatient hospitalization  Dispo: The patient is from: Home              Anticipated d/c is to: Home              Patient currently is not medically stable to d/c.   Difficult to place patient No            LOS: 0 days   Time spent: 45 min with >50% on coc    Lynn Ito, MD [facility] Pager [phone]  If 7PM-7AM, please contact night-coverage 10/04/2020, 7:52 AM

## 2020-10-04 NOTE — Progress Notes (Signed)
Message sent to bedside RN to send another LA, since the ordered 0130 LA order has not resulted as yet.

## 2020-10-04 NOTE — Progress Notes (Signed)
eeg done °

## 2020-10-04 NOTE — Progress Notes (Signed)
CODE SEPSIS - PHARMACY COMMUNICATION  **Broad Spectrum Antibiotics should be administered within 1 hour of Sepsis diagnosis**  Time Code Sepsis Called/Page Received: 5/12 @ 2359   Antibiotics Ordered: Cefepime 2 gm IV X 1   Time of 1st antibiotic administration:  5/12 @ 2359   Additional action taken by pharmacy:   If necessary, Name of Provider/Nurse Contacted:     Derk Doubek D ,PharmD Clinical Pharmacist  10/04/2020  12:14 AM

## 2020-10-04 NOTE — Progress Notes (Signed)
Code Sepsis initiated @ 2335 PM, ELINK following.

## 2020-10-04 NOTE — Consult Note (Signed)
NEUROLOGY CONSULTATION NOTE   Date of service: Oct 04, 2020 Patient Name: Matthew Tapia MRN:  [id number] DOB:  1965-06-04 Reason for consult: acute infarct on MRI _ _ _   _ __   _ __ _ _  __ __   _ __   __ _  History of Present Illness   Matthew Tapia is a 55 y.o. male with PMH significant for  has a past medical history of Anxiety, CAD, multiple vessel, Dyslipidemia, goal LDL below 70, Former smoker, HFrEF (heart failure with reduced ejection fraction) (HCC), Polysubstance abuse (HCC), and ST elevation myocardial infarction (STEMI) of inferior wall (HCC). who was brought to ED by daughter for altered mental status. About a day earlier he began acting strangely, appearing confused, not answering questions appropriately, with difficulty findings his words and inappropriate word substitutions. ED exam notable for aphasia and MRI brain revealed acute infarcts:  MRI brain wo 1. Patchy multifocal acute ischemic infarcts involving the posterior left temporoparietal region and left basal ganglia as above. Possible associated mild petechial hemorrhage without frank hemorrhagic transformation or mass effect. 2. Underlying mild chronic microvascular ischemic disease.  MRA H&N Impression: Intracranial MR angiography does not show any large vessel occlusion. One could question some narrowing of a distal left inferior division M3 branch.  MR angiography of the neck does not show a definite dissection. There is questionable mild smooth indentation along the medial wall of the midportion of the common carotid artery. Most often, this would be due to minor atherosclerotic plaque. Minor dissection of the common carotid artery can not be excluded using this technique. I am doubtful that this represents a dissection. If concern persists, CT angiography could be considered.  CNS imaging personally reviewed and I agree with the above interpretations. CTA ordered and is pending r/o dissection  Labwork  remarkable for WBC 16.8, CK 80, lactic acid 5.4 CMP, UA, TSH, ammonia WNL HIV NR, COVID neg, influenza neg UDS pan-neg  RPR, B12, MMA, LDL, A1c pending  A1c 6.5 LDL 139  TTE  1. Left ventricular ejection fraction, by estimation, is 40 to 45%. The left ventricle has normal function. The left ventricle demonstrates regional wall motion abnormalities (see scoring diagram/findings for description). Left ventricular diastolic parameters were normal. 2. Right ventricular systolic function is normal. The right ventricular size is normal. 3. The mitral valve is normal in structure. Mild to moderate mitral valve regurgitation. 4. The aortic valve is normal in structure. Aortic valve regurgitation is trivial.  rEEG pending    ROS   UTA 2/2 aphasia  Past History   Past Medical History:  Diagnosis Date  . Anxiety   . CAD, multiple vessel   . Dyslipidemia, goal LDL below 70   . Former smoker    Quit 2021  . HFrEF (heart failure with reduced ejection fraction) (HCC)   . Polysubstance abuse (HCC)    UDS positive for cocaine, amphetamines, benzos 05/15/20  . ST elevation myocardial infarction (STEMI) of inferior wall [facility])    Past Surgical History:  Procedure Laterality Date  . CORONARY ANGIOGRAPHY N/A 05/20/2020   Procedure: CORONARY ANGIOGRAPHY;  Surgeon: Kathleene Hazel, MD;  Location: [facility] INVASIVE CV LAB;  Service: Cardiovascular;  Laterality: N/A;  . CORONARY ARTERY BYPASS GRAFT N/A 05/21/2020   Procedure: CORONARY ARTERY BYPASS GRAFTING TIMES FOUR USING LEFT INTERNAL MAMMARY ARTERY  AND ENDOSCOPICALLY HARVESTED RIGHT GREATER SAPHANOUS VEIN.;  Surgeon: Corliss Skains, MD;  Location: [facility] OR;  Service: Open Heart Surgery;  Laterality: N/A;  flow track  . CORONARY THROMBECTOMY N/A 05/20/2020   Procedure: Coronary Thrombectomy;  Surgeon: Kathleene HazelMcAlhany, Christopher D, MD;  [facility];  Service: Cardiovascular;  Laterality: N/A;  . CORONARY/GRAFT ACUTE MI  REVASCULARIZATION N/A 05/14/2020   Procedure: Coronary/Graft Acute MI Revascularization;  Surgeon: Iran OuchArida, Muhammad A, MD;  Location: [facility] INVASIVE CV LAB;  Service: Cardiovascular;  Laterality: N/A;  . CORONARY/GRAFT ACUTE MI REVASCULARIZATION N/A 05/20/2020   Procedure: Coronary/Graft Acute MI Revascularization;  Surgeon: Kathleene HazelMcAlhany, Christopher D, MD;  Location: [facility];  Service: Cardiovascular;  Laterality: N/A;  . ENDOVEIN HARVEST OF GREATER SAPHENOUS VEIN Right 05/21/2020   Procedure: ENDOVEIN HARVEST OF RIGHT GREATER SAPHENOUS VEIN;  Surgeon: Corliss SkainsLightfoot, Harrell O, MD;  Location: [facility] OR;  Service: Open Heart Surgery;  Laterality: Right;  . INTRAVASCULAR PRESSURE WIRE/FFR STUDY N/A 05/16/2020   Procedure: INTRAVASCULAR PRESSURE WIRE/FFR STUDY;  Surgeon: Iran OuchArida, Muhammad A, MD;  Location: [facility] INVASIVE CV LAB;  Service: Cardiovascular;  Laterality: N/A;  . LEFT HEART CATH AND CORONARY ANGIOGRAPHY N/A 05/14/2020   Procedure: LEFT HEART CATH AND CORONARY ANGIOGRAPHY;  Surgeon: Iran OuchArida, Muhammad A, MD;  Location: [facility] INVASIVE CV LAB;  Service: Cardiovascular;  Laterality: N/A;  . LEFT HEART CATH AND CORONARY ANGIOGRAPHY N/A 05/16/2020   Procedure: LEFT HEART CATH AND CORONARY ANGIOGRAPHY;  Surgeon: Iran OuchArida, Muhammad A, MD;  Location: [facility] INVASIVE CV LAB;  Service: Cardiovascular;  Laterality: N/A;  . SHOULDER SURGERY    . TEE WITHOUT CARDIOVERSION N/A 05/21/2020   Procedure: TRANSESOPHAGEAL ECHOCARDIOGRAM (TEE);  Surgeon: Corliss SkainsLightfoot, Harrell O, MD;  [facility];  Service: Open Heart Surgery;  Laterality: N/A;   Family History  Problem Relation Age of Onset  . Heart attack Father    Social History   Socioeconomic History  . Marital status: Married    Spouse name: Not on file  . Number of children: Not on file  . Years of education: Not on file  . Highest education level: Not on file  Occupational History  . Not on file  Tobacco Use  . Smoking status: Current Some Day Smoker     Types: Cigarettes  . Smokeless tobacco: Never Used  . Tobacco comment: 1-2 cigarettes "every now and then"  Substance and Sexual Activity  . Alcohol use: No  . Drug use: Never  . Sexual activity: Not on file  Other Topics Concern  . Not on file  Social History Narrative  . Not on file   Social Determinants of Health   Financial Resource Strain: Not on file  Food Insecurity: Not on file  Transportation Needs: Not on file  Physical Activity: Not on file  Stress: Not on file  Social Connections: Not on file   No Known Allergies  Medications   Medications Prior to Admission  Medication Sig Dispense Refill Last Dose  . acetaminophen (TYLENOL) 325 MG tablet Take 650-975 mg by mouth every 6 (six) hours as needed for mild pain, fever or headache. (Patient not taking: Reported on 10/04/2020)   Not Taking at Unknown time  . aspirin EC 325 MG EC tablet Take 1 tablet (325 mg total) by mouth daily. (Patient not taking: Reported on 10/04/2020) 30 tablet 0 Not Taking at Unknown time  . atorvastatin (LIPITOR) 80 MG tablet Take 1 tablet (80 mg total) by mouth daily. (Patient not taking: Reported on 10/04/2020) 30 tablet 1 Not Taking at Unknown time  . guaiFENesin-dextromethorphan (ROBITUSSIN DM) 100-10 MG/5ML syrup Take 5 mLs by mouth every  4 (four) hours as needed for cough. (Patient not taking: Reported on 10/04/2020) 118 mL 0 Completed Course at Unknown time  . LORazepam (ATIVAN) 0.5 MG tablet Take 1 tablet (0.5 mg total) by mouth every 8 (eight) hours as needed for anxiety. (Patient not taking: Reported on 10/04/2020) 15 tablet 0 Completed Course at Unknown time  . metoprolol tartrate (LOPRESSOR) 25 MG tablet Take 1 tablet (25 mg total) by mouth 2 (two) times daily. (Patient not taking: Reported on 10/04/2020) 60 tablet 1 Not Taking at Unknown time  . sacubitril-valsartan (ENTRESTO) 24-26 MG Take 1 tablet by mouth 2 (two) times daily. (Patient not taking: Reported on 10/04/2020) 60 tablet 11 Not Taking  at Unknown time     Vitals   Vitals:   10/04/20 0129 10/04/20 0500 10/04/20 0535 10/04/20 0758  BP: 132/78  116/74 108/68  Pulse: 96  96 87  Resp: 17  16 15   Temp: 98.6 F (37 C)  98.5 F (36.9 C) 98.1 F (36.7 C)  TempSrc:    Oral  SpO2: 96%  100% 95%  Weight:  102.3 kg    Height:         Body mass index is 36.4 kg/m.  Physical Exam   Physical Exam Gen: alert, oriented to self and hospital, follows most simple commands but not multi-step Resp: CTAB, no w/r/r CV: RRR, no m/g/r; nml S1 and S2. 2+ symmetric peripheral pulses.  Neuro: *MS: alert, oriented to self and hospital, follows most simple commands but not multi-step *Speech: mild dysarthria, moderate aphasia expressive >> receptive, impaired naming and repetition *CN:    I: Deferred   II,III: PERRLA, VFF by confrontation, optic discs not visualized 2/2 pupillary construction   III,IV,VI: EOMI w/o nystagmus, no ptosis   V: Sensation intact from V1 to V3 to LT   VII: Eyelid closure was full.  Smile symmetric.   VIII: Hearing intact to voice   IX,X: Voice normal, palate elevates symmetrically    XI: SCM/trap 5/5 bilat   XII: Tongue protrudes midline, no atrophy or fasciculations   *Motor:   Normal bulk.  No tremor, rigidity or bradykinesia. No pronator drift.    Strength: Dlt Bic Tri WrE WrF FgS Gr HF KnF KnE PlF DoF    Left 5 5 5 5 5 5 5 5 5 5 5 5     Right 5 5 5 5 5 5 5 5 5 5 5 5     *Sensory: impaired to PP R thigh 2/2 old injury, otherwise intact throughout. No double-simultaneous extinction.  *Coordination:  FNF intact bilat *Reflexes:  2+ and symmetric throughout without clonus; toes down-going bilat *Gait: deferred  NIHSS  1a Level of Conscious.: 0 1b LOC Questions: 1 1c LOC Commands: 0 2 Best Gaze: 0 3 Visual: 0 4 Facial Palsy: 0 5a Motor Arm - left: 0 5b Motor Arm - Right: 0 6a Motor Leg - Left: 0 6b Motor Leg - Right: 0 7 Limb Ataxia: 0 8 Sensory: 1 9 Best Language: 2 10 Dysarthria:  1 11 Extinct. and Inatten.: 0  TOTAL: 5   Premorbid mRS = *2   Labs   CBC:  Recent Labs  Lab 10/03/20 2051 10/04/20 0515  WBC 16.8* 14.3*  NEUTROABS 15.3* 10.3*  HGB 15.0 13.2  HCT 45.9 39.6  MCV 93.5 91.2  PLT 245 234    Basic Metabolic Panel:  Lab Results  Component Value Date   NA 137 10/04/2020   K 4.0 10/04/2020   CO2  26 10/04/2020   GLUCOSE 130 (H) 10/04/2020   BUN 14 10/04/2020   CREATININE 0.74 10/04/2020   CALCIUM 9.3 10/04/2020   GFRNONAA >60 10/04/2020   GFRAA >60 04/21/2019   Lipid Panel:  Lab Results  Component Value Date   LDLCALC 64 05/15/2020   HgbA1c:  Lab Results  Component Value Date   HGBA1C 5.8 (H) 05/20/2020   Urine Drug Screen:     Component Value Date/Time   LABOPIA NONE DETECTED 10/03/2020 2232   COCAINSCRNUR NONE DETECTED 10/03/2020 2232   LABBENZ NONE DETECTED 10/03/2020 2232   AMPHETMU NONE DETECTED 10/03/2020 2232   THCU NONE DETECTED 10/03/2020 2232   LABBARB NONE DETECTED 10/03/2020 2232    Alcohol Level     Component Value Date/Time   ETH <10 10/03/2020 2051     Impression   Matthew Tapia is a 55 y.o. male with PMH significant for  has a past medical history of Anxiety, CAD, multiple vessel, Dyslipidemia, goal LDL below 70, Former smoker, HFrEF (heart failure with reduced ejection fraction) (HCC), Polysubstance abuse (HCC), and ST elevation myocardial infarction (STEMI) of inferior wall (HCC) p/w AMS and aphasia and found to have acute ischemic infarcts (posterior L temporal and L BG) on MRI.  Recommendations   # Acute ischemic stroke - Permissive HTN x48 hrs from sx onset or until stroke ruled out by MRI goal BP <220/110. PRN labetalol or hydralazine if BP above these parameters. Avoid oral antihypertensives. - MRA H&N showed possible (unlikely) carotid dissection, CTA H&N ordered for further evaluation - Atorvastatin 80mg  daily for LDL 139 - Continue ASA 81mg  daily for now. Do not add plavix 2/2 petechial  hemorrhage seen on brain MRI. Pharmacologic DVT prophylaxis is OK (benefit outweighs the risk). - q4 hr neuro checks - STAT head CT for any change in neuro exam - Tele - PT/OT/SLP - Stroke education - Amb referral to neurology upon discharge to include ambulatory cardiac monitoring.  #Encephalopathy (most or even all of this may actually be aphasia) - Stroke w/u per above - W/u for possible sepsis per primary team - rEEG - Seizure precautions - TSH, B12, RPR, HIV pending - Thiamine 100mg  daily   Will continue to follow ______________________________________________________________________   Thank you for the opportunity to take part in the care of this patient. If you have any further questions, please contact the neurology consultation attending.  Signed,  , MD [facility] [phone]  If 7pm- 7am, please page neurology on call as listed in AMION.

## 2020-10-04 NOTE — Progress Notes (Signed)
Spoke to ED RN to inquire about a repeat LA since the first one was 5.4 and she said it was not ordered until 0130. Will wait for result.

## 2020-10-04 NOTE — Procedures (Signed)
Routine EEG Report  Matthew Tapia is a 55 y.o. male with a history of aphasia 2/2 acute L temporal ischemic infarct who is undergoing an EEG to evaluate for seizures.  Report: This EEG was acquired with electrodes placed according to the International 10-20 electrode system (including Fp1, Fp2, F3, F4, C3, C4, P3, P4, O1, O2, T3, T4, T5, T6, A1, A2, Fz, Cz, Pz). The following electrodes were missing or displaced: none.  The occipital dominant rhythm was 9 Hz. This activity is reactive to stimulation. Drowsiness was manifested by background fragmentation; deeper stages of sleep were not identified. There was was intermittent L hemispheric focal slowing and persistent focal slowing over the L temporal region. There were no interictal epileptiform discharges. There were no electrographic seizures identified. There was no abnormal response to photic stimulation or hyperventilation.   Impression: This EEG was obtained while awake and drowsy and is abnormal due to L temporal slowing indicating focal cerebral dysfunction over the region of his known acute infarct.     Bing Neighbors, MD [facility] [phone]  If 7pm- 7am, please page neurology on call as listed in AMION.

## 2020-10-04 NOTE — Progress Notes (Signed)
*  PRELIMINARY RESULTS* Echocardiogram 2D Echocardiogram has been performed.  Cristela Blue 10/04/2020, 2:18 PM

## 2020-10-05 LAB — URINE CULTURE: Culture: NO GROWTH

## 2020-10-05 LAB — LIPID PANEL
Cholesterol: 208 mg/dL — ABNORMAL HIGH (ref 0–200)
HDL: 44 mg/dL (ref >40)
LDL Cholesterol: 132 mg/dL — ABNORMAL HIGH (ref 0–99)
Total CHOL/HDL Ratio: 4.7 RATIO
Triglycerides: 159 mg/dL — ABNORMAL HIGH (ref <150)
VLDL: 32 mg/dL (ref 0–40)

## 2020-10-05 LAB — RPR: RPR Ser Ql: NONREACTIVE

## 2020-10-05 MED ORDER — ASPIRIN 81 MG PO CHEW: 81.0000 mg | CHEWABLE_TABLET | Freq: Every day | ORAL | 1 refills | Status: AC

## 2020-10-05 MED ORDER — THIAMINE HCL 100 MG PO TABS: 100.0000 mg | ORAL_TABLET | Freq: Every day | ORAL | 0 refills | Status: AC

## 2020-10-05 NOTE — Evaluation (Signed)
Occupational Therapy Evaluation Patient Details Name: Matthew Tapia MRN: [id number] DOB: [birth year] Today's Date: 10/05/2020    History of Present Illness Pt is a 55 y/o M with PMH: anxiety, CAD multivessel s/p CABG x4 04/2020, CHF, HTN, HDL, DM, tobacco abuse and polysubstance abuse as well as anxiety who presents EMS from home after reportedly noted to be acting bizarrely by family. Pt's daughter described pt with difficulty word finding and also reported pt with a few bouts of vomiting. ED triage note states that dtr reports he has had similar presentation in the past after having used meth. UDS was negative for amphetamines. 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   Clinical Impression   Pt seen for OT evaluation this date in setting of acute hospitalization d/t CVA. Pt reports being completely INDEP at baseline for self care and mobility with no AD/DME. Pt presents this date with very mild coordination deficits to L hand on rapid alternating movements test, but is otherwise functional in his ability to perform fine motor coordination tasks. ROM/strength of UEs appears to be equal and [facility]. Pt demos no focal balance deficits and completes fxl mobility in the room, to/from restroom with no AD with no gross LOB. Pt left in chair with all needs met and in reach. OT educates re: stroke s/s to improve recognition and reaction time in the future to reduce risk of loss of function and INDEP. Pt with good understanding, handout issued. No further OT needs detected.     Follow Up Recommendations  No OT follow up    Equipment Recommendations  None recommended by OT    Recommendations for Other Services       Precautions / Restrictions Restrictions Weight Bearing Restrictions: No      Mobility Bed Mobility Overal bed mobility: Independent                  Transfers Overall transfer level: Independent                     Balance Overall balance assessment: Independent                                         ADL either performed or assessed with clinical judgement   ADL Overall ADL's : Modified independent;At baseline                                             Vision Patient Visual Report: No change from baseline Vision Assessment?: Yes Eye Alignment: Impaired (comment) (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 Pt reports noting no changes in his vision) Additional Comments: very minimal (only noticable on 2/5 trials), overshoot with finger to nose test with R hand     Perception     Praxis      Pertinent Vitals/Pain Pain Assessment: No/denies pain     Hand Dominance Right   Extremity/Trunk Assessment Upper Extremity Assessment Upper Extremity Assessment: Overall WFL for tasks assessed;LUE deficits/detail LUE Deficits /  Details: some very slight potential decreased coordination of L UE versus R which is surprising given location of infarcts. Overall fxl strength and coordination, but very slightly slower than R hand on rapid alternating movements test.   Lower Extremity Assessment Lower Extremity Assessment: Defer to PT evaluation;Overall WFL for tasks assessed       Communication Communication Communication: Other (comment) (pt does appear to have some word-finding difficulties or use a lot of conversational fillers. But what is stated is clear and pt is able to name random objects in environment with no difficulty and repeat statements made by OT.)   Cognition Arousal/Alertness: Awake/alert Behavior During Therapy: WFL for tasks assessed/performed Overall Cognitive Status: Within Functional Limits for tasks assessed                                  General Comments: some word finding difficulties/increased time, but overall appropriate. Able to follow all commands. Oriented to place/time/situation/self.   General Comments       Exercises Other Exercises Other Exercises: OT faciliates ed re: role of OT in acute setting, stroke signs/symptoms to improve pt recognition in the future and prevent detriment to his mobility/ADL indepdnence. Pt given handout.   Shoulder Instructions      Home Living Family/patient expects to be discharged to:: Private residence Living Arrangements: Parent;Other relatives (his mom) Available Help at Discharge: Family;Available PRN/intermittently                         Home Equipment: None          Prior Functioning/Environment Level of Independence: Independent        Comments: Pt reports being INDEP for all self care at baseline including HH IADLs.        OT Problem List: Decreased coordination      OT Treatment/Interventions: Neuromuscular education    OT Goals(Current goals can be found in the care plan section) Acute Rehab OT Goals Patient Stated Goal: to go home OT Goal Formulation: All assessment and education complete, DC therapy  OT Frequency:     Barriers to D/C:            Co-evaluation              AM-PAC OT "6 Clicks" Daily Activity     Outcome Measure Help from another person eating meals?: None Help from another person taking care of personal grooming?: None Help from another person toileting, which includes using toliet, bedpan, or urinal?: None Help from another person bathing (including washing, rinsing, drying)?: None Help from another person to put on and taking off regular upper body clothing?: None Help from another person to put on and taking off regular lower body clothing?: None 6 Click Score: 24   End of Session Nurse Communication: Mobility status  Activity Tolerance: Patient tolerated treatment  well Patient left: in chair;with call bell/phone within reach  OT Visit Diagnosis: Other (comment) (R27.8 other lack of coordination)                Time: 3838-1840 OT Time Calculation (min): 35 min Charges:  OT General Charges $OT Visit: 1 Visit OT Evaluation $OT Eval Low Complexity: 1 Low OT Treatments $Self Care/Home Management : 8-22 mins $Neuromuscular Re-education: 8-22 mins  Gerrianne Scale, [place], OTR/L ascom [phone] 10/05/20, 11:32 AM

## 2020-10-05 NOTE — Progress Notes (Signed)
Pt verbalized understanding of all discharge instructions including follow up appointments.

## 2020-10-05 NOTE — Progress Notes (Signed)
SLP Cancellation Note  Patient Details Name: Matthew Tapia MRN: [id number] DOB: 09-Jan-1966   Cancelled treatment:       Reason Eval/Treat Not Completed: Patient at procedure or test/unavailable (chart reviewed; pt is in OT session). Pt currently w/ another discipline at this time. Will re-attempt Cognitive-linguistic evaluation at a later time.      Jerilynn Som, MS, CCC-SLP Speech Language Pathologist Rehab Services [phone] [facility] 10/05/2020, 10:22 AM

## 2020-10-05 NOTE — Evaluation (Signed)
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 Pt's daughter described pt with difficulty word finding and also reported pt with a few bouts of vomiting. ED triage note states that dtr reports he has had similar presentation in the past after having used meth. UDS was negative for amphetamines. 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    Clinical Impression  Pt is a 55 year old M admitted to hospital on 10/03/20 for stroke-like symptoms. At baseline, pt was Ind with all ADL's, IADL's, community ambulation without AD, and driving. Pt presents with difficulty with word finding, but otherwise adequate strength, balance, endurance, and safety awareness required for safe and independent functional mobility. Pt was grossly Ind for all transfers, gait, and stair training. Pt currently without deficits and states his functional mobility is at baseline. Therefore, PT to sign off on acute PT needs at this time. Will recommend DC home with no further therapy needs; pt agreeable.      Follow Up Recommendations No PT follow up    Equipment Recommendations  None recommended by PT    Recommendations for Other Services       Precautions / Restrictions Precautions Precautions: None Restrictions Weight Bearing Restrictions: No      Mobility  Bed Mobility Overal bed mobility: Independent             General bed mobility comments: Pt seated in recliner upon entry    Transfers Overall transfer level: Independent Equipment used: None             General transfer comment: Ind with STS from recliner, and use  of BUE on arm rests for support  Ambulation/Gait Ambulation/Gait assistance: Supervision Gait Distance (Feet): 200 Feet Assistive device: None       General Gait Details: Supervision for safety to ambulate without AD, however, pt able to be Ind with mobility. Pt demonstrates fast cadence, early reciprocal gait, and good balance. Mild gait deficits noted with horizontal head turns during 4-item DGI testing, however, score of 11/12 indicates pt is at low risk of falls.  Stairs Stairs: Yes Stairs assistance: Supervision Stair Management: One rail Right;Alternating pattern Number of Stairs: 5 General stair comments: Supervision for safety to navigate 5 steps with reciprocal gait pattern and unilateral UE support. Pt able to be Ind with mobility.     Balance Overall balance assessment: Independent                                           Pertinent Vitals/Pain Pain Assessment: No/denies pain    Home Living Family/patient expects to be discharged to:: Private residence Living Arrangements: Parent;Other relatives (81yo mother) Available Help at Discharge: Family;Available PRN/intermittently Type of Home: House Home Access: Stairs to enter Entrance Stairs-Rails: Lawyer of Steps: 4-5 STE with bil railing Home Layout: One level Home Equipment: None      Prior Function Level of Independence: Independent         Comments: Pt reports independence with ADL's, IADL's, community ambulation without  AD, driving, and working. However, pt states he lost his job and is currently unemployed.     Hand Dominance   Dominant Hand: Right    Extremity/Trunk Assessment   Upper Extremity Assessment Upper Extremity Assessment: Defer to OT evaluation LUE Deficits / Details: some very slight potential decreased coordination of L UE versus R which is surprising given location of infarcts. Overall fxl strength and coordination, but very slightly  slower than R hand on rapid alternating movements test.    Lower Extremity Assessment Lower Extremity Assessment: Overall WFL for tasks assessed (grossly 5/5 with no sensory or coordination deficits)    Cervical / Trunk Assessment Cervical / Trunk Assessment: Normal  Communication   Communication:  (pt does appear to have some word-finding difficulties or use a lot of conversational fillers)  Cognition Arousal/Alertness: Awake/alert Behavior During Therapy: WFL for tasks assessed/performed Overall Cognitive Status: Within Functional Limits for tasks assessed                                 General Comments: some word finding difficulties/increased time, but overall appropriate. A&O x4 and able to follow 100% of simple multistep commands      General Comments      Exercises Other Exercises Other Exercises: Pt able to participate in transfers, gait, and stair training today. Pt grossly Ind for all mobility, demonstrating good safety awareness. Other Exercises: Pt educated regarding: PT role/POC, DC recommendations, safety with mobility, follow up with PCP for any residual deficits if referral to PT is needed. He verablized understanding.   Assessment/Plan    PT Assessment Patent does not need any further PT services         PT Goals (Current goals can be found in the Care Plan section)  Acute Rehab PT Goals Patient Stated Goal: to go home     AM-PAC PT "6 Clicks" Mobility  Outcome Measure Help needed turning from your back to your side while in a flat bed without using bedrails?: None Help needed moving from lying on your back to sitting on the side of a flat bed without using bedrails?: None Help needed moving to and from a bed to a chair (including a wheelchair)?: None Help needed standing up from a chair using your arms (e.g., wheelchair or bedside chair)?: None Help needed to walk in hospital room?: None Help needed climbing 3-5 steps with a railing? : A  Little 6 Click Score: 23    End of Session Equipment Utilized During Treatment: Gait belt Activity Tolerance: Patient tolerated treatment well Patient left: in chair;with nursing/sitter in room Nurse Communication: Mobility status PT Visit Diagnosis: Unsteadiness on feet (R26.81)    Time: 7412-8786 PT Time Calculation (min) (ACUTE ONLY): 16 min   Charges:   PT Evaluation $PT Eval Low Complexity: 1 Low         Vira Blanco, PT, DPT 11:58 AM,10/05/20

## 2020-10-05 NOTE — Discharge Summary (Signed)
Matthew Tapia:[id number] DOB: 1966/03/05 DOA: 10/03/2020  PCP: Miki Kins, FNP  Admit date: 10/03/2020 Discharge date: 10/05/2020  Admitted From: home Disposition:  home  Recommendations for Outpatient Follow-up:  1. Follow up with PCP in 1 week 2. Please obtain BMP/CBC in one week 3. Dr. Kirke Corin in one week to discuss his heart meds 4. Dr. Sherryll Burger neurology in one week     Discharge Condition:Stable CODE STATUS:full  Diet recommendation: Heart Healthy  Brief/Interim Summary: PER NFA:OZHY D Pickardis a 55 y.o.malewith medical history significant forpolysubstance abuse with history of cocaine and amphetamine abuse, chronic combined systolic/diastolic heart failure, hyperlipidemia, coronary artery disease status post four-vessel CABG in December 2021, generalized anxiety disorder,who is admitted to [facility] on 5/12/2022with acute encephalopathyafter presenting from home to [facility] ED for evaluation of altered mental status. About a day earlier he began acting strangely, appearing confused, not answering questions appropriately, with difficulty findings his words and inappropriate word substitutions.Daughter notes that the patient has had very little food or water over the course of the last day.  She is not aware the patient has been experiencing any abdominal pain, diarrhea, melena, or hematochezia. Had emesis. Was admitted to the hospital, and MRI revealed acute infarcts. Neurology was consulted. COVID-19/influenza PCR was performed in the emergency department this evening and found to be negative.    #) Acute encephalopathy/Acute ischemic infarcts Patient presented Confusion with evidence suggestive of expressive dysphasia CT head no acute abn. MRI brain with patchy multifocal acute ischemic infarcts of post. Left temporoparietal region and Lt basal ganglia. Possible associated mild petechial hemorrhage without frank hemorrhagic transformation. Neurology  consult- recommended asa  qd, no plavix due to petechial hemorrhage on MRI D/w pt taking his statin for goal LDL <70 Follow up with cardiology for echo F/u with neurology as outpatient His speech resolved and today speaking clearly and no confusion.  Neurology cleared patient for discharge via phone      #) Lactic acidosis: Presenting lactate noted to be 5.4.Likely multifactorial in its elevation, with contribution from dehydration, stroke, and   suspected starvation keto lactic acidosis given report of very limited oral intake over the course of the last day, with presence of ketones in the absence of hyperglycemia on presenting labs. Was given IVF with improvement of lactic acidosis      #) Dehydration: Improved with IVF     #) chronic Leukocytosis: afebrile No signs of infection F/u with pcp for further management and monitoing    #) Chronic combined systolic/diastolic heart failure: With most echocardiogram on 05/15/2020 showing LVEF 35 to 40% with grade 1 diastolic dysfunction  Euvolemic on exam Not taking his cardiac meds.  Needs to f/u with primary cardiologist for reevaluation and needs to be compliant with medical mx  #) Hyperlipidemia: continue statin     #) History of polysubstanceabuse: tox negative  Was counseled about substance abuse during this hospitalization    Discharge Diagnoses:  Principal Problem:   Acute encephalopathy Active Problems:   Leukocytosis   Hyperlipidemia LDL goal <70   Lactic acidosis   Dehydration   Chronic combined systolic and diastolic CHF (congestive heart failure) [facility])    Discharge Instructions  Discharge Instructions    Ambulatory referral to Neurology   Complete by: As directed    An appointment is requested in approximately: 4-6 wks   Diet - low sodium heart healthy   Complete by: As directed    Discharge instructions   Complete by: As  directed    Follow up with neurology  and pcp in one week Follow up with your cardiologist in one week to discuss your meds  Take a baby chewable aspirin 81mg  daily Take your cholestererol medication as your cholesterol is very high Do not operate or drive until you see your neurologist   Increase activity slowly   Complete by: As directed      Allergies as of 10/05/2020   No Known Allergies     Medication List    STOP taking these medications   acetaminophen 325 MG tablet Commonly known as: TYLENOL   aspirin 325 MG EC tablet Replaced by: aspirin 81 MG chewable tablet   Entresto 24-26 MG Generic drug: sacubitril-valsartan   guaiFENesin-dextromethorphan 100-10 MG/5ML syrup Commonly known as: ROBITUSSIN DM   LORazepam 0.5 MG tablet Commonly known as: Ativan   metoprolol tartrate 25 MG tablet Commonly known as: LOPRESSOR     TAKE these medications   aspirin 81 MG chewable tablet Chew 1 tablet (81 mg total) by mouth daily. Start taking on: Oct 06, 2020 Replaces: aspirin 325 MG EC tablet   atorvastatin 80 MG tablet Commonly known as: LIPITOR Take 1 tablet (80 mg total) by mouth daily.   thiamine 100 MG tablet Take 1 tablet (100 mg total) by mouth daily. Start taking on: Oct 06, 2020       Follow-up Information    Oct 08, 2020, MD Follow up in 1 week(s).   Specialty: Cardiology Contact information: [address] [place] [phone]        [phone], FNP Follow up in 1 week(s).   Specialty: Family Medicine Contact information: [address] [place] [phone]        [phone], MD Follow up in 1 week(s).   Specialty: Neurology Contact information: [address] [facility] [place] [phone]              No Known Allergies  Consultations:  neurolgy   Procedures/Studies: CT ANGIO HEAD NECK W WO CM  Result Date: 10/04/2020 CLINICAL DATA:  Neuro deficit, acute,  stroke suspected; rule out possible dissection seen on MRA. EXAM: CT ANGIOGRAPHY HEAD AND NECK TECHNIQUE: Multidetector CT imaging of the head and neck was performed using the standard protocol during bolus administration of intravenous contrast. Multiplanar CT image reconstructions and MIPs were obtained to evaluate the vascular anatomy. Carotid stenosis measurements (when applicable) are obtained utilizing NASCET criteria, using the distal internal carotid diameter as the denominator. CONTRAST:  38mL OMNIPAQUE IOHEXOL 350 MG/ML SOLN COMPARISON:  MRI head, MRA head, MRA neck 10/04/2020. Noncontrast head CT 10/03/2020. FINDINGS: CT HEAD FINDINGS Brain: Cerebral volume is normal for age. Redemonstrated patchy cortical/subcortical acute infarcts within the left MCA vascular territory affecting the left temporoparietal lobes and left basal ganglia. Additional subtle acute infarction changes within the anterior left temporal lobe were better appreciated on the prior brain MRI. The associated subtle petechial hemorrhage questioned on the brain MRI performed earlier today is not appreciable by CT. No significant mass effect. No extra-axial fluid collection. No evidence of intracranial mass. No midline shift. Vascular: Reported below. Skull: Normal. Negative for fracture or focal lesion. Sinuses: Mild bilateral ethmoid sinus mucosal thickening. Small right sphenoid sinus mucous retention cyst. Orbits: No mass or acute finding. Review of the MIP images confirms the above findings CTA NECK FINDINGS Aortic arch: Common origin of the innominate and left common carotid arteries. Atherosclerotic  plaque within the visualized aortic arch and proximal major branch vessels of the neck. No hemodynamically significant innominate or proximal subclavian artery stenosis. Right carotid system: CCA and ICA patent within the neck without significant stenosis (50% or greater). Mild soft and calcified plaque within the carotid bifurcation.  Left carotid system: CCA and ICA patent within the neck without significant stenosis (50% or greater). Mild soft plaque within the mid CCA and proximal ICA. Vertebral arteries: Codominant and patent within the neck without stenosis. Skeleton: No acute bony abnormality or aggressive osseous lesion. Other neck: No neck mass or cervical lymphadenopathy. Upper chest: No consolidation within the imaged lung apices. Prior median sternotomy/CABG. Review of the MIP images confirms the above findings CTA HEAD FINDINGS Anterior circulation: The intracranial internal carotid arteries are patent. Calcified plaque within the paraclinoid left ICA with resultant mild to moderate stenosis. The M1 middle cerebral arteries are patent. There is segmental occlusion of a distal M2 left MCA vessel (inferior division) (series 15, image 27) (hyperdense thrombus can also be appreciated within this vessel on the noncontrast head CT series 6, images 38 and 39). No right M2 proximal branch occlusion or high-grade proximal stenosis is identified. The anterior cerebral arteries are patent. 1 mm posteriorly projecting vascular protrusion arising from the paraclinoid right ICA which may reflect an aneurysm or infundibulum (series 10, image 254) (series 12, image 72). Posterior circulation: The intracranial vertebral arteries are patent. The basilar artery is patent. The posterior cerebral arteries are patent. Posterior communicating arteries are hypoplastic or absent bilaterally. Venous sinuses: Within the limitations of contrast timing, no convincing thrombus. Anatomic variants: As described Review of the MIP images confirms the above findings CTA head impression #1 will be called to the ordering clinician or representative by the Radiologist Assistant, and communication documented in the PACS or [employer]. IMPRESSION: CT head: Redemonstrated patchy cortical/subcortical acute infarcts within the left MCA vascular territory, affecting the  left temporoparietal lobes and left basal ganglia. Additional subtle acute infarction changes within the anterior left temporal lobe were better appreciated on the prior brain MRI. CTA neck: 1. The bilateral common and internal carotid arteries are patent within the neck without hemodynamically significant stenosis (50% or greater). Mild soft plaque within the mid left CCA and proximal left ICA. Mild soft and calcified plaque within the right carotid bifurcation. No evidence of carotid artery dissection within the neck. 2. Vertebral arteries codominant and patent within the neck without stenosis. CTA head: 1. Segmental occlusion of a distal M2 left middle cerebral artery vessel (inferior division). 2. Atherosclerotic plaque results in mild/moderate narrowing of the paraclinoid left ICA. 3. 1 mm posteriorly projecting vascular protrusion arising from the paraclinoid right ICA, which may reflect a small aneurysm or infundibulum. Electronically Signed   By: Jackey Loge DO   On: 10/04/2020 19:16   DG Chest 2 View  Result Date: 10/03/2020 CLINICAL DATA:  Altered mental status.  Vomiting. EXAM: CHEST - 2 VIEW COMPARISON:  07/26/2020 FINDINGS: Post median sternotomy and CABG. Normal heart size. Normal mediastinal contours. No pulmonary edema, pleural effusion, or focal airspace disease. Improved right basilar atelectasis from prior. No pneumothorax. Air-fluid level noted in the stomach. No acute osseous abnormalities are seen. IMPRESSION: 1. No acute findings. Improved right basilar atelectasis. 2. Air-fluid level in the stomach, also seen on prior exams. Electronically Signed   By: Narda Rutherford M.D.   On: 10/03/2020 21:03   CT Head Wo Contrast  Result Date: 10/03/2020 CLINICAL DATA:  Altered mental  status EXAM: CT HEAD WITHOUT CONTRAST TECHNIQUE: Contiguous axial images were obtained from the base of the skull through the vertex without intravenous contrast. COMPARISON:  CT brain 03/06/2010 FINDINGS: Brain:  No acute territorial infarction, hemorrhage or intracranial mass. The ventricles are nonenlarged Vascular: No hyperdense vessel or unexpected calcification. Skull: Normal. Negative for fracture or focal lesion. Sinuses/Orbits: Patchy mucosal disease in the sinuses Other: None IMPRESSION: Negative non contrasted CT appearance of the brain Electronically Signed   By: Jasmine Pang M.D.   On: 10/03/2020 21:13   MR ANGIO HEAD WO CONTRAST  Result Date: 10/04/2020 CLINICAL DATA:  Expressive aphasia. Multifocal infarctions left MCA territory. EXAM: MRA NECK WITHOUT AND WITH CONTRAST MRA HEAD WITHOUT CONTRAST TECHNIQUE: Multiplanar and multiecho pulse sequences of the neck were obtained without and with intravenous contrast. Angiographic images of the neck were obtained using MRA technique without and with intravenous contrast; Angiographic images of the Circle of Willis were obtained using MRA technique without intravenous contrast. CONTRAST:  10mL GADAVIST GADOBUTROL 1 MMOL/ML IV SOLN COMPARISON:  Brain MRI earlier same day FINDINGS: MRA NECK FINDINGS Aortic arch appears normal. Branching pattern is normal without origin stenosis. Left common carotid artery is widely patent. Shallow impression upon the medial wall in the midportion. Most commonly, this may be due to minor atherosclerotic plaque. A local dissection is not excluded. Consider CT angiography with contrast. Carotid bifurcation is normal. Cervical internal carotid artery is normal. On the right, the common carotid artery appears normal. Mild narrowing of the proximal ECA. No ICA abnormality. Both vertebral arteries appear normal. MRA HEAD FINDINGS Both internal carotid arteries are widely patent into the brain. No siphon stenosis. The anterior and middle cerebral vessels are patent without proximal stenosis, aneurysm or vascular malformation. One could question narrowing of a left parietal region inferior division M3 branch. Both vertebral arteries are  widely patent to the basilar. No basilar stenosis. Posterior circulation branch vessels appear normal. IMPRESSION: Intracranial MR angiography does not show any large vessel occlusion. One could question some narrowing of a distal left inferior division M3 branch. MR angiography of the neck does not show a definite dissection. There is questionable mild smooth indentation along the medial wall of the midportion of the common carotid artery. Most often, this would be due to minor atherosclerotic plaque. Minor dissection of the common carotid artery can not be excluded using this technique. I am doubtful that this represents a dissection. If concern persists, CT angiography could be considered. Electronically Signed   By: Paulina Fusi M.D.   On: 10/04/2020 13:20   MR ANGIO NECK W WO CONTRAST  Result Date: 10/04/2020 CLINICAL DATA:  Expressive aphasia. Multifocal infarctions left MCA territory. EXAM: MRA NECK WITHOUT AND WITH CONTRAST MRA HEAD WITHOUT CONTRAST TECHNIQUE: Multiplanar and multiecho pulse sequences of the neck were obtained without and with intravenous contrast. Angiographic images of the neck were obtained using MRA technique without and with intravenous contrast; Angiographic images of the Circle of Willis were obtained using MRA technique without intravenous contrast. CONTRAST:  10mL GADAVIST GADOBUTROL 1 MMOL/ML IV SOLN COMPARISON:  Brain MRI earlier same day FINDINGS: MRA NECK FINDINGS Aortic arch appears normal. Branching pattern is normal without origin stenosis. Left common carotid artery is widely patent. Shallow impression upon the medial wall in the midportion. Most commonly, this may be due to minor atherosclerotic plaque. A local dissection is not excluded. Consider CT angiography with contrast. Carotid bifurcation is normal. Cervical internal carotid artery is normal. On the right,  the common carotid artery appears normal. Mild narrowing of the proximal ECA. No ICA abnormality. Both  vertebral arteries appear normal. MRA HEAD FINDINGS Both internal carotid arteries are widely patent into the brain. No siphon stenosis. The anterior and middle cerebral vessels are patent without proximal stenosis, aneurysm or vascular malformation. One could question narrowing of a left parietal region inferior division M3 branch. Both vertebral arteries are widely patent to the basilar. No basilar stenosis. Posterior circulation branch vessels appear normal. IMPRESSION: Intracranial MR angiography does not show any large vessel occlusion. One could question some narrowing of a distal left inferior division M3 branch. MR angiography of the neck does not show a definite dissection. There is questionable mild smooth indentation along the medial wall of the midportion of the common carotid artery. Most often, this would be due to minor atherosclerotic plaque. Minor dissection of the common carotid artery can not be excluded using this technique. I am doubtful that this represents a dissection. If concern persists, CT angiography could be considered. Electronically Signed   By: Paulina Fusi M.D.   On: 10/04/2020 13:20   MR BRAIN WO CONTRAST  Result Date: 10/04/2020 CLINICAL DATA:  Initial evaluation for neuro deficit, stroke suspected, expressive aphasia. EXAM: MRI HEAD WITHOUT CONTRAST TECHNIQUE: Multiplanar, multiecho pulse sequences of the brain and surrounding structures were obtained without intravenous contrast. COMPARISON:  Prior CT from 10/03/2020 FINDINGS: Brain: Examination technically limited by motion artifact and the patient's inability to tolerate the full length of the study. Cerebral volume within normal limits for age. Patchy T2/FLAIR hyperintensity noted involving the periventricular and deep white matter both cerebral hemispheres, most like related chronic microvascular ischemic disease, mild in nature. Patchy multifocal areas of restricted diffusion seen involving the cortical and subcortical  aspects of the posterior left temporoparietal region. Area of involvement greatest at the level of the superior left temporal gyrus and measures approximately 3 cm in size (series 5, image 24). Mild patchy involvement of the left caudate noted (series 5, image 29). Possible associated petechial hemorrhage without frank hemorrhagic transformation (series 13, image 31). No associated mass effect. No other evidence for acute or subacute ischemia. Gray-white matter differentiation otherwise maintained. No evidence for acute intracranial hemorrhage. No mass lesion, midline shift or mass effect. No hydrocephalus or extra-axial fluid collection. No made of a partially empty sella. Midline structures intact. Vascular: Major intracranial vascular flow voids are grossly maintained. Skull and upper cervical spine: Craniocervical junction within normal limits. Bone marrow signal intensity normal. No scalp soft tissue abnormality. Sinuses/Orbits: Globes and orbital soft tissues within normal limits. Mild scattered mucosal thickening noted within the ethmoidal air cells and maxillary sinuses. Paranasal sinuses are otherwise clear. No mastoid effusion. Inner ear structures grossly normal. Other: None. IMPRESSION: 1. Patchy multifocal acute ischemic infarcts involving the posterior left temporoparietal region and left basal ganglia as above. Possible associated mild petechial hemorrhage without frank hemorrhagic transformation or mass effect. 2. Underlying mild chronic microvascular ischemic disease. Electronically Signed   By: Rise Mu M.D.   On: 10/04/2020 03:33   EEG adult  Result Date: 10/04/2020 Jefferson Fuel, MD     10/04/2020  7:58 PM Routine EEG Report GAGANDEEP PETTET is a 55 y.o. male with a history of aphasia 2/2 acute L temporal ischemic infarct who is undergoing an EEG to evaluate for seizures. Report: This EEG was acquired with electrodes placed according to the International 10-20 electrode system  (including Fp1, Fp2, F3, F4, C3, C4, P3, P4, O1,  O2, T3, T4, T5, T6, A1, A2, Fz, Cz, Pz). The following electrodes were missing or displaced: none. The occipital dominant rhythm was 9 Hz. This activity is reactive to stimulation. Drowsiness was manifested by background fragmentation; deeper stages of sleep were not identified. There was was intermittent L hemispheric focal slowing and persistent focal slowing over the L temporal region. There were no interictal epileptiform discharges. There were no electrographic seizures identified. There was no abnormal response to photic stimulation or hyperventilation. Impression: This EEG was obtained while awake and drowsy and is abnormal due to L temporal slowing indicating focal cerebral dysfunction over the region of his known acute infarct.   Bing Neighbors, MD [facility] [phone] If 7pm- 7am, please page neurology on call as listed in AMION.   ECHOCARDIOGRAM COMPLETE BUBBLE STUDY  Result Date: 10/04/2020    ECHOCARDIOGRAM REPORT   Patient Name:   Matthew Tapia Date of Exam: 10/04/2020 Medical Rec #:  [id number]      Height:       66.0 in Accession #:    [id number]     Weight:       225.5 lb Date of Birth:  [birth year]      BSA:          2.105 m Patient Age:    55 years       BP:           Not listed in chart/Not listed in                                              chart mmHg Patient Gender: M              HR:           Not listed in chart bpm. Exam Location:  [facility] Procedure: 2D Echo, Cardiac Doppler, Color Doppler and Strain Analysis Indications:     Stroke 434.91 / I63.9  History:         Patient has prior history of Echocardiogram examinations, most                  recent 05/21/2020. STEMI, former smoker, polysubstance abuse.  Sonographer:     Cristela Blue RDCS (AE) Referring Phys:  [id number] Malachi Carl STACK Diagnosing Phys: Arnoldo Hooker MD  Sonographer Comments: Global longitudinal strain was attempted. IMPRESSIONS  1. Left ventricular ejection  fraction, by estimation, is 40 to 45%. The left ventricle has normal function. The left ventricle demonstrates regional wall motion abnormalities (see scoring diagram/findings for description). Left ventricular diastolic parameters were normal.  2. Right ventricular systolic function is normal. The right ventricular size is normal.  3. The mitral valve is normal in structure. Mild to moderate mitral valve regurgitation.  4. The aortic valve is normal in structure. Aortic valve regurgitation is trivial. FINDINGS  Left Ventricle: Left ventricular ejection fraction, by estimation, is 40 to 45%. The left ventricle has normal function. The left ventricle demonstrates regional wall motion abnormalities. Moderate hypokinesis of the left ventricular, basal inferior wall. The left ventricular internal cavity size was normal in size. There is no left ventricular hypertrophy. Left ventricular diastolic parameters were normal. Right Ventricle: The right ventricular size is normal. No increase in right ventricular wall thickness. Right ventricular systolic function is normal. Left Atrium: Left atrial size was normal in size. Right Atrium: Right atrial size  was normal in size. Pericardium: There is no evidence of pericardial effusion. Mitral Valve: The mitral valve is normal in structure. Mild to moderate mitral valve regurgitation. Tricuspid Valve: The tricuspid valve is normal in structure. Tricuspid valve regurgitation is mild. Aortic Valve: The aortic valve is normal in structure. Aortic valve regurgitation is trivial. Aortic valve mean gradient measures 5.0 mmHg. Aortic valve peak gradient measures 8.8 mmHg. Aortic valve area, by VTI measures 1.03 cm. Pulmonic Valve: The pulmonic valve was normal in structure. Pulmonic valve regurgitation is not visualized. Aorta: The aortic root and ascending aorta are structurally normal, with no evidence of dilitation. IAS/Shunts: No atrial level shunt detected by color flow Doppler.   LEFT VENTRICLE PLAX 2D LVIDd:         5.22 cm  Diastology LVIDs:         3.87 cm  LV e' medial:    9.25 cm/s LV PW:         1.80 cm  LV E/e' medial:  9.3 LV IVS:        0.95 cm  LV e' lateral:   10.60 cm/s LVOT diam:     2.00 cm  LV E/e' lateral: 8.1 LV SV:         22 LV SV Index:   10 LVOT Area:     3.14 cm  RIGHT VENTRICLE RV Basal diam:  3.41 cm RV S prime:     13.80 cm/s TAPSE (M-mode): 4.7 cm LEFT ATRIUM             Index       RIGHT ATRIUM           Index LA diam:        5.20 cm 2.47 cm/m  RA Area:     20.80 cm LA Vol (A2C):   81.5 ml 38.73 ml/m RA Volume:   64.20 ml  30.51 ml/m LA Vol (A4C):   42.0 ml 19.96 ml/m LA Biplane Vol: 58.3 ml 27.70 ml/m  AORTIC VALVE                   PULMONIC VALVE AV Area (Vmax):    1.20 cm    PV Vmax:        0.52 m/s AV Area (Vmean):   1.25 cm    PV Peak grad:   1.1 mmHg AV Area (VTI):     1.03 cm    RVOT Peak grad: 4 mmHg AV Vmax:           148.50 cm/s AV Vmean:          94.350 cm/s AV VTI:            0.213 m AV Peak Grad:      8.8 mmHg AV Mean Grad:      5.0 mmHg LVOT Vmax:         56.80 cm/s LVOT Vmean:        37.400 cm/s LVOT VTI:          0.070 m LVOT/AV VTI ratio: 0.33  AORTA Ao Root diam: 3.60 cm MITRAL VALVE               TRICUSPID VALVE MV Area (PHT): 4.15 cm    TR Peak grad:   11.0 mmHg MV Decel Time: 183 msec    TR Vmax:        166.00 cm/s MV E velocity: 85.70 cm/s MV A velocity: 90.00 cm/s  SHUNTS MV E/A ratio:  0.95  Systemic VTI:  0.07 m                            Systemic Diam: 2.00 cm Arnoldo Hooker MD Electronically signed by Arnoldo Hooker MD Signature Date/Time: 10/04/2020/3:07:05 PM    Final        Subjective: Feels much better. Speech is clear. No complaints  Discharge Exam: Vitals:   10/05/20 0405 10/05/20 0730  BP: 109/68 108/72  Pulse: 84 80  Resp: 16 16  Temp: 98.2 F (36.8 C) 98 F (36.7 C)  SpO2: 95% 95%   Vitals:   10/04/20 2324 10/05/20 0405 10/05/20 0500 10/05/20 0730  BP: 100/64 109/68  108/72  Pulse: 85 84  80   Resp: Temp: 98.6 F (37 C) 98.2 F (36.8 C)  98 F (36.7 C)  TempSrc: Oral   Oral  SpO2: 97% 95%  95%  Weight:   102.2 kg   Height:        General: Pt is alert, awake, not in acute distress, clear speech, no focal deficity Cardiovascular: RRR, S1/S2 +, no rubs, no gallops Respiratory: CTA bilaterally, no wheezing, no rhonchi Abdominal: Soft, NT, ND, bowel sounds + Extremities: no edema, no cyanosis     The results of significant diagnostics from this hospitalization (including imaging, microbiology, ancillary and laboratory) are listed below for reference.     Microbiology: Recent Results (from the past 240 hour(s))  Resp Panel by RT-PCR (Flu A&B, Covid) Nasopharyngeal Swab     Status: None   Collection Time: 10/03/20 10:32 PM   Specimen: Nasopharyngeal Swab; Nasopharyngeal(NP) swabs in vial transport medium  Result Value Ref Range Status   SARS Coronavirus 2 by RT PCR NEGATIVE NEGATIVE Final    Comment: (NOTE) SARS-CoV-2 target nucleic acids are NOT DETECTED.  The SARS-CoV-2 RNA is generally detectable in upper respiratory specimens during the acute phase of infection. The lowest concentration of SARS-CoV-2 viral copies this assay can detect is 138 copies/mL. A negative result does not preclude SARS-Cov-2 infection and should not be used as the sole basis for treatment or other patient management decisions. A negative result may occur with  improper specimen collection/handling, submission of specimen other than nasopharyngeal swab, presence of viral mutation(s) within the areas targeted by this assay, and inadequate number of viral copies(<138 copies/mL). A negative result must be combined with clinical observations, patient history, and epidemiological information. The expected result is Negative.  Fact Sheet for Patients:  BloggerCourse.com  Fact Sheet for Healthcare Providers:   SeriousBroker.it  This test is no t yet approved or cleared by the [place] FDA and  has been authorized for detection and/or diagnosis of SARS-CoV-2 by FDA under an Emergency Use Authorization (EUA). This EUA will remain  in effect (meaning this test can be used) for the duration of the COVID-19 declaration under Section 564(b)(1) of the Act, 21 U.S.C.section 360bbb-3(b)(1), unless the authorization is terminated  or revoked sooner.       Influenza A by PCR NEGATIVE NEGATIVE Final   Influenza B by PCR NEGATIVE NEGATIVE Final    Comment: (NOTE) The Xpert Xpress SARS-CoV-2/FLU/RSV plus assay is intended as an aid in the diagnosis of influenza from Nasopharyngeal swab specimens and should not be used as a sole basis for treatment. Nasal washings and aspirates are unacceptable for Xpert Xpress SARS-CoV-2/FLU/RSV testing.  Fact Sheet for Patients: BloggerCourse.com  Fact Sheet for Healthcare Providers: SeriousBroker.it  This  test is not yet approved or cleared by the [place] and has been authorized for detection and/or diagnosis of SARS-CoV-2 by FDA under an Emergency Use Authorization (EUA). This EUA will remain in effect (meaning this test can be used) for the duration of the COVID-19 declaration under Section 564(b)(1) of the Act, 21 U.S.C. section 360bbb-3(b)(1), unless the authorization is terminated or revoked.  Performed at [facility], [address] [place], [place] [address]   Culture, blood (single) w Reflex to ID Panel     Status: None (Preliminary result)   Collection Time: 10/04/20  5:15 AM   Specimen: BLOOD  Result Value Ref Range Status   Specimen Description BLOOD LEFT ANTECUBITAL  Final   Special Requests   Final    BOTTLES DRAWN AEROBIC AND ANAEROBIC Blood Culture results may not be optimal due to an excessive volume of blood received in culture bottles    Culture   Final    NO GROWTH 1 DAY Performed at [facility], [address] [place], [place] [address]    Report Status PENDING  Incomplete     Labs: BNP (last 3 results) Recent Labs    05/14/20 1249  BNP 534.9*   Basic Metabolic Panel: Recent Labs  Lab 10/03/20 2051 10/04/20 0515  NA 139 137  K 4.9 4.0  CL 102 102  CO2 24 26  GLUCOSE 142* 130*  BUN 14 14  CREATININE 0.92 0.74  CALCIUM 9.8 9.3  MG 2.0 2.2   Liver Function Tests: Recent Labs  Lab 10/03/20 2051 10/04/20 0515  AST 41 26  ALT 22 18  ALKPHOS 61 51  BILITOT 0.5 0.5  PROT 8.3* 6.9  ALBUMIN 4.4 3.5   Recent Labs  Lab 10/03/20 2051  LIPASE 31   Recent Labs  Lab 10/04/20 0515  AMMONIA 25   CBC: Recent Labs  Lab 10/03/20 2051 10/04/20 0515  WBC 16.8* 14.3*  NEUTROABS 15.3* 10.3*  HGB 15.0 13.2  HCT 45.9 39.6  MCV 93.5 91.2  PLT 245 234   Cardiac Enzymes: Recent Labs  Lab 10/03/20 2051  CKTOTAL 80   BNP: Invalid input(s): POCBNP CBG: No results for input(s): GLUCAP in the last 168 hours. D-Dimer No results for input(s): DDIMER in the last 72 hours. Hgb A1c Recent Labs    10/04/20 1131  HGBA1C 6.5*   Lipid Profile Recent Labs    10/04/20 1131 10/05/20 0408  CHOL  --  208*  HDL  --  44  LDLCALC  --  301*  TRIG  --  159*  CHOLHDL  --  4.7  LDLDIRECT 139.3*  --    Thyroid function studies Recent Labs    10/03/20 2051  TSH 0.791   Anemia work up Recent Labs    10/04/20 1131  VITAMINB12 242   Urinalysis    Component Value Date/Time   COLORURINE YELLOW (A) 10/03/2020 2232   APPEARANCEUR CLEAR (A) 10/03/2020 2232   LABSPEC 1.021 10/03/2020 2232   PHURINE 5.0 10/03/2020 2232   GLUCOSEU NEGATIVE 10/03/2020 2232   HGBUR NEGATIVE 10/03/2020 2232   BILIRUBINUR NEGATIVE 10/03/2020 2232   KETONESUR 5 (A) 10/03/2020 2232   PROTEINUR NEGATIVE 10/03/2020 2232   NITRITE NEGATIVE 10/03/2020 2232   LEUKOCYTESUR NEGATIVE 10/03/2020 2232   Sepsis  Labs Invalid input(s): PROCALCITONIN,  WBC,  LACTICIDVEN Microbiology Recent Results (from the past 240 hour(s))  Resp Panel by RT-PCR (Flu A&B, Covid) Nasopharyngeal Swab     Status: None  Collection Time: 10/03/20 10:32 PM   Specimen: Nasopharyngeal Swab; Nasopharyngeal(NP) swabs in vial transport medium  Result Value Ref Range Status   SARS Coronavirus 2 by RT PCR NEGATIVE NEGATIVE Final    Comment: (NOTE) SARS-CoV-2 target nucleic acids are NOT DETECTED.  The SARS-CoV-2 RNA is generally detectable in upper respiratory specimens during the acute phase of infection. The lowest concentration of SARS-CoV-2 viral copies this assay can detect is 138 copies/mL. A negative result does not preclude SARS-Cov-2 infection and should not be used as the sole basis for treatment or other patient management decisions. A negative result may occur with  improper specimen collection/handling, submission of specimen other than nasopharyngeal swab, presence of viral mutation(s) within the areas targeted by this assay, and inadequate number of viral copies(<138 copies/mL). A negative result must be combined with clinical observations, patient history, and epidemiological information. The expected result is Negative.  Fact Sheet for Patients:  BloggerCourse.com  Fact Sheet for Healthcare Providers:  SeriousBroker.it  This test is no t yet approved or cleared by the [place] FDA and  has been authorized for detection and/or diagnosis of SARS-CoV-2 by FDA under an Emergency Use Authorization (EUA). This EUA will remain  in effect (meaning this test can be used) for the duration of the COVID-19 declaration under Section 564(b)(1) of the Act, 21 U.S.C.section 360bbb-3(b)(1), unless the authorization is terminated  or revoked sooner.       Influenza A by PCR NEGATIVE NEGATIVE Final   Influenza B by PCR NEGATIVE NEGATIVE Final    Comment:  (NOTE) The Xpert Xpress SARS-CoV-2/FLU/RSV plus assay is intended as an aid in the diagnosis of influenza from Nasopharyngeal swab specimens and should not be used as a sole basis for treatment. Nasal washings and aspirates are unacceptable for Xpert Xpress SARS-CoV-2/FLU/RSV testing.  Fact Sheet for Patients: BloggerCourse.com  Fact Sheet for Healthcare Providers: SeriousBroker.it  This test is not yet approved or cleared by the [place] FDA and has been authorized for detection and/or diagnosis of SARS-CoV-2 by FDA under an Emergency Use Authorization (EUA). This EUA will remain in effect (meaning this test can be used) for the duration of the COVID-19 declaration under Section 564(b)(1) of the Act, 21 U.S.C. section 360bbb-3(b)(1), unless the authorization is terminated or revoked.  Performed at [facility], [address] [place], [place] [address]   Culture, blood (single) w Reflex to ID Panel     Status: None (Preliminary result)   Collection Time: 10/04/20  5:15 AM   Specimen: BLOOD  Result Value Ref Range Status   Specimen Description BLOOD LEFT ANTECUBITAL  Final   Special Requests   Final    BOTTLES DRAWN AEROBIC AND ANAEROBIC Blood Culture results may not be optimal due to an excessive volume of blood received in culture bottles   Culture   Final    NO GROWTH 1 DAY Performed at [facility], [address], [place], [place] [address]    Report Status PENDING  Incomplete     Time coordinating discharge: Over 30 minutes  SIGNED:   Lynn Ito, MD  [facility] 10/05/2020, 10:34 AM Pager   If 7PM-7AM, please contact night-coverage [email or web address] Password [email or web address]

## 2020-10-06 LAB — CALCIUM, IONIZED: Calcium, Ionized, Serum: 5.2 mg/dL (ref 4.5–5.6)

## 2020-10-11 LAB — CULTURE, BLOOD (SINGLE): Culture: NO GROWTH

## 2020-10-11 LAB — METHYLMALONIC ACID, SERUM: Methylmalonic Acid, Quantitative: 134 nmol/L (ref 0–378)

## 2020-11-08 ENCOUNTER — Telehealth: Payer: Self-pay | Admitting: Family

## 2020-11-08 ENCOUNTER — Telehealth: Payer: Self-pay | Admitting: Cardiovascular Disease

## 2020-11-08 NOTE — Telephone Encounter (Signed)
No answer no voice mail  

## 2020-11-08 NOTE — Telephone Encounter (Signed)
Per Dr. Mariah Milling patient due for appt .   Attempted to schedule.  No ans no vm .

## 2020-11-08 NOTE — Telephone Encounter (Signed)
-----   Message from Antonieta Iba, MD sent at 11/08/2020  1:57 PM EDT ----- Can we schedule him for follow-up He called CHF clinic, felt his chest was hurting,, post CABG issues Was supposed to have followed up in March with Arida or cadence Thx TG

## 2020-11-08 NOTE — Telephone Encounter (Signed)
Received message from this patient saying he needed to r/s his appointment because he's been having difficulty with transportation and that he feels like he "has broken bones in my sternum from recent surgery".  Upon review of appointments, he has never had an appointment scheduled in the [facility] but has seen [facility] cardiology. Attempted to call patient to give him their office number but his voicemail has not been set up. Attempted to call his mother and daughter as they are listed as emergency contacts and neither had voice mails set up or were full.   This provider is out of town so this message was forwarded to [facility] cardiology who said that he is overdue for follow-up with them and that their office will call the patient.

## 2020-11-22 ENCOUNTER — Emergency Department: Payer: Medicaid Other

## 2020-11-22 ENCOUNTER — Other Ambulatory Visit: Payer: Self-pay

## 2020-11-22 DIAGNOSIS — Z951 Presence of aortocoronary bypass graft: Secondary | ICD-10-CM | POA: Insufficient documentation

## 2020-11-22 DIAGNOSIS — I2510 Atherosclerotic heart disease of native coronary artery without angina pectoris: Secondary | ICD-10-CM | POA: Insufficient documentation

## 2020-11-22 DIAGNOSIS — R200 Anesthesia of skin: Secondary | ICD-10-CM | POA: Insufficient documentation

## 2020-11-22 DIAGNOSIS — F17210 Nicotine dependence, cigarettes, uncomplicated: Secondary | ICD-10-CM | POA: Diagnosis not present

## 2020-11-22 DIAGNOSIS — I5042 Chronic combined systolic (congestive) and diastolic (congestive) heart failure: Secondary | ICD-10-CM | POA: Diagnosis not present

## 2020-11-22 DIAGNOSIS — R079 Chest pain, unspecified: Secondary | ICD-10-CM | POA: Diagnosis not present

## 2020-11-22 DIAGNOSIS — I110 Hypertensive heart disease with heart failure: Secondary | ICD-10-CM | POA: Diagnosis not present

## 2020-11-22 DIAGNOSIS — Z4800 Encounter for change or removal of nonsurgical wound dressing: Secondary | ICD-10-CM | POA: Diagnosis not present

## 2020-11-22 DIAGNOSIS — Z23 Encounter for immunization: Secondary | ICD-10-CM | POA: Diagnosis not present

## 2020-11-22 LAB — CBC
HCT: 42 % (ref 39.0–52.0)
Hemoglobin: 13.8 g/dL (ref 13.0–17.0)
MCH: 31.9 pg (ref 26.0–34.0)
MCHC: 32.9 g/dL (ref 30.0–36.0)
MCV: 97 fL (ref 80.0–100.0)
Platelets: 234 10*3/uL (ref 150–400)
RBC: 4.33 MIL/uL (ref 4.22–5.81)
RDW: 14.6 % (ref 11.5–15.5)
WBC: 8.3 10*3/uL (ref 4.0–10.5)
nRBC: 0 % (ref 0.0–0.2)

## 2020-11-22 NOTE — ED Triage Notes (Signed)
Pt presents via EMS c/o chest pain x1.5 weeks constantly. Reports taken nitro and ASA PTA. Reports some improvement in pain. PMH CABG and CVA per pt report. Reports left arm numbness started apx 5hrs ago.

## 2020-11-23 ENCOUNTER — Encounter: Payer: Self-pay | Admitting: Radiology

## 2020-11-23 ENCOUNTER — Emergency Department
Admission: EM | Admit: 2020-11-23 | Discharge: 2020-11-23 | Disposition: A | Discharge status: Home / Self Care | Payer: Medicaid Other | Attending: Emergency Medicine | Admitting: Emergency Medicine

## 2020-11-23 ENCOUNTER — Emergency Department: Payer: Medicaid Other

## 2020-11-23 DIAGNOSIS — Z5189 Encounter for other specified aftercare: Secondary | ICD-10-CM

## 2020-11-23 DIAGNOSIS — R079 Chest pain, unspecified: Secondary | ICD-10-CM

## 2020-11-23 LAB — BASIC METABOLIC PANEL
Anion gap: 9 (ref 5–15)
BUN: 13 mg/dL (ref 6–20)
CO2: 23 mmol/L (ref 22–32)
Calcium: 9.2 mg/dL (ref 8.9–10.3)
Chloride: 108 mmol/L (ref 98–111)
Creatinine, Ser: 0.88 mg/dL (ref 0.61–1.24)
GFR, Estimated: >60 mL/min (ref >60)
Glucose, Bld: 113 mg/dL — ABNORMAL HIGH (ref 70–99)
Potassium: 4 mmol/L (ref 3.5–5.1)
Sodium: 140 mmol/L (ref 135–145)

## 2020-11-23 LAB — TROPONIN I (HIGH SENSITIVITY)
Troponin I (High Sensitivity): 13 ng/L (ref <18)
Troponin I (High Sensitivity): 13 ng/L (ref <18)

## 2020-11-23 MED ORDER — MORPHINE SULFATE (PF) 4 MG/ML IV SOLN
4.0000 mg | Freq: Once | INTRAVENOUS | Status: AC
  Administered 2020-11-23: 4 mg via INTRAVENOUS
  Filled 2020-11-23: qty 1

## 2020-11-23 MED ORDER — TETANUS-DIPHTH-ACELL PERTUSSIS 5-2.5-18.5 LF-MCG/0.5 IM SUSY
0.5000 mL | PREFILLED_SYRINGE | Freq: Once | INTRAMUSCULAR | Status: AC
  Administered 2020-11-23: 0.5 mL via INTRAMUSCULAR
  Filled 2020-11-23: qty 0.5

## 2020-11-23 MED ORDER — OXYCODONE-ACETAMINOPHEN 5-325 MG PO TABS: 1.0000 | ORAL_TABLET | ORAL | 0 refills | Status: DC | PRN

## 2020-11-23 NOTE — Discharge Instructions (Signed)
Please call your cardiologist's office as they have been trying to reach you to schedule an appointment.  You may take Percocet as needed for pain.  Your tetanus has been updated and will be good for 10 years.  Return to the ER for worsening symptoms, persistent vomiting, difficulty breathing or other concerns.

## 2020-11-23 NOTE — ED Provider Notes (Addendum)
[facility] Emergency Department Provider Note   ____________________________________________   Event Date/Time   First MD Initiated Contact with Patient 11/23/20 [phone]     (approximate)  I have reviewed the triage vital signs and the nursing notes.   HISTORY  Chief Complaint Chest Pain    HPI Matthew Tapia is a 55 y.o. male brought to the ED via EMS from home with a chief complaint of chest pain.  Patient has a history of polysubstance abuse, CAD status post CABG, CHF who reports chest pain for 1.5 to 2 weeks.  Constant midsternal pain, nonradiating.  Pain not associated with diaphoresis, palpitations, nausea/vomiting or dizziness.  Reports transient left arm numbness last evening, resolved currently.  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  Not currently taking any of his medications secondary to financial trouble.  States he does not have a telephone so unaware of cardiology trying to contact him to schedule an appointment.  Denies fever, cough, abdominal pain, diarrhea.  Denies illicit drug use.  EMS gave 4 baby aspirin and nitroglycerin spray prior to arrival.     Past Medical History:  Diagnosis Date   Anxiety    CAD, multiple vessel    Dyslipidemia, goal LDL below 70    Former smoker    Quit 2021   HFrEF (heart failure with reduced ejection fraction) (HCC)    Polysubstance abuse (HCC)    UDS positive for cocaine, amphetamines, benzos 05/15/20   ST elevation myocardial infarction (STEMI) of inferior wall [facility])     Patient Active Problem List   Diagnosis Date Noted   Lactic acidosis 10/04/2020   Dehydration 10/04/2020   Chronic combined systolic and diastolic CHF (congestive heart failure) (HCC) 10/04/2020   Acute encephalopathy 10/03/2020   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   CAD (coronary artery disease) 05/17/2020   HFrEF (heart failure with reduced ejection fraction) (HCC)    CAD, 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   Polysubstance abuse (HCC) 05/16/2020    Cardiomyopathy, ischemic 05/16/2020   Hyperlipidemia LDL goal <70 05/16/2020   Essential hypertension 05/16/2020   Elevated hemoglobin A1c 05/16/2020   Acute ST elevation myocardial infarction (STEMI) of inferior wall (HCC) 05/14/2020   Tobacco abuse 05/14/2020   Leukocytosis 05/14/2020   Anxiety 05/14/2020   Cough 05/14/2020    Past Surgical History:  Procedure Laterality Date   CORONARY ANGIOGRAPHY N/A 05/20/2020   Procedure: CORONARY ANGIOGRAPHY;  Surgeon: Kathleene Hazel, MD;  Location: [facility] INVASIVE CV LAB;  Service: Cardiovascular;  Laterality: N/A;   CORONARY ARTERY BYPASS GRAFT N/A 05/21/2020   Procedure: CORONARY ARTERY BYPASS GRAFTING TIMES FOUR USING LEFT INTERNAL MAMMARY ARTERY  AND ENDOSCOPICALLY HARVESTED RIGHT GREATER SAPHANOUS VEIN.;  Surgeon: Corliss Skains, MD;  Location: [facility] OR;  Service: Open Heart Surgery;  Laterality: N/A;  flow track   CORONARY THROMBECTOMY N/A 05/20/2020   Procedure: Coronary Thrombectomy;  Surgeon: Kathleene Hazel, MD;  Location: [facility] INVASIVE CV LAB;  Service: Cardiovascular;  Laterality: N/A;   CORONARY/GRAFT ACUTE MI REVASCULARIZATION N/A 05/14/2020   Procedure: Coronary/Graft Acute MI Revascularization;  Surgeon: Iran Ouch, MD;  Location: [facility] INVASIVE CV LAB;  Service: Cardiovascular;  Laterality: N/A;   CORONARY/GRAFT ACUTE MI REVASCULARIZATION N/A 05/20/2020   Procedure: Coronary/Graft Acute MI Revascularization;  Surgeon: Kathleene Hazel, MD;  Location: [facility] INVASIVE CV LAB;  Service: Cardiovascular;  Laterality: N/A;   ENDOVEIN HARVEST OF GREATER SAPHENOUS VEIN Right 05/21/2020   Procedure: ENDOVEIN HARVEST OF RIGHT GREATER SAPHENOUS VEIN;  Surgeon: Cliffton Asters,  Eliezer Lofts, MD;  Location: [facility] OR;  Service: Open Heart Surgery;  Laterality: Right;   INTRAVASCULAR PRESSURE WIRE/FFR STUDY N/A 05/16/2020   Procedure: INTRAVASCULAR PRESSURE WIRE/FFR STUDY;  Surgeon: Iran Ouch, MD;  Location: [facility] INVASIVE CV  LAB;  Service: Cardiovascular;  Laterality: N/A;   LEFT HEART CATH AND CORONARY ANGIOGRAPHY N/A 05/14/2020   Procedure: LEFT HEART CATH AND CORONARY ANGIOGRAPHY;  Surgeon: Iran Ouch, MD;  Location: [facility] INVASIVE CV LAB;  Service: Cardiovascular;  Laterality: N/A;   LEFT HEART CATH AND CORONARY ANGIOGRAPHY N/A 05/16/2020   Procedure: LEFT HEART CATH AND CORONARY ANGIOGRAPHY;  Surgeon: Iran Ouch, MD;  Location: [facility] INVASIVE CV LAB;  Service: Cardiovascular;  Laterality: N/A;   SHOULDER SURGERY     TEE WITHOUT CARDIOVERSION N/A 05/21/2020   Procedure: TRANSESOPHAGEAL ECHOCARDIOGRAM (TEE);  Surgeon: Corliss Skains, MD;  Location: [facility] OR;  Service: Open Heart Surgery;  Laterality: N/A;    Prior to Admission medications   Medication Sig Start Date End Date Taking? Authorizing Provider  oxyCODONE-acetaminophen (PERCOCET/ROXICET) 5-325 MG tablet Take 1 tablet by mouth every 4 (four) hours as needed for severe pain. 11/23/20  Yes Irean Hong, MD  atorvastatin (LIPITOR) 80 MG tablet Take 1 tablet (80 mg total) by mouth daily. Patient not taking: Reported on 10/04/2020 05/26/20   Ardelle Balls, PA-C    Allergies Patient has no known allergies.  Family History  Problem Relation Age of Onset   Heart attack Father     Social History Social History   Tobacco Use   Smoking status: Some Days    Pack years: 0.00    Types: Cigarettes   Smokeless tobacco: Never   Tobacco comments:    1-2 cigarettes "every now and then"  Substance Use Topics   Alcohol use: No   Drug use: Never    Review of Systems  Constitutional: No fever/chills Eyes: No visual changes. ENT: No sore throat. Cardiovascular: Positive for chest pain. Respiratory: Denies shortness of breath. Gastrointestinal: No abdominal pain.  No nausea, no vomiting.  No diarrhea.  No constipation. Genitourinary: Negative for dysuria. Musculoskeletal: Negative for back pain. Skin: Negative for  rash. Neurological: Negative for headaches, focal weakness or numbness.   ____________________________________________   PHYSICAL EXAM:  VITAL SIGNS: ED Triage Vitals  Enc Vitals Group     BP 11/22/20 2255 110/72     Pulse Rate 11/22/20 2255 89     Resp 11/22/20 2255 (!) 26     Temp 11/22/20 2255 98.4 F (36.9 C)     Temp Source 11/22/20 2255 Oral     SpO2 11/22/20 2255 95 %     Weight --      Height --      Head Circumference --      Peak Flow --      Pain Score 11/22/20 2257 5     Pain Loc --      Pain Edu? --      Excl. in GC? --     Constitutional: Alert and oriented. Well appearing and in no acute distress. Eyes: Conjunctivae are normal. PERRL. EOMI. Head: Atraumatic. Nose: No congestion/rhinnorhea. Mouth/Throat: Mucous membranes are moist.   Neck: No stridor.   Cardiovascular: Normal rate, regular rhythm. Grossly normal heart sounds.  Good peripheral circulation. Respiratory: Normal respiratory effort.  No retractions. Lungs CTAB.  Anterior chest tender to palpation. Gastrointestinal: Soft and nontender to light or deep palpation. No distention. No abdominal bruits. No CVA tenderness.  Musculoskeletal: No lower extremity tenderness nor edema.  No joint effusions. Neurologic: Alert and oriented x3.  CN II to XII grossly intact.  Normal speech and language.  5/5 motor strength and sensation all extremities.  No gross focal neurologic deficits are appreciated. MAEx4. No gait instability. Skin:  Skin is warm, dry and intact. No rash noted. Psychiatric: Mood and affect are normal. Speech and behavior are normal.  ____________________________________________   LABS (all labs ordered are listed, but only abnormal results are displayed)  Labs Reviewed  BASIC METABOLIC PANEL - Abnormal; Notable for the following components:      Result Value   Glucose, Bld 113 (*)    All other components within normal limits  CBC  TROPONIN I (HIGH SENSITIVITY)  TROPONIN I (HIGH  SENSITIVITY)   ____________________________________________  EKG  ED ECG REPORT I, Audrey Eller J, the attending physician, personally viewed and interpreted this ECG.   Date: 11/23/2020  EKG Time: 2248  Rate: 93  Rhythm: Normal sinus rhythm  Axis: Normal  Intervals:none  ST&T Change: Nonspecific  ____________________________________________  RADIOLOGY I, Kymani Shimabukuro J, personally viewed and evaluated these images (plain radiographs) as part of my medical decision making, as well as reviewing the written report by the radiologist.  ED MD interpretation: Stable CT head, unremarkable chest x-ray; no foreign body in foot  Official radiology report(s): DG Chest 2 View  Result Date: 11/22/2020 CLINICAL DATA:  Chest pain. EXAM: CHEST - 2 VIEW COMPARISON:  Oct 03, 2020 FINDINGS: Multiple sternal wires and vascular clips are seen. There is no evidence of acute infiltrate, pleural effusion or pneumothorax. The heart size and mediastinal contours are within normal limits. The visualized skeletal structures are unremarkable. IMPRESSION: Stable exam without active cardiopulmonary disease. Electronically Signed   By: Aram Candela M.D.   On: 11/22/2020 23:18   CT Head Wo Contrast  Result Date: 11/22/2020 CLINICAL DATA:  Neuro deficit, acute, stroke suspected Left arm numbness. EXAM: CT HEAD WITHOUT CONTRAST TECHNIQUE: Contiguous axial images were obtained from the base of the skull through the vertex without intravenous contrast. COMPARISON:  Head CT 10/03/2020, MRI 10/04/2020 FINDINGS: Brain: No acute hemorrhage. There is mild encephalomalacia in the left temporal lobe at site of prior infarcts on MRI. No CT correlate for many of the additional punctate infarcts on prior MRI. No CT evidence of new ischemia. No subdural or extra-axial collection. No hydrocephalus. No mass effect or midline shift. Vascular: Similar hyperdense thrombus within distal M2 left MCA vessel, series 4, image 40, that is  unchanged from prior exam. Skull: No fracture or focal lesion. Sinuses/Orbits: Paranasal sinuses and mastoid air cells are clear. The visualized orbits are unremarkable. Other: None. IMPRESSION: 1. No acute intracranial abnormality. No CT findings for acute ischemia. 2. Mild encephalomalacia in the left temporal lobe at site of prior infarcts on MRI. No CT correlate for many of the additional punctate infarcts on prior MRI. 3. 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 Electronically Signed   By: Narda Rutherford M.D.   On: 11/22/2020 23:18   DG Foot Complete Right  Result Date: 11/23/2020 CLINICAL DATA:  55 year old male stepped on nail several weeks ago with continued symptoms at the bottom of the foot. Draining pus. Query foreign body. EXAM: RIGHT FOOT COMPLETE - 3+ VIEW COMPARISON:  None. FINDINGS: No radiopaque foreign body identified. No soft tissue gas identified. Normal joint spaces and alignment throughout the right foot. No cortical osteolysis, fracture or dislocation. Bone mineralization is within normal limits.  Mild calcaneus degenerative spurring. IMPRESSION: No radiopaque foreign body or osseous abnormality identified. Electronically Signed   By: Odessa FlemingH  Hall M.D.   On: 11/23/2020 06:43    ____________________________________________   PROCEDURES  Procedure(s) performed (including Critical Care):  Procedures   ____________________________________________   INITIAL IMPRESSION / ASSESSMENT AND PLAN / ED COURSE  As part of my medical decision making, I reviewed the following data within the electronic MEDICAL RECORD NUMBER Nursing notes reviewed and incorporated, Labs reviewed, EKG interpreted, Old chart reviewed, Radiograph reviewed, and Notes from prior ED visits     55 year old male presenting with chest pain. Differential diagnosis includes, but is not limited to, ACS, aortic dissection, pulmonary embolism, cardiac tamponade, pneumothorax, pneumonia, pericarditis,  myocarditis, GI-related causes including esophagitis/gastritis, and musculoskeletal chest wall pain.     Initial EKG and troponin unremarkable.  Will obtain repeat troponin.  Administer IV Morphine for pain.  Will reassess.  Clinical Course as of 11/23/20 0648  Sat Nov 23, 2020  0411 Patient resting comfortably no acute distress.  Updated him of negative repeat troponin.  Asked me to examine the sole of his right foot as he stepped on a rusty nail wearing a flip-flop over 2 weeks ago.  Small scab to sole of right midfoot near heel.  No fluctuance, warmth or erythema.  No evidence of infection.  Will update tetanus, obtain x-ray to evaluate for retained foreign body. [JS]  [phone] X-ray demonstrates no foreign body.  Will discharge home with limited prescription for Percocet to use for musculoskeletal chest pain.  Urged patient to call his cardiologist to make an appointment as they have been trying to call him to no avail.  Strict return precautions given.  Patient verbalizes understanding agrees with plan of care. [JS]    Clinical Course User Index [JS] Irean HongSung, Annissa Andreoni J, MD     ____________________________________________   FINAL CLINICAL IMPRESSION(S) / ED DIAGNOSES  Final diagnoses:  Nonspecific chest pain  Visit for wound check     ED Discharge Orders          Ordered    oxyCODONE-acetaminophen (PERCOCET/ROXICET) 5-325 MG tablet  Every 4 hours PRN        11/23/20 0540             Note:  This document was prepared using Dragon voice recognition software and may include unintentional dictation errors.    Irean HongSung, Jedaiah Rathbun J, MD 11/23/20 [id number]    Irean HongSung, Atalya Dano J, MD 11/23/20 [phone]

## 2020-11-26 NOTE — Telephone Encounter (Signed)
Scheduled next available visit in August with ryan dunn .  Patient states he needs ed fu within 3 days per discharge instructions .  Please review and advise.

## 2020-11-26 NOTE — Telephone Encounter (Signed)
To Dr. Kirke Corin to review. The patient had an ER visit on 11/26/20.  There are no APP visits until early August.  Dr. Kirke Corin has 1 DOD slot on Thursday 12/05/20.   To Dr. Kirke Corin to review and advise if he feels the patient needs to be worked in on his schedule this week or if he wants to add him on on 12/05/20.

## 2020-11-26 NOTE — Telephone Encounter (Signed)
Attempted to schedule no ans no vm  

## 2020-11-27 NOTE — Telephone Encounter (Signed)
Called the patient to offer the appt tomorrow 11/28/20 @ 8:20 am w/Dr. Kirke Corin.  Patient is agreeable with the sooner appt. Patient adv to arrive 10-15 min prior to allow time for check in, pt adv to enter through the medical arts entrance. Patient confirmed date, time, and location of the appt. Patient voiced appreciation for the call.

## 2020-11-27 NOTE — Telephone Encounter (Signed)
I can see him tomorrow at 8:20 in the morning.  Cath conference was canceled for tomorrow.

## 2020-11-27 NOTE — Telephone Encounter (Signed)
Scheduled

## 2020-11-28 ENCOUNTER — Encounter: Payer: Self-pay | Admitting: Cardiovascular Disease

## 2020-11-28 ENCOUNTER — Ambulatory Visit (INDEPENDENT_AMBULATORY_CARE_PROVIDER_SITE_OTHER): Payer: Medicaid Other | Admitting: Cardiovascular Disease

## 2020-11-28 ENCOUNTER — Other Ambulatory Visit: Payer: Self-pay

## 2020-11-28 VITALS — BP 140/90 | HR 90 | Ht 66.0 in | Wt 240.1 lb

## 2020-11-28 DIAGNOSIS — I2510 Atherosclerotic heart disease of native coronary artery without angina pectoris: Secondary | ICD-10-CM | POA: Diagnosis not present

## 2020-11-28 MED ORDER — ASPIRIN EC 81 MG PO TBEC
81.0000 mg | DELAYED_RELEASE_TABLET | Freq: Every day | ORAL | 1 refills | Status: AC
  Filled 2020-11-28: qty 90, 90d supply, fill #0
  Filled 2020-11-28: qty 30, 30d supply, fill #0
  Filled 2021-01-03: qty 30, 30d supply, fill #1
  Filled 2021-02-20: qty 30, 30d supply, fill #2

## 2020-11-28 MED ORDER — OXYCODONE-ACETAMINOPHEN 5-325 MG PO TABS: 1.0000 | ORAL_TABLET | Freq: Four times a day (QID) | ORAL | 0 refills | Status: DC | PRN

## 2020-11-28 MED ORDER — CARVEDILOL 6.25 MG PO TABS
6.2500 mg | ORAL_TABLET | Freq: Two times a day (BID) | ORAL | 1 refills | Status: DC
  Filled 2020-11-28: qty 60, 30d supply, fill #0

## 2020-11-28 MED ORDER — ATORVASTATIN CALCIUM 80 MG PO TABS
80.0000 mg | ORAL_TABLET | Freq: Every day | ORAL | 1 refills | Status: DC
  Filled 2020-11-28: qty 30, 30d supply, fill #0

## 2020-11-28 MED ORDER — LOSARTAN POTASSIUM 25 MG PO TABS
25.0000 mg | ORAL_TABLET | Freq: Every day | ORAL | 1 refills | Status: DC
  Filled 2020-11-28: qty 30, 30d supply, fill #0

## 2020-11-28 NOTE — Patient Instructions (Signed)
Medication Instructions:  Your physician has recommended you make the following change in your medication:   RESUME the following medications:  -Aspirin 81 mg daily -Atorvastatin 80 mg daily -Carvedilol 6.25 mg twice a day -Losartan 25 mg daily  -Medication refills have been sent to [facility] in [place]. They will contact you to collect information to ensure eligibility.  A onetime refill of Oxycodone 5/325 mg to be taken every 6 hours as needed. No further refills will be granted.   *If you need a refill on your cardiac medications before your next appointment, please call your pharmacy*   Lab Work: None ordered  If you have labs (blood work) drawn today and your tests are completely normal, you will receive your results only by: MyChart Message (if you have MyChart) OR A paper copy in the mail If you have any lab test that is abnormal or we need to change your treatment, we will call you to review the results.   Testing/Procedures: None ordered   Follow-Up: At [facility], you and your health needs are our priority.  As part of our continuing mission to provide you with exceptional heart care, we have created designated Provider Care Teams.  These Care Teams include your primary Cardiologist (physician) and Advanced Practice Providers (APPs -  Physician Assistants and Nurse Practitioners) who all work together to provide you with the care you need, when you need it.  We recommend signing up for the patient portal called "MyChart".  Sign up information is provided on this After Visit Summary.  MyChart is used to connect with patients for Virtual Visits (Telemedicine).  Patients are able to view lab/test results, encounter notes, upcoming appointments, etc.  Non-urgent messages can be sent to your provider as well.   To learn more about what you can do with MyChart, go to [email or web address].    Your next appointment:   3 month(s)  The format for your  next appointment:   In Person  Provider:   You may see Lorine Bears, MD or one of the following Advanced Practice Providers on your designated Care Team:   Nicolasa Ducking, NP Eula Listen, PA-C Marisue Ivan, PA-C Cadence T[place], [place] Gillian Shields, NP   Other Instructions N/A

## 2020-11-28 NOTE — Progress Notes (Signed)
Cardiology Office Note   Date:  11/28/2020   ID:  Matthew Tapia, DOB 1966/02/21, MRN [id number]  PCP:  Miki Kins, FNP  Cardiologist:   Lorine Bears, MD   Chief Complaint  Patient presents with   Other    F/u ED chest pain c/o chest pain feels like his "chest is popping". Meds reviewed verbally with pt.      History of Present Illness: Matthew Tapia is a 55 y.o. male who presents for a follow-up visit regarding coronary artery disease. He has known history of substance abuse including cocaine and amphetamine, anxiety, chronic pain, essential hypertension, hyperlipidemia and tobacco use. He was hospitalized in December with inferior ST elevation myocardial infarction with late presentation.  Emergent cardiac catheterization showed thrombotic occlusion of the right coronary artery with massive amount of thrombus throughout the proximal to distal segment with left-to-right collaterals.  In addition, there was high-grade stenosis in the mid left circumflex at the bifurcation of OM 2 and significant proximal LAD disease by FFR.  EF was 30 to 35%.  I performed successful aspiration thrombectomy and balloon angioplasty to the right coronary artery with reestablishment of TIMI II flow.  No stenting was performed on the patient was given Aggrastat infusion for 48 hours as well as dual antiplatelet therapy.  Relook angiography showed significant improvement in the RCA with TIMI-3 flow of the area was still diffuse disease.  The patient was referred for CABG but he reoccluded his RCA before surgery went Aggrastat was held.  Balloon angioplasty was performed again and ultimately the patient underwent CABG. His echocardiogram on presentation showed an EF of 35 to 40%.  Subsequent echo in May showed an EF of 40 to 45%. He was hospitalized in May with mental status change.  MRI showed patchy multifocal acute ischemic infarcts of the posterior left temporoparietal region and left basal ganglia  with associated mild petechiae without frank hemorrhage.  He was also dehydrated and improved with IV fluids.  Urine tox screen was negative. He went to the emergency room recently with chest pain.  His troponin was normal.  The chest pain is at the sternal and there is some popping noise in the lower part of the sternum.  It gets worse with coughing and deep breath. Unfortunately, he lost his job and has not been taking any of his medications as he cannot afford them.  He cut down on tobacco use to one fourth of a pack per day.  He denies any drug use since his CABG.  Past Medical History:  Diagnosis Date   Anxiety    CAD, multiple vessel    Dyslipidemia, goal LDL below 70    Former smoker    Quit 2021   HFrEF (heart failure with reduced ejection fraction) (HCC)    Polysubstance abuse (HCC)    UDS positive for cocaine, amphetamines, benzos 05/15/20   ST elevation myocardial infarction (STEMI) of inferior wall [facility])     Past Surgical History:  Procedure Laterality Date   CORONARY ANGIOGRAPHY N/A 05/20/2020   Procedure: CORONARY ANGIOGRAPHY;  Surgeon: Kathleene Hazel, MD;  Location: [facility] INVASIVE CV LAB;  Service: Cardiovascular;  Laterality: N/A;   CORONARY ARTERY BYPASS GRAFT N/A 05/21/2020   Procedure: CORONARY ARTERY BYPASS GRAFTING TIMES FOUR USING LEFT INTERNAL MAMMARY ARTERY  AND ENDOSCOPICALLY HARVESTED RIGHT GREATER SAPHANOUS VEIN.;  Surgeon: Corliss Skains, MD;  Location: [facility] OR;  Service: Open Heart Surgery;  Laterality: N/A;  flow track  CORONARY THROMBECTOMY N/A 05/20/2020   Procedure: Coronary Thrombectomy;  Surgeon: Kathleene Hazel, MD;  Location: [facility] INVASIVE CV LAB;  Service: Cardiovascular;  Laterality: N/A;   CORONARY/GRAFT ACUTE MI REVASCULARIZATION N/A 05/14/2020   Procedure: Coronary/Graft Acute MI Revascularization;  Surgeon: Iran Ouch, MD;  Location: [facility] INVASIVE CV LAB;  Service: Cardiovascular;  Laterality: N/A;   CORONARY/GRAFT ACUTE MI  REVASCULARIZATION N/A 05/20/2020   Procedure: Coronary/Graft Acute MI Revascularization;  Surgeon: Kathleene Hazel, MD;  Location: [facility]VAS[facility] CV LAB;  Service: Cardiovascular;  Laterality: N/A;   ENDOVEIN HARVEST OF GREATER SAPHENOUS VEIN Right 05/21/2020   Procedure: ENDOVEIN HARVEST OF RIGHT GREATER SAPHENOUS VEIN;  Surgeon: Corliss Skains, MD;  Location: [facility] OR;  Service: Open Heart Surgery;  Laterality: Right;   INTRAVASCULAR PRESSURE WIRE/FFR STUDY N/A 05/16/2020   Procedure: INTRAVASCULAR PRESSURE WIRE/FFR STUDY;  Surgeon: Iran Ouch, MD;  Location: [facility] INVASIVE CV LAB;  Service: Cardiovascular;  Laterality: N/A;   LEFT HEART CATH AND CORONARY ANGIOGRAPHY N/A 05/14/2020   Procedure: LEFT HEART CATH AND CORONARY ANGIOGRAPHY;  Surgeon: Iran Ouch, MD;  Location: [facility] INVASIVE CV LAB;  Service: Cardiovascular;  Laterality: N/A;   LEFT HEART CATH AND CORONARY ANGIOGRAPHY N/A 05/16/2020   Procedure: LEFT HEART CATH AND CORONARY ANGIOGRAPHY;  Surgeon: Iran Ouch, MD;  Location: [facility] INVASIVE CV LAB;  Service: Cardiovascular;  Laterality: N/A;   SHOULDER SURGERY     TEE WITHOUT CARDIOVERSION N/A 05/21/2020   Procedure: TRANSESOPHAGEAL ECHOCARDIOGRAM (TEE);  Surgeon: Corliss Skains, MD;  Location[facility] OR;  Service: Open Heart Surgery;  Laterality: N/A;     Current Outpatient Medications  Medication Sig Dispense Refill   aspirin EC 81 MG tablet Take 1 tablet (81 mg total) by once mouth daily. Swallow whole. 90 tablet 1   atorvastatin (LIPITOR) 80 MG tablet Take 1 tablet (80 mg total) by mouth once daily. 90 tablet 1   carvedilol (COREG) 6.25 MG tablet Take 1 tablet (6.25 mg total) by mouth 2 (two) times daily. 180 tablet 1   losartan (COZAAR) 25 MG tablet Take 1 tablet (25 mg total) by mouth once daily. 90 tablet 1   oxyCODONE-acetaminophen (PERCOCET/ROXICET) 5-325 MG tablet Take 1 tablet by mouth every 6 (six) hours as needed for severe pain. 15 tablet 0    No current facility-administered medications for this visit.    Allergies:   Patient has no known allergies.    Social History:  The patient  reports that he has been smoking cigarettes. He has a 10.50 pack-year smoking history. He has never used smokeless tobacco. He reports that he does not drink alcohol and does not use drugs.   Family History:  The patient's family history includes Heart attack in his father.    ROS:  Please see the history of present illness.   Otherwise, review of systems are positive for none.   All other systems are reviewed and negative.    PHYSICAL EXAM: VS:  BP 140/90 (BP Location: Left Arm, Patient Position: Sitting, Cuff Size: Normal)   Pulse 90   Ht 5\' 6"  (1.676 m)   Wt 240 lb 2 oz (108.9 kg)   SpO2 98%   BMI 38.76 kg/m  , BMI Body mass index is 38.76 kg/m. GEN: Well nourished, well developed, in no acute distress  HEENT: normal  Neck: no JVD, carotid bruits, or masses Cardiac: RRR; no murmurs, rubs, or gallops,no edema  Respiratory:  clear to auscultation bilaterally, normal work of  breathing GI: soft, nontender, nondistended, + BS MS: no deformity or atrophy  Skin: warm and dry, no rash Neuro:  Strength and sensation are intact Psych: euthymic mood, full affect   EKG:  EKG is ordered today. The ekg ordered today demonstrates sinus rhythm with old inferior infarct.   Recent Labs: 05/14/2020: B Natriuretic Peptide 534.9 10/03/2020: TSH 0.791 10/04/2020: ALT 18; Magnesium 2.2 11/22/2020: BUN 13; Creatinine, Ser 0.88; Hemoglobin 13.8; Platelets 234; Potassium 4.0; Sodium 140    Lipid Panel    Component Value Date/Time   CHOL 208 (H) 10/05/2020 0408   TRIG 159 (H) 10/05/2020 0408   HDL 44 10/05/2020 0408   CHOLHDL 4.7 10/05/2020 0408   VLDL 32 10/05/2020 0408   LDLCALC 132 (H) 10/05/2020 0408   LDLDIRECT 139.3 (H) 10/04/2020 1131      Wt Readings from Last 3 Encounters:  11/28/20 240 lb 2 oz (108.9 kg)  10/05/20 225 lb 5 oz  (102.2 kg)  07/26/20 216 lb (98 kg)       No flowsheet data found.    ASSESSMENT AND PLAN:  1.  Coronary artery disease involving native coronary arteries: No convincing symptoms of angina.  His current chest pain is clearly musculoskeletal.  I recommend continuing medical therapy.  We have to resume his heart medications but he is limited by finances.  Will refer him to medication management for at least the short-term.  2.  Chronic systolic heart failure due to ischemic cardiomyopathy: He appears to be euvolemic.  I am going to start him on small dose carvedilol and losartan.  Ultimately, we will consider switching him to [facility].  3.  Hyperlipidemia: I resumed atorvastatin 80 mg daily.  4.  Musculoskeletal chest pain: This seems to be due to previous CABG.  I am not entirely sure his sternum has healed completely.  This seems to be aggravated by his chronic cough.  I asked him to start using cough syrup.  In addition, I agreed to give him 1 refill on oxycodone.  I gave him 15 tablets and instructed him that I will not be able to give any further refills of this.    5.  Recent stroke: Stable at this time.  The patient has multiple chronic severe medical conditions.  I encouraged him to apply for disability and Medicaid.    Disposition:   FU in 3 months.  Signed,  Lorine Bears, MD  11/28/2020 1:09 PM    [facility]

## 2020-11-29 ENCOUNTER — Other Ambulatory Visit: Payer: Self-pay

## 2020-12-09 ENCOUNTER — Telehealth: Payer: Self-pay | Admitting: Cardiovascular Disease

## 2020-12-09 DIAGNOSIS — Z951 Presence of aortocoronary bypass graft: Secondary | ICD-10-CM

## 2020-12-09 NOTE — Telephone Encounter (Signed)
Spoke with the patient. Patient sts that he is having the sam pain he described during his appt with Dr. Kirke Corin on 11/28/20. He denies exertional angina or sob. Pt sts that he is scheduled to see his pcp later this week but is needing something for pain until then. Adv the patient that he was given an Rx for pain medication as a courtesy and was advised that there would be no refills granted. Adv the patient that the message has been sent to Dr. Kirke Corin and I will call back with his response when available.

## 2020-12-09 NOTE — Telephone Encounter (Signed)
Pt c/o of Chest Pain: STAT if CP now or developed within 24 hours  1. Are you having CP right now? yes  2. Are you experiencing any other symptoms (ex. SOB, nausea, vomiting, sweating)? States his chest pops like popcorn constantly and cause significant pain   3. How long have you been experiencing CP? 2.5 weeks. Patient went to ED 2 weeks ago and was told he is healing but patient is unable to handle this pain  4. Is your CP continuous or coming and going? Continuous, worse at certain times. Coughing causes pain  5. Have you taken Nitroglycerin?   Patient is curious if there is anything for pain that can be prescribed for this  Please advise  ?

## 2020-12-10 NOTE — Telephone Encounter (Signed)
Patient calling to check on status Transferred to [facility]

## 2020-12-10 NOTE — Telephone Encounter (Signed)
Spoke with patient. Advised the patient that I have talked with Dr. Kirke Corin. Advised him that we could not provide him any refills for narcotic pain medication. Offered to place a referral to pain management clinic, pt declined and would rather see his pcp.  Encouraged the patient to f/u with his pcp asap for further pain management. Patient adv to contact our office if cardiac symptoms develop. Patient verbalized understanding.

## 2020-12-10 NOTE — Telephone Encounter (Signed)
Patient calling  States his heart is still causing him a lot of concern Heart is popping like popcorn and has a bulge on his heart that when it pops back in hurts terribly Please call to discuss

## 2020-12-11 NOTE — Telephone Encounter (Signed)
Urgent referral to [facility] pain management clinic placed as requested.

## 2020-12-11 NOTE — Addendum Note (Signed)
Addended by: Jarvis Newcomer on: 12/11/2020 11:10 AM   Modules accepted: Orders

## 2020-12-11 NOTE — Telephone Encounter (Signed)
Patient calling in stating that his PCP refused to see him due to him not having the money to pay his Copay. Patient is calling back now to request the referral to pain management  Please advise

## 2020-12-12 NOTE — Telephone Encounter (Signed)
Secure chat sent by Dr. Kirke Corin to Dr. Cliffton Asters regarding this patient. Alycia Rossetti, RN with [facility] will update Dr. Cliffton Asters and schedule the patient to be seen there next week.

## 2020-12-12 NOTE — Telephone Encounter (Signed)
Matthew Pesa, RN Per Olegario Messier at pain clinic Patient denied due to high risk patient and dr. Cherylann Ratel recommends [facility] or [facility] where psych can treat as well.     [facility] [phone]  Spoke with Judeth Cornfield in referrals.  Next available in October pending review.  Most non [facility] referrals denied and high risk denial is probable.  Suggestion from scott would be to contact someone else that deals with high risk patients or have surgeon temporarily manage pain medication as this is [facility] standard with post op pain issues.    [facility] [phone]  Spoke with Olegario Messier  Can fax referral to [phone] but review for scheduling is a 10-14 dayprocess and the clinic does not prescribe opioids.

## 2020-12-16 ENCOUNTER — Other Ambulatory Visit: Payer: Self-pay | Admitting: *Deleted

## 2020-12-16 DIAGNOSIS — R0789 Other chest pain: Secondary | ICD-10-CM

## 2020-12-16 DIAGNOSIS — Z951 Presence of aortocoronary bypass graft: Secondary | ICD-10-CM

## 2020-12-16 NOTE — Progress Notes (Unsigned)
che

## 2020-12-17 ENCOUNTER — Ambulatory Visit (INDEPENDENT_AMBULATORY_CARE_PROVIDER_SITE_OTHER): Payer: Medicaid Other | Admitting: Thoracic Surgery (Cardiothoracic Vascular Surgery)

## 2020-12-17 ENCOUNTER — Encounter: Payer: Self-pay | Admitting: Thoracic Surgery (Cardiothoracic Vascular Surgery)

## 2020-12-17 ENCOUNTER — Ambulatory Visit
Admission: RE | Admit: 2020-12-17 | Discharge: 2020-12-17 | Disposition: A | Discharge status: Home / Self Care | Payer: Self-pay | Source: Ambulatory Visit | Attending: Thoracic Surgery (Cardiothoracic Vascular Surgery) | Admitting: Thoracic Surgery (Cardiothoracic Vascular Surgery)

## 2020-12-17 ENCOUNTER — Other Ambulatory Visit: Payer: Self-pay

## 2020-12-17 VITALS — BP 138/80 | HR 87 | Resp 20 | Ht 66.0 in | Wt 240.0 lb

## 2020-12-17 DIAGNOSIS — Z951 Presence of aortocoronary bypass graft: Secondary | ICD-10-CM

## 2020-12-17 DIAGNOSIS — R0789 Other chest pain: Secondary | ICD-10-CM | POA: Diagnosis not present

## 2020-12-17 NOTE — Progress Notes (Signed)
     [address]       [place] [address]             [phone]       Matthew Tapia comes in to discuss sternal pain that started about 3 weeks ago.  He was last seen in March 2022 where he was cleared for cardiac rehab after undergoing coronary artery revascularization.  He has had multiple issues since being discharged from clinic which include narcotics abuse, along with medical noncompliance.  He no longer has a primary care physician and does not receive that his medications or been treating his diabetes.  In May of this year he was admitted to the hospital after sustaining a cerebrovascular event.  He has some residual visual field defects, but no motor defects.  He has been complaining of some clicking sensation and pain along his sternum for the past 3 weeks.  He is referred back to us for further evaluation.  Vitals:   12/17/20 1445  BP: 138/80  Pulse: 87  Resp: 20  SpO2: 97%   Alert NAD Regular rate and rhythm Easy work of breathing Incision is well-healed There is palpable clicking with cough as well as deep breaths along his sternum.  CT scan was reviewed.  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  Final report is pending.  On review of his labs from May his hemoglobin A1c is also worsened.  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  I would like him to be medically optimized and establish care with a primary physician to treat his diabetes beforehand.  He is at very high risk of infection if we are to go in now.  Is unclear to me as to why his sternum did not heal completely.  He does have a BMI of 38 however I did use double wires or closure.  He is agreeable with this plan and I will touch base with him again in a few weeks to ensure that his diabetes is being  managed appropriately.  Marissa Lowrey O Kaliel Bolds  

## 2020-12-17 NOTE — H&P (View-Only) (Signed)
     [address]       [name] [address]             [phone]       Matthew Tapia comes in to discuss sternal pain that started about 3 weeks ago.  He was last seen in March 2022 where he was cleared for cardiac rehab after undergoing coronary artery revascularization.  He has had multiple issues since being discharged from clinic which include narcotics abuse, along with medical noncompliance.  He no longer has a primary care physician and does not receive that his medications or been treating his diabetes.  In May of this year he was admitted to the hospital after sustaining a cerebrovascular event.  He has some residual visual field defects, but no motor defects.  He has been complaining of some clicking sensation and pain along his sternum for the past 3 weeks.  He is referred back to [place] for further evaluation.  Vitals:   12/17/20 1445  BP: 138/80  Pulse: 87  Resp: 20  SpO2: 97%   Alert NAD Regular rate and rhythm Easy work of breathing Incision is well-healed There is palpable clicking with cough as well as deep breaths along his sternum.  CT scan was reviewed.  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  Final report is pending.  On review of his labs from May his hemoglobin A1c is also worsened.  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  I would like him to be medically optimized and establish care with a primary physician to treat his diabetes beforehand.  He is at very high risk of infection if we are to go in now.  Is unclear to me as to why his sternum did not heal completely.  He does have a BMI of 38 however I did use double wires or closure.  He is agreeable with this plan and I will touch base with him again in a few weeks to ensure that his diabetes is being  managed appropriately.  Matthew Tapia

## 2020-12-18 ENCOUNTER — Telehealth: Payer: Self-pay | Admitting: Cardiovascular Disease

## 2020-12-18 ENCOUNTER — Telehealth: Payer: Self-pay | Admitting: Licensed Clinical Social Worker

## 2020-12-18 ENCOUNTER — Telehealth: Payer: Self-pay | Admitting: Family

## 2020-12-18 NOTE — Telephone Encounter (Signed)
Patient calling in to update Dr.Arida on the recent chest pain he has been going through. Patient will be having an additional surgery by Dr. Cliffton Asters.  Patient is also stating he may need help completing his disability forms and was asking about a possible Child psychotherapist.  Patient also states he is very appreciative for referring him back to his surgeon as that has been the key to his pain.  Please advise

## 2020-12-18 NOTE — Progress Notes (Signed)
Heart and Vascular Care Navigation  12/18/2020  Matthew Tapia 1965-06-09 [id number]  Reason for Referral:    Engaged with patient by telephone for initial visit for Heart and Vascular Care Coordination.                                                                                                   Assessment:              LCSW received referral for pt this afternoon regarding potential assistance w/ Medicaid and disability. Pt w/ ongoing cardiac f/u and future procedures. LCSW able to reach pt via telephone at [phone]. Introduced self, role, reason for call. Pt confirmed home address, he lives w/ his mother currently, is supported by his daughter and his mother; separated from wife. He has access to phone but not internet at home and does not use an email regularly. He was seeing a PCP through [facility], and utilized them for pain management- currently he is unable to see them without insurance since he would have to pay upfront and he financially can't afford to do that at the moment. He has not applied for Medicaid or Disability since losing his job in April. He also does not currently have a car due to inability to afford payments. He is getting SNAP at this time and is able to afford food with that. He is receiving his [facility] (with the exception of pain management medications). He is requesting assistance w/ Medicaid, disability, and other assistance programs since he anticipates having ongoing medical needs that will keep him out of work for the forseeable future.   LCSW shared that we could assist in the following ways. I am able to enroll him in Transportation Services to get to [facility] appointments/ongoing appointments w/ [facility] teams. I am also able to get him to DSS tomorrow to meet with caseworker to assess eligibility for Medicaid. LCSW can also send pt the pt a referral release for [facility] to assist him with starting the  disability process. If pt is not eligible for Medicaid at this time he may be eligible for [employer]. LCSW explained this to pt and shared that I would send application with instructions in case he does not meet eligibility during his visit to DSS.   Pt open to all the above referrals and understands that I remain available as needed moving forward to assist as able to with applications and any additional questions/concerns.                     HRT/VAS Care Coordination     Patients Home Cardiology Office [facility]   Outpatient Care Team Social Worker   Social Worker Name: Costella Hatcher [phone]   Living arrangements for the past 2 months Single Family Home   Lives with: Parents   Patient Current Insurance Coverage Self-Pay   Patient Has Concern With Paying Medical Bills Yes   Patient Concerns With Medical Bills past medical concerns and ongoing medical care   Medical Bill Referrals: referred  pt to DSS to be screened for Medicaid;  if not Medicaid eligible then will provide CAFA   Does Patient Have Prescription Coverage? Yes   Patient Prescription Assistance Programs Other   Other Assistance Programs Medications [facility] is currently filling all medications with the exception of narcotics   Home Assistive Devices/Equipment Eyeglasses; Dentures (specify type)   DME Agency NA       Social History:                                                                             SDOH Screenings   Alcohol Screen: Not on file  Depression (MWN0-2): Not on file  Financial Resource Strain: High Risk   Difficulty of Paying Living Expenses: Hard  Food Insecurity: No Food Insecurity   Worried About Running Out of Food in the Last Year: Never true   Ran Out of Food in the Last Year: Never true  Housing: Low Risk    Last Housing Risk Score: 0  Physical Activity: Not on file  Social Connections: Not on file  Stress: Stress  Concern Present   Feeling of Stress : To some extent  Tobacco Use: High Risk   Smoking Tobacco Use: Every Day   Smokeless Tobacco Use: Never  Transportation Needs: Unmet Transportation Needs   Lack of Transportation (Medical): Yes   Lack of Transportation (Non-Medical): Yes    SDOH Interventions: Financial Resources:  Financial Strain Interventions: Other (Comment) (referred for Medicaid screening at DSS; if not eligible for Medicaid will assist w/ CAFA; referred to [facility] for disability screening; gets meds through [facility] and will send [facility] information) DSS for financial assistance  Food Insecurity:  Food Insecurity Interventions: Intervention Not Indicated (currently recieving SNAP which he says helps with costs)  Housing Insecurity:  Housing Interventions: Intervention Not Indicated  Transportation:   Transportation Interventions: Retail banker, Other (Comment) (mailed additional Higher education careers adviser resources and enrolled him in [employer])     Other Care Navigation Interventions:     Provided Pharmacy assistance resources Other- [facility]   Patient expressed Mental Health concerns Yes, Referred to:  pt states it has been tough mentally but he is learning to cope with some of the stress that has arisen, provided support and encouraged him to let me know if he wanted more formal/regular mental health support   Follow-up plan:   I mailed pt the following: my card, Higher education careers adviser card, Transportation information list, [employer] Disability Referral Release of Information form, CAFA application and supporting forms as well as referral packet for [facility] should he want to utilize them for PCP while uninsured. Pt encouraged to f/u with me, I will reach back out to pt in the next 1-2 weeks to ensure resources received.

## 2020-12-18 NOTE — Telephone Encounter (Signed)
Spoke with the patient. Adv the patient that I was glad he was able to get back in with Dr. Cliffton Asters and there is a poc. Adv the patient that I will reach out to our social work team to see what assistance they can provide. Patient verbalized understanding and voiced appreciation for the assistance.

## 2020-12-23 ENCOUNTER — Telehealth: Payer: Self-pay

## 2020-12-23 NOTE — Telephone Encounter (Signed)
Patient contacted the office stating that he needed something stronger for pain. He is s/p CABG with Dr. Cliffton Asters 05/21/20. He has sternal pain that Dr. Cliffton Asters is aware of and is in need of second surgery for sternal reconstruction but needed diabetic management first. Patient advised to continue to take Tylenol and Ibuprofen as previously prescribed from Dr. Cliffton Asters. He stated that this is "tearing his stomach up" and he can no longer take it. Advised that if his pain was bad that he should go to the nearest emergency room for work-up. He states that he does not have a ride and advised to call 911 for evaluation and transportation. He stated that he has too many bills that he cannot pay. Patient hung up phone before conversation could be completed. Will await return call if warranted.

## 2020-12-24 ENCOUNTER — Telehealth: Payer: Self-pay | Admitting: Licensed Clinical Social Worker

## 2020-12-24 NOTE — Telephone Encounter (Signed)
LCSW called to f/u on assistance paperwork sent last week.  LCSW had scheduled a ride for pt to get to local DSS to assess Medicaid eligibility. If not eligible pt may be eligible for [employer]. Pt has been enrolled in [employer].   Called pt at [phone]. No answer, left voicemail. Remain available to assist pt with ongoing needs.   [name], MSW, LCSW [facility]Vascular Care Navigation  [phone]

## 2020-12-25 ENCOUNTER — Telehealth: Payer: Self-pay | Admitting: Licensed Clinical Social Worker

## 2020-12-25 NOTE — Telephone Encounter (Signed)
Received call back from pt, he shares that he received packet of assistance applications last night. He was able to submit a Medicaid application w/ caseworker last Thursday also at [facility] DSS. He shares that he has had consistent pain and is "swollen all over my body," he does sound more SOB during our call. I recommended, as TCTS also had on 8/1, that pt should call 911 to get assessed at the nearest emergency department. Pt at first avoidant due to cost and not wanting to go to [facility]. I inquired if pt daughter could take him to the nearest emergency department asap, he states probably not soon, and only in the evenings.   After speaking a bit longer LCSW again encouraged pt to call 911 since he has now been advised by multiple people to do so- it will be the quickest way to get to hospital and it allows ED to triage him appropriately. Pt states he will do so, will call me back when he has worked on paperwork sent and schedule a time to meet with me.   I reviewed my recommendation with triage LPN who agrees with recommendation to go to ED if pt having swelling and pain. I will f/u with pt in a week to check on status/answer any additional questions/concerns.   [name], MSW, LCSW [facility]/Vascular Care Navigation  [phone]

## 2020-12-27 ENCOUNTER — Encounter: Payer: Self-pay | Admitting: Medical

## 2020-12-27 ENCOUNTER — Other Ambulatory Visit: Payer: Self-pay

## 2020-12-27 ENCOUNTER — Ambulatory Visit (INDEPENDENT_AMBULATORY_CARE_PROVIDER_SITE_OTHER): Payer: Medicaid Other | Admitting: Medical

## 2020-12-27 ENCOUNTER — Other Ambulatory Visit
Admission: RE | Admit: 2020-12-27 | Discharge: 2020-12-27 | Disposition: A | Discharge status: Home / Self Care | Payer: Medicaid Other | Attending: Medical | Admitting: Medical

## 2020-12-27 VITALS — BP 140/90 | HR 79 | Ht 66.0 in | Wt 243.5 lb

## 2020-12-27 DIAGNOSIS — F1990 Other psychoactive substance use, unspecified, uncomplicated: Secondary | ICD-10-CM | POA: Diagnosis present

## 2020-12-27 DIAGNOSIS — I10 Essential (primary) hypertension: Secondary | ICD-10-CM

## 2020-12-27 DIAGNOSIS — E782 Mixed hyperlipidemia: Secondary | ICD-10-CM

## 2020-12-27 DIAGNOSIS — R079 Chest pain, unspecified: Secondary | ICD-10-CM

## 2020-12-27 DIAGNOSIS — I2510 Atherosclerotic heart disease of native coronary artery without angina pectoris: Secondary | ICD-10-CM | POA: Diagnosis not present

## 2020-12-27 DIAGNOSIS — R600 Localized edema: Secondary | ICD-10-CM

## 2020-12-27 DIAGNOSIS — I255 Ischemic cardiomyopathy: Secondary | ICD-10-CM

## 2020-12-27 DIAGNOSIS — M9689 Other intraoperative and postprocedural complications and disorders of the musculoskeletal system: Secondary | ICD-10-CM

## 2020-12-27 DIAGNOSIS — I2119 ST elevation (STEMI) myocardial infarction involving other coronary artery of inferior wall: Secondary | ICD-10-CM

## 2020-12-27 DIAGNOSIS — I5020 Unspecified systolic (congestive) heart failure: Secondary | ICD-10-CM

## 2020-12-27 DIAGNOSIS — R7303 Prediabetes: Secondary | ICD-10-CM

## 2020-12-27 DIAGNOSIS — Z951 Presence of aortocoronary bypass graft: Secondary | ICD-10-CM

## 2020-12-27 DIAGNOSIS — Z79899 Other long term (current) drug therapy: Secondary | ICD-10-CM

## 2020-12-27 LAB — BASIC METABOLIC PANEL
Anion gap: 7 (ref 5–15)
BUN: 10 mg/dL (ref 6–20)
CO2: 25 mmol/L (ref 22–32)
Calcium: 9.5 mg/dL (ref 8.9–10.3)
Chloride: 107 mmol/L (ref 98–111)
Creatinine, Ser: 0.83 mg/dL (ref 0.61–1.24)
GFR, Estimated: >60 mL/min (ref >60)
Glucose, Bld: 92 mg/dL (ref 70–99)
Potassium: 4.3 mmol/L (ref 3.5–5.1)
Sodium: 139 mmol/L (ref 135–145)

## 2020-12-27 LAB — LIPID PANEL
Cholesterol: 168 mg/dL (ref 0–200)
HDL: 37 mg/dL — ABNORMAL LOW (ref >40)
LDL Cholesterol: 69 mg/dL (ref 0–99)
Total CHOL/HDL Ratio: 4.5 RATIO
Triglycerides: 309 mg/dL — ABNORMAL HIGH (ref <150)
VLDL: 62 mg/dL — ABNORMAL HIGH (ref 0–40)

## 2020-12-27 LAB — BRAIN NATRIURETIC PEPTIDE: B Natriuretic Peptide: 134.7 pg/mL — ABNORMAL HIGH (ref 0.0–100.0)

## 2020-12-27 LAB — HEMOGLOBIN A1C
Hgb A1c MFr Bld: 6 % — ABNORMAL HIGH (ref 4.8–5.6)
Mean Plasma Glucose: 125.5 mg/dL

## 2020-12-27 MED ORDER — LOSARTAN POTASSIUM 25 MG PO TABS
25.0000 mg | ORAL_TABLET | Freq: Every day | ORAL | 1 refills | Status: DC
  Filled 2020-12-27: qty 90, 90d supply, fill #0
  Filled 2021-01-03: qty 30, 30d supply, fill #0
  Filled 2021-02-19: qty 30, 30d supply, fill #1

## 2020-12-27 MED ORDER — CARVEDILOL 6.25 MG PO TABS
6.2500 mg | ORAL_TABLET | Freq: Two times a day (BID) | ORAL | 1 refills | Status: DC
  Filled 2020-12-27: qty 180, 90d supply, fill #0
  Filled 2021-01-03: qty 60, 30d supply, fill #0
  Filled 2021-02-19: qty 60, 30d supply, fill #1

## 2020-12-27 MED ORDER — ATORVASTATIN CALCIUM 80 MG PO TABS
80.0000 mg | ORAL_TABLET | Freq: Every day | ORAL | 1 refills | Status: DC
  Filled 2020-12-27: qty 90, 90d supply, fill #0
  Filled 2021-01-03: qty 30, 30d supply, fill #0
  Filled 2021-02-19: qty 30, 30d supply, fill #1

## 2020-12-27 MED ORDER — FUROSEMIDE 20 MG PO TABS
20.0000 mg | ORAL_TABLET | Freq: Every day | ORAL | 1 refills | Status: DC
  Filled 2020-12-27: qty 180, 180d supply, fill #0
  Filled 2020-12-27: qty 14, 14d supply, fill #0
  Filled 2021-01-03: qty 14, 14d supply, fill #1
  Filled 2021-01-20: qty 30, 30d supply, fill #2
  Filled 2021-02-19: qty 30, 30d supply, fill #3

## 2020-12-27 NOTE — Progress Notes (Signed)
Cardiology Office Note:    Date:  12/28/2020   ID:  Matthew Tapia, DOB 10/26/1965, MRN [id number]  PCP:  Miki Kins, FNP  [facility] Cardiologist:  Lorine Bears, MD  [facility] Electrophysiologist:  None   Referring MD: Miki Kins, FNP   Chief Complaint: F/u ED visit for edema  History of Present Illness:    Matthew Tapia is a 55 y.o. male with a hx of CAD status post CABG and stenting, substance abuse including cocaine and amphetamine, anxiety, chronic pain, hypertension, hyperlipidemia, tobacco use, stroke May 2022 who presents for ED follow-up.   In December 2021 patient was admitted with STEMI.  Urgent cath showed multivessel disease and thrombus in the RCA. This was cleared with aspiration thrombectomy.  He was started on Aggrastat and Brilinta.  Echo showed EF 35 to 40%, grade 1 diastolic dysfunction, severe hypokinesis of the left ventricular wall.  He was taken back to the Cath Lab 05/16/2020 which showed restoration of flow to the RCA no further intervention was required.  Patient was eventually transferred to [facility] for CABG and this was scheduled for 1/28. However on 05/20/2020 the patient developed worsening chest pain with ST changes on EKG.  Cath showed occluded occlusion of RCA and he this was treated with PTCA and balloon angioplasty with restoration of flow.  He was taken to the operating room 05/21/2020 and underwent CABG x4 with LIMA to the LAD, SVG to OM1, SVG to OM2, SVG to PDA.  He was hospitalized in May with mental status changes.  MRI showed patchy multifocal acute ischemic infarcts.  He was dehydrated improved with IV fluids.  Urine tox screen was negative. Echo bubble study during that time showed LVEF 40 to 45%.  Went to the ER 11/23/2020 for chest pain.Troponin was negative. Work-up for ACS was negative.  Patient was seen 11/28/2020 by  Dr. Kirke Corin for ER follow-up.  It was felt his chest pain was musculoskeletal and it was recommended  he  see CT surgeon. Medical management was continued.  Saw Dr. 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 Plan for sternal plating. He wants diabetes to be more under control before surgery, however patient does not have PCP given lack of insurance. Hgb A1C 6.26 Sep 2020  Seen 8/3/2 in the ED for edema and shortness of breath. CXR showed clear lungs. BNP 236. Spoke with cardiothoracic sugeon who did not recommend intervention. They gave him lasix  daily. Called in prescription at [facility] and he was unable to pick it up with lack of incurance. Medicare should kick in September   Today, the patient reports breathing got better with lasix. Feels swelling is coming back. Trace edema on exam. Lungs with wheezing. Chest pain worse in the morning. Sharp stabbing. Pain, arm numbness. Chest pain worse with exertion. Same pain he had at is last visit. Patient needs the CT surgery, but needs to get diabetes under control. Difficult situation given lack of insurance. He has apt next month with PCP since that is when Medicaid kicks in.    Past Medical History:  Diagnosis Date   Anxiety    CAD, multiple vessel    Dyslipidemia, goal LDL below 70    Former smoker    Quit 2021   HFrEF (heart failure with reduced ejection fraction) (HCC)    Polysubstance abuse (HCC)    UDS  positive for cocaine, amphetamines, benzos 05/15/20   ST elevation myocardial infarction (STEMI) of inferior wall [facility](HCC)     Past Surgical History:  Procedure Laterality Date   CORONARY ANGIOGRAPHY N/A 05/20/2020   Procedure: CORONARY ANGIOGRAPHY;  Surgeon: Kathleene HazelMcAlhany, Christopher D, MD;  Location: [facility] INVASIVE CV LAB;  Service: Cardiovascular;  Laterality: N/A;   CORONARY ARTERY BYPASS GRAFT N/A 05/21/2020   Procedure: CORONARY ARTERY BYPASS GRAFTING TIMES FOUR USING LEFT INTERNAL MAMMARY ARTERY  AND ENDOSCOPICALLY HARVESTED RIGHT GREATER  SAPHANOUS VEIN.;  Surgeon: Corliss SkainsLightfoot, Harrell O, MD;  Location: [facility] OR;  Service: Open Heart Surgery;  Laterality: N/A;  flow track   CORONARY THROMBECTOMY N/A 05/20/2020   Procedure: Coronary Thrombectomy;  Surgeon: Kathleene HazelMcAlhany, Christopher D, MD;  Location: [facility] INVASIVE CV LAB;  Service: Cardiovascular;  Laterality: N/A;   CORONARY/GRAFT ACUTE MI REVASCULARIZATION N/A 05/14/2020   Procedure: Coronary/Graft Acute MI Revascularization;  Surgeon: Iran OuchArida, Muhammad A, MD;  Location: [facility] INVASIVE CV LAB;  Service: Cardiovascular;  Laterality: N/A;   CORONARY/GRAFT ACUTE MI REVASCULARIZATION N/A 05/20/2020   Procedure: Coronary/Graft Acute MI Revascularization;  Surgeon: Kathleene HazelMcAlhany, Christopher D, MD;  Location: [facility] INV[facility] CV LAB;  Service: Cardiovascular;  Laterality: N/A;   ENDOVEIN HARVEST OF GREATER SAPHENOUS VEIN Right 05/21/2020   Procedure: ENDOVEIN HARVEST OF RIGHT GREATER SAPHENOUS VEIN;  Surgeon: Corliss SkainsLightfoot, Harrell O, MD;  Location: [facility] OR;  Service: Open Heart Surgery;  Laterality: Right;   INTRAVASCULAR PRESSURE WIRE/FFR STUDY N/A 05/16/2020   Procedure: INTRAVASCULAR PRESSURE WIRE/FFR STUDY;  Surgeon: Iran OuchArida, Muhammad A, MD;  Location: [facility] INVASIVE CV LAB;  Service: Cardiovascular;  Laterality: N/A;   LEFT HEART CATH AND CORONARY ANGIOGRAPHY N/A 05/14/2020   Procedure: LEFT HEART CATH AND CORONARY ANGIOGRAPHY;  Surgeon: Iran OuchArida, Muhammad A, MD;  Location: [facility] INVASIVE CV LAB;  Service: Cardiovascular;  Laterality: N/A;   LEFT HEART CATH AND CORONARY ANGIOGRAPHY N/A 05/16/2020   Procedure: LEFT HEART CATH AND CORONARY ANGIOGRAPHY;  Surgeon: Iran OuchArida, Muhammad A, MD;  Location: [facility] INVASIVE CV LAB;  Service: Cardiovascular;  Laterality: N/A;   SHOULDER SURGERY     TEE WITHOUT CARDIOVERSION N/A 05/21/2020   Procedure: TRANSESOPHAGEAL ECHOCARDIOGRAM (TEE);  Surgeon: Corliss SkainsLightfoot, Harrell O, MD;  [facility];  Service: Open Heart Surgery;  Laterality: N/A;    Current Medications: Current Meds   Medication Sig   aspirin EC 81 MG tablet Take 1 tablet (81 mg total) by once mouth daily. Swallow whole.   oxyCODONE-acetaminophen (PERCOCET/ROXICET) 5-325 MG tablet Take 1 tablet by mouth every 6 (six) hours as needed for severe pain.   [DISCONTINUED] atorvastatin (LIPITOR) 80 MG tablet Take 1 tablet (80 mg total) by mouth once daily.   [DISCONTINUED] carvedilol (COREG) 6.25 MG tablet Take 1 tablet (6.25 mg total) by mouth 2 (two) times daily.   [DISCONTINUED] furosemide (LASIX) 20 MG tablet Take 2 tablets by mouth daily.   [DISCONTINUED] losartan (COZAAR) 25 MG tablet Take 1 tablet (25 mg total) by mouth once daily.     Allergies:   Patient has no known allergies.   Social History   Socioeconomic History   Marital status: Married    Spouse name: Not on file   Number of children: Not on file   Years of education: Not on file   Highest education level: Not on file  Occupational History   Not on file  Tobacco Use   Smoking status: Every Day    Packs/day: 0.25    Years: 42.00    Pack years: 10.50  Types: Cigarettes   Smokeless tobacco: Never   Tobacco comments:    1-2 cigarettes "every now and then"  Substance and Sexual Activity   Alcohol use: No   Drug use: Never   Sexual activity: Not on file  Other Topics Concern   Not on file  Social History Narrative   Not on file   Social Determinants of Health   Financial Resource Strain: High Risk   Difficulty of Paying Living Expenses: Hard  Food Insecurity: No Food Insecurity   Worried About Running Out of Food in the Last Year: Never true   Ran Out of Food in the Last Year: Never true  Transportation Needs: Unmet Transportation Needs   Lack of Transportation (Medical): Yes   Lack of Transportation (Non-Medical): Yes  Physical Activity: Not on file  Stress: Stress Concern Present   Feeling of Stress : To some extent  Social Connections: Not on file     Family History: The patient's family history includes Heart  attack in his father.  ROS:   Please see the history of present illness.     All other systems reviewed and are negative.  EKGs/Labs/Other Studies Reviewed:    The following studies were reviewed today:  Echo complete bubble study 10/04/2020  1. Left ventricular ejection fraction, by estimation, is 40 to 45%. The  left ventricle has normal function. The left ventricle demonstrates  regional wall motion abnormalities (see scoring diagram/findings for  description). Left ventricular diastolic  parameters were normal.   2. Right ventricular systolic function is normal. The right ventricular  size is normal.   3. The mitral valve is normal in structure. Mild to moderate mitral valve  regurgitation.   4. The aortic valve is normal in structure. Aortic valve regurgitation is  trivial.   Left heart cath 05/16/2021  Prox LAD to Mid LAD lesion is 70% stenosed. Mid LAD to Dist LAD lesion is 40% stenosed. Mid Cx to Dist Cx lesion is 95% stenosed. 2nd Mrg lesion is 80% stenosed. Prox RCA to Dist RCA lesion is 80% stenosed. 1st Diag lesion is 99% stenosed.   1.  Severe three-vessel coronary artery disease.  Thrombus burden in the right coronary artery improved significantly in the vessel has normal TIMI-3 flow but has diffuse disease from the proximal to the distal segment before the bifurcation.  In addition, the patient seems to have significant proximal to mid LAD disease which was highly significant by fractional flow reserve evaluation with an IFR ratio of 0.76.  There is complex mid left circumflex disease at the bifurcation of OM 2.  There is also subtotal occlusion of first diagonal which seems to be diffusely diseased.   2.  Left ventricular angiography was not performed.  EF was 35 to 40% by echo. 3.  Significant improvement in left ventricular end-diastolic pressure which is currently only mildly elevated at 17 mmHg.   Recommendations: Given diffuse disease in the right coronary  artery and the presence of three-vessel coronary artery disease, recommend evaluation for CABG. I am going to discontinue treatment with Brilinta. Given residual thrombus in the right coronary artery, recommend continuing Aggrastat infusion until CABG. Suspect that CABG can be done early next week. I will make arrangements for transfer to [facility]   Coronary intervention 05/20/2020 Mid LAD to Dist LAD lesion is 40% stenosed. 1st Diag lesion is 99% stenosed. Prox LAD to Mid LAD lesion is 70% stenosed. Mid Cx to Dist Cx lesion is 95% stenosed. 2nd  Mrg lesion is 80% stenosed. Prox RCA to Dist RCA lesion is 80% stenosed. Prox RCA lesion is 100% stenosed. Balloon angioplasty was performed using a BALLOON EMERGE MR 2.5X15. Post intervention, there is a 80% residual stenosis.   1. Acute occlusion of the proximal RCA 2. Successful PTCA/ balloon angioplasty of the proximal RCA 3. No change in severe disease in the LAD and circumflex   Recommendations: Will continue ASA, statin and beta blocker. While awaiting bypass, will transition from Aggrastat to Cangrelor drip due to hospital shortage of Aggrastat. CT surgery notified of change in clinical situation. I think he is stable to wait for bypass tomorrow. Will keep NPO for now.  Echo 05/15/2020 1. Left ventricular ejection fraction, by estimation, is 35 to 40%. The  left ventricle has moderately decreased function. The left ventricle  demonstrates regional wall motion abnormalities (see scoring  diagram/findings for description). Left ventricular   diastolic parameters are consistent with Grade I diastolic dysfunction  (impaired relaxation). There is severe hypokinesis of the left  ventricular, entire inferolateral wall and inferior wall. The average left  ventricular global longitudinal strain is  -11.6 %. The global longitudinal strain is abnormal.   2. Right ventricular systolic function is normal. The right ventricular  size  is normal.   3. The mitral valve is normal in structure. No evidence of mitral valve  regurgitation.   4. The aortic valve was not well visualized. Aortic valve regurgitation  is not visualized.   5. The inferior vena cava is normal in size with greater than 50%  respiratory variability, suggesting right atrial pressure of 3 mmHg.   EKG:  EKG is  ordered today.  The ekg ordered today demonstrates NSR, 79bpm, inferior TWI  Recent Labs: 10/03/2020: TSH 0.791 10/04/2020: ALT 18; Magnesium 2.2 11/22/2020: Hemoglobin 13.8; Platelets 234 12/27/2020: B Natriuretic Peptide 134.7; BUN 10; Creatinine, Ser 0.83; Potassium 4.3; Sodium 139  Recent Lipid Panel    Component Value Date/Time   CHOL 168 12/27/2020 1531   TRIG 309 (H) 12/27/2020 1531   HDL 37 (L) 12/27/2020 1531   CHOLHDL 4.5 12/27/2020 1531   VLDL 62 (H) 12/27/2020 1531   LDLCALC 69 12/27/2020 1531   LDLDIRECT 139.3 (H) 10/04/2020 1131      Physical Exam:    VS:  BP 140/90 (BP Location: Left Arm, Patient Position: Sitting, Cuff Size: Large)   Pulse 79   Ht 5\' 6"  (1.676 m)   Wt 243 lb 8 oz (110.5 kg)   SpO2 98%   BMI 39.30 kg/m     Wt Readings from Last 3 Encounters:  12/27/20 243 lb 8 oz (110.5 kg)  12/17/20 240 lb (108.9 kg)  11/28/20 240 lb 2 oz (108.9 kg)     GEN:  Well nourished, well developed in no acute distress HEENT: Normal NECK: No JVD; No carotid bruits LYMPHATICS: No lymphadenopathy CARDIAC: RRR, no murmurs, rubs, gallops RESPIRATORY:  wheezing ABDOMEN: Soft, non-tender, non-distended MUSCULOSKELETAL:  Trace lower leg edema; No deformity  SKIN: Warm and dry NEUROLOGIC:  Alert and oriented x 3 PSYCHIATRIC:  Normal affect   ASSESSMENT:    1. Chest pain of uncertain etiology   2. CAD, multiple vessel   3. Cardiomyopathy, ischemic   4. Essential hypertension   5. HFrEF (heart failure with reduced ejection fraction) (HCC)   6. Medication management   7. 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  Hyperlipidemia, mixed   9. Ischemic cardiomyopathy   10. S/P CABG x 4  11. Prediabetes   12. Lower leg edema   13. Substance use    PLAN:    In order of problems listed above:  CAD status post CABG 07/2020 Chest pain Nonunion of Sternum Patient reports chest pain similar to his prior visit. He saw CT surgery and a CT chest was ordered. CT showed nonunion of the sternum and are planning for sternal plating, however wanting optimization of volume status and diabetes. Suspect chest pain from sternum. EKG without acute changes.  Patient requesting pain meds as well for chest pain, I will given 15 percocet. Will start lasix for volume status and give him options for PCP that can see him sooner. Ultimately patient is needing CT surgery. Continue Aspirin, statin, BB.   HFrEF Ischemic cardiomyopathy Recent Ed visit for shortness of breath and edema found to have BNP 236 and was given lasix, but he was unable to fill it. Patient feels breathing is better. He has trace lower leg edema on exam. I will start lasix 20mg  daily. I will check BMET and BNP today. Will wait on medication changes today, can transition Losartan to Entresto at a later date. Continue Coreg. Will re-check labs at follow-up. CHF education given.   Hyperlipidemia LDL 69 12/2020. Continue Atorvastatin.   Possible diabetes A1C 6.5 in 09/2020. I will re-check A1C. Diabetes diet encouraged. Consider starting metformin pending A1C. Will refer to PCP.   Tobacco use Patient is still smoking. cessation advised.   Disposition: Follow up in 2 week(s) with APP     Signed, Shakeila Pfarr 10/2020, PA-C  12/28/2020 2:38 PM    [facility]

## 2020-12-27 NOTE — Patient Instructions (Addendum)
Medication Instructions:   Your physician has recommended you make the following change in your medication:   CHANGE Furosemide (Lasix) to 20mg  DAILY  *If you need a refill on your cardiac medications before your next appointment, please call your pharmacy*   Lab Work:  Your physician recommends that you have labs TODAY: BMET, BNP, Hgb A1c  -  [facility]. You will check in at the front desk to the right as you walk into the atrium.   If you have labs (blood work) drawn today and your tests are completely normal, you will receive your results only by: MyChart Message (if you have MyChart) OR A paper copy in the mail If you have any lab test that is abnormal or we need to change your treatment, we will call you to review the results.   Testing/Procedures:  None ordered   Follow-Up: At [facility], you and your health needs are our priority.  As part of our continuing mission to provide you with exceptional heart care, we have created designated Provider Care Teams.  These Care Teams include your primary Cardiologist (physician) and Advanced Practice Providers (APPs -  Physician Assistants and Nurse Practitioners) who all work together to provide you with the care you need, when you need it.  We recommend signing up for the patient portal called "MyChart".  Sign up information is provided on this After Visit Summary.  MyChart is used to connect with patients for Virtual Visits (Telemedicine).  Patients are able to view lab/test results, encounter notes, upcoming appointments, etc.  Non-urgent messages can be sent to your provider as well.   To learn more about what you can do with MyChart, go to [email or web address].    Your next appointment:   1 - 2 week(s) (01/08/21 in AM with Cadence Fransico MichaelFurth, PA)  The format for your next appointment:   In Person  Provider:    Cadence Lorna FewFurth, PA-C  Other Instructions  Please call [facility] @  [phone] for assistance in setting up primary care provider. Handout given in office today.    Diabetes Mellitus and Nutrition, Adult When you have diabetes, or diabetes mellitus, it is very important to have healthy eating habits because your blood sugar (glucose) levels are greatly affected by what you eat and drink. Eating healthy foods in the right amounts, at about the same times every day, can help you: Control your blood glucose. Lower your risk of heart disease. Improve your blood pressure. Reach or maintain a healthy weight. What can affect my meal plan? Every person with diabetes is different, and each person has different needs for a meal plan. Your health care provider may recommend that you work with a dietitian to make a meal plan that is best for you. Your meal plan may vary depending on factors such as: The calories you need. The medicines you take. Your weight. Your blood glucose, blood pressure, and cholesterol levels. Your activity level. Other health conditions you have, such as heart or kidney disease. How do carbohydrates affect me? Carbohydrates, also called carbs, affect your blood glucose level more than any other type of food. Eating carbs naturally raises the amount of glucose in your blood. Carb counting is a method for keeping track of how many carbs you eat. Counting carbs is important to keep your blood glucose at a healthy level,especially if you use insulin or take certain oral diabetes medicines. It is important to know how many carbs you  can safely have in each meal. This is different for every person. Your dietitian can help you calculate how manycarbs you should have at each meal and for each snack. How does alcohol affect me? Alcohol can cause a sudden decrease in blood glucose (hypoglycemia), especially if you use insulin or take certain oral diabetes medicines. Hypoglycemia can be a life-threatening condition. Symptoms of hypoglycemia, such as  sleepiness, dizziness, and confusion, are similar to symptoms of having too much alcohol. Do not drink alcohol if: Your health care provider tells you not to drink. You are pregnant, may be pregnant, or are planning to become pregnant. If you drink alcohol: Do not drink on an empty stomach. Limit how much you use to: 0-1 drink a day for women. 0-2 drinks a day for men. Be aware of how much alcohol is in your drink. In the [place] one drink equals one 12 oz bottle of beer (355 mL), one 5 oz glass of wine (148 mL), or one 1 oz glass of hard liquor (44 mL). Keep yourself hydrated with water, diet soda, or unsweetened iced tea. Keep in mind that regular soda, juice, and other mixers may contain a lot of sugar and must be counted as carbs. What are tips for following this plan?  Reading food labels Start by checking the serving size on the "Nutrition Facts" label of packaged foods and drinks. The amount of calories, carbs, fats, and other nutrients listed on the label is based on one serving of the item. Many items contain more than one serving per package. Check the total grams (g) of carbs in one serving. You can calculate the number of servings of carbs in one serving by dividing the total carbs by 15. For example, if a food has 30 g of total carbs per serving, it would be equal to 2 servings of carbs. Check the number of grams (g) of saturated fats and trans fats in one serving. Choose foods that have a low amount or none of these fats. Check the number of milligrams (mg) of salt (sodium) in one serving. Most people should limit total sodium intake to less than 2,300 mg per day. Always check the nutrition information of foods labeled as "low-fat" or "nonfat." These foods may be higher in added sugar or refined carbs and should be avoided. Talk to your dietitian to identify your daily goals for nutrients listed on the label. Shopping Avoid buying canned, pre-made, or processed foods. These foods  tend to be high in fat, sodium, and added sugar. Shop around the outside edge of the grocery store. This is where you will most often find fresh fruits and vegetables, bulk grains, fresh meats, and fresh dairy. Cooking Use low-heat cooking methods, such as baking, instead of high-heat cooking methods like deep frying. Cook using healthy oils, such as olive, canola, or sunflower oil. Avoid cooking with butter, cream, or high-fat meats. Meal planning Eat meals and snacks regularly, preferably at the same times every day. Avoid going long periods of time without eating. Eat foods that are high in fiber, such as fresh fruits, vegetables, beans, and whole grains. Talk with your dietitian about how many servings of carbs you can eat at each meal. Eat 4-6 oz (112-168 g) of lean protein each day, such as lean meat, chicken, fish, eggs, or tofu. One ounce (oz) of lean protein is equal to: 1 oz (28 g) of meat, chicken, or fish. 1 egg.  cup (62 g) of tofu. Eat some foods  each day that contain healthy fats, such as avocado, nuts, seeds, and fish. What foods should I eat? Fruits Berries. Apples. Oranges. Peaches. Apricots. Plums. Grapes. Mango. Papaya.Pomegranate. Kiwi. Cherries. Vegetables Lettuce. Spinach. Leafy greens, including kale, chard, collard greens, and mustard greens. Beets. Cauliflower. Cabbage. Broccoli. Carrots. Green beans.Tomatoes. Peppers. Onions. Cucumbers. Brussels sprouts. Grains Whole grains, such as whole-wheat or whole-grain bread, crackers, tortillas,cereal, and pasta. Unsweetened oatmeal. Quinoa. Brown or wild rice. Meats and other proteins Seafood. Poultry without skin. Lean cuts of poultry and beef. Tofu. Nuts. Seeds. Dairy Low-fat or fat-free dairy products such as milk, yogurt, and cheese. The items listed above may not be a complete list of foods and beverages you can eat. Contact a dietitian for more information. What foods should I avoid? Fruits Fruits canned with  syrup. Vegetables Canned vegetables. Frozen vegetables with butter or cream sauce. Grains Refined white flour and flour products such as bread, pasta, snack foods, andcereals. Avoid all processed foods. Meats and other proteins Fatty cuts of meat. Poultry with skin. Breaded or fried meats. Processed meat.Avoid saturated fats. Dairy Full-fat yogurt, cheese, or milk. Beverages Sweetened drinks, such as soda or iced tea. The items listed above may not be a complete list of foods and beverages you should avoid. Contact a dietitian for more information. Questions to ask a health care provider Do I need to meet with a diabetes educator? Do I need to meet with a dietitian? What number can I call if I have questions? When are the best times to check my blood glucose? Where to find more information: American Diabetes Association: diabetes.org Academy of Nutrition and Dietetics: www.eatright.[employer] of Diabetes and Digestive and Kidney Diseases: CarFlippers.tn Association of Diabetes Care and Education Specialists: www.diabeteseducator.org Summary It is important to have healthy eating habits because your blood sugar (glucose) levels are greatly affected by what you eat and drink. A healthy meal plan will help you control your blood glucose and maintain a healthy lifestyle. Your health care provider may recommend that you work with a dietitian to make a meal plan that is best for you. Keep in mind that carbohydrates (carbs) and alcohol have immediate effects on your blood glucose levels. It is important to count carbs and to use alcohol carefully. This information is not intended to replace advice given to you by your health care provider. Make sure you discuss any questions you have with your healthcare provider. Document Revised: 04/18/2019 Document Reviewed: 04/18/2019 Elsevier Patient Education  2021 Elsevier Inc.   Heart Failure, Diagnosis  Heart failure is a condition  in which the heart has trouble pumping blood. This may mean that the heart cannot pump enough blood out to the body or that the heart does not fill up with enough blood. For some people with heart failure, fluid may back up into the lungs. There may also be swelling (edema) in the lower legs. Heart failure is usually a long-term (chronic) condition. It is important for you to take good care of yourself and followthe treatment plan from your health care provider. What are the causes? This condition may be caused by: High blood pressure (hypertension). Hypertension causes the heart muscle to work harder than normal. Coronary artery disease, or CAD. CAD is the buildup of cholesterol and fat (plaque) in the arteries of the heart. Heart attack, also called myocardial infarction. This injures the heart muscle, making it hard for the heart to pump blood. Abnormal heart valves. The valves do not open and close properly,  forcing the heart to pump harder to keep the blood flowing. Heart muscle disease, inflammation, or infection (cardiomyopathy or myocarditis). This is damage to the heart muscle. It can increase the risk of heart failure. Lung disease. The heart works harder when the lungs are not healthy. What increases the risk? The risk of heart failure increases as a person ages. This condition is also more likely to develop in people who: Are obese. Are male. Use tobacco or nicotine products. Abuse alcohol or drugs. Have taken medicines that can damage the heart, such as chemotherapy drugs. Have any of these conditions: Diabetes. Abnormal heart rhythms. Thyroid problems. Low blood counts (anemia). Chronic kidney disease. Have a family history of heart failure. What are the signs or symptoms? Symptoms of this condition include: Shortness of breath with activity, such as when climbing stairs. A cough that does not go away. Swelling of the feet, ankles, legs, or abdomen. Losing or gaining weight  for no reason. Trouble breathing when lying flat. Waking from sleep because of the need to sit up and get more air. Rapid heartbeat. Tiredness (fatigue) and loss of energy. Feeling light-headed, dizzy, or close to fainting. Nausea or loss of appetite. Waking up more often during the night to urinate (nocturia). Confusion. How is this diagnosed? This condition is diagnosed based on: Your medical history, symptoms, and a physical exam. Diagnostic tests, which may include: Echocardiogram. Electrocardiogram (ECG). Chest X-ray. Blood tests. Exercise stress test. Cardiac MRI. Cardiac catheterization and angiogram. Radionuclide scans. How is this treated? Treatment for this condition is aimed at managing the symptoms of heart failure. Medicines Treatment may include medicines that: Help lower blood pressure by relaxing (dilating) the blood vessels. These medicines are called ACE inhibitors (angiotensin-converting enzyme), ARBs (angiotensin receptor blockers), or vasodilators. Cause the kidneys to remove salt and water from the blood through urination (diuretics). Improve heart muscle strength and prevent the heart from beating too fast (beta blockers). Increase the force of the heartbeat (digoxin). Lower heart rates. Certain diabetes medicines (SGLT-2 inhibitors) may also be used in treatment. Healthy behavior changes Treatment may also include making healthy lifestyle changes, such as: Reaching and staying at a healthy weight. Not using tobacco or nicotine products. Eating heart-healthy foods. Limiting or avoiding alcohol. Stopping the use of illegal drugs. Being physically active. Participating in a cardiac rehabilitation program, which is a treatment program to improve your health and well-being through exercise training, education, and counseling. Other treatments Other treatments may include: Procedures to open blocked arteries or repair damaged valves. Placing a pacemaker to  improve heart function (cardiac resynchronization therapy). Placing a device to treat serious abnormal heart rhythms (implantable cardioverter defibrillator, or ICD). Placing a device to improve the pumping ability of the heart (left ventricular assist device, or LVAD). Receiving a healthy heart from a donor (heart transplant). This is done when other treatments have not helped. Follow these instructions at home: Manage other health conditions as told by your health care provider. These may include hypertension, diabetes, thyroid disease, or abnormal heart rhythms. Get ongoing education and support as needed. Learn as much as you can about heart failure. Keep all follow-up visits. This is important. Summary Heart failure is a condition in which the heart has trouble pumping blood. This condition is commonly caused by high blood pressure and other diseases of the heart and lungs. Symptoms of this condition include shortness of breath, tiredness (fatigue), nausea, and swelling of the feet, ankles, legs, or abdomen. Treatments for this condition may include  medicines, lifestyle changes, and surgery. Manage other health conditions as told by your health care provider. This information is not intended to replace advice given to you by your health care provider. Make sure you discuss any questions you have with your healthcare provider. Document Revised: 12/02/2019 Document Reviewed: 12/02/2019 Elsevier Patient Education  2022 Elsevier Inc.   Congestive Heart Failure Guidelines:  Do the following things EVERYDAY: Weigh yourself in the morning before breakfast. Write it down and keep it in a log. Take your medicines as prescribed Eat low salt foods--Limit salt (sodium) to 2000 mg per day.  Stay as active as you can everyday Limit all fluids for the day to less than 2 liters  Stay physically active! Staying active will give you more energy and make your muscles stronger. Start with 5 minutes at  a time and work your way up to 30 minutes a day. Break up your activities--do some in the morning and some in the afternoon. Start with 3 days per week and work your way up to 5 days as you can.  If you have chest pain, feel short of breath, dizzy, or lightheaded, STOP. If you don't feel better after a short rest, call 911. If you do feel better, call the heart failure clinic to let them know you have symptoms with exercise.

## 2020-12-30 ENCOUNTER — Telehealth: Payer: Self-pay | Admitting: Licensed Clinical Social Worker

## 2020-12-30 ENCOUNTER — Other Ambulatory Visit: Payer: Self-pay | Admitting: *Deleted

## 2020-12-30 ENCOUNTER — Telehealth: Payer: Self-pay | Admitting: *Deleted

## 2020-12-30 ENCOUNTER — Encounter: Payer: Self-pay | Admitting: *Deleted

## 2020-12-30 DIAGNOSIS — R0789 Other chest pain: Secondary | ICD-10-CM

## 2020-12-30 NOTE — Telephone Encounter (Signed)
-----   Message from Cadence David Stall, PA-C sent at 12/30/2020  1:35 PM EDT ----- A1C improved from prior at 6 BNP improved as well (heart failure lab value) BMET unremarkable Lipid panel with high TG and low HDL. Recommend diet changes as discussed Still recommend visit with PCP for possible diabetes managment

## 2020-12-30 NOTE — Telephone Encounter (Signed)
Patient returning call.

## 2020-12-30 NOTE — Addendum Note (Signed)
Addended by: Kendrick Fries on: 12/30/2020 03:45 PM   Modules accepted: Orders

## 2020-12-30 NOTE — Telephone Encounter (Signed)
LCSW reached out to financial counselor Christia Reading, confirmed pt Medicaid application is pending with [place] DSS, caseworker is Hermelinda Medicus. LCSW will send pt caseworkers contact and encourage regular f/u. Medicaid can take 60-90 days to process. Await return of disability referral form to complete referral to [facility] to start disability process. Pt will not be eligible for CAFA or [place] Card until determination from [place] received.   Matthew Tapia, MSW, LCSW [facility] Navigation  [phone]

## 2020-12-30 NOTE — Telephone Encounter (Signed)
Left voicemail message to call back for review of results.  

## 2020-12-30 NOTE — Telephone Encounter (Signed)
Reviewed results and recommendations with patient. He does have surgery coming up with Dr. Cliffton Asters. He does not have primary care provider and discussed the importance of seeing someone to help manage his diabetes and ongoing care. He reports he will call to get appointment for primary care provider. He then requested something for pain and advised that we do not prescribe those. He states provider ordered previously and just wants to know if he can get enough to last until next Thursday. Will send over to provider for review and recommendations.

## 2020-12-31 NOTE — Telephone Encounter (Signed)
Pt called LCSW this afternoon to let me know that he has been to hospital, gone to his f/u with Cadence on the 5th and is feeling better since getting some of the fluid off of him. He is still interested in getting assistance w/ applications sent. LCSW shared that since pt has his application pending for Medicaid we would have to wait on any determination before submitting CAFA. Pt does need to sign referral form for [facility] so we can get that referral submitted to them and disability application started. Upcoming surgery w/ Dr. Cliffton Asters on 8/15. Pt would like to work on application before surgery. LCSW has arranged a ride w/ Ephriam Knuckles, Presenter, broadcasting w/ [employer], for pt on 8/12 at 11am for him to come and meet with me at the office to complete forms. Pt has received some additional paperwork from Medicaid and he was encouraged to call caseworker directly to complete and return that to them. Remain available for any additional questions/concerns prior to our meeting on Friday.    [name], MSW, LCSW [facility] Navigation  [phone]

## 2021-01-01 ENCOUNTER — Telehealth: Payer: Self-pay | Admitting: *Deleted

## 2021-01-01 ENCOUNTER — Ambulatory Visit: Payer: Self-pay | Admitting: Physician Assistant

## 2021-01-01 NOTE — Telephone Encounter (Signed)
Mr. Thetford contacted the office requesting something for pain. Patient c/o unbearable sternal pain. He is scheduled for sternal plating by Dr. Cliffton Asters on 8/15. Patient instructed to continue to take Tylenol and Ibuprofen for pain as recommended by Dr. Cliffton Asters. Patient states medication does not help. Patient states he will seek care via the emergency room.

## 2021-01-01 NOTE — Telephone Encounter (Signed)
Patient calling to check on status.

## 2021-01-01 NOTE — Progress Notes (Signed)
Surgical Instructions    Your procedure is scheduled on Monday August 15th.  Report to [facility] Main Entrance "A" at 10 A.M., then check in with the Admitting office.  Call this number if you have problems the morning of surgery:  [phone]   If you have any questions prior to your surgery date call [phone]: Open Monday-Friday 8am-4pm    Remember:  Do not eat or drink after midnight the night before your surgery     Take these medicines the morning of surgery with A SIP OF WATER  atorvastatin (LIPITOR) 80 MG tablet carvedilol (COREG) 6.25 MG tablet oxyCODONE-acetaminophen (PERCOCET/ROXICET) 5-325 MG tablet if needed   As of today, STOP taking any Aspirin (unless otherwise instructed by your surgeon) Aleve, Naproxen, Ibuprofen, Motrin, Advil, Goody's, BC's, all herbal medications, fish oil, and all vitamins.   Follow your surgeon's instructions on when to stop Aspirin.  If no instructions were given by your surgeon then you will need to call the office to get those instructions.           Do not wear jewelry  Do not wear lotions, powders, colognes, or deodorant. Do not shave 48 hours prior to surgery.  Men may shave face and neck. Do not bring valuables to the hospital. DO Not wear nail polish, gel polish, artificial nails, or any other type of covering on  natural nails including finger and toenails. If patients have artificial nails, gel coating, etc. that need to be removed by a nail salon please have this removed prior to surgery or surgery may need to be canceled/delayed if the surgeon/ anesthesia feels like the patient is unable to be adequately monitored.             [facility] is not responsible for any belongings or valuables.  Do NOT Smoke (Tobacco/Vaping) or drink Alcohol 24 hours prior to your procedure If you use a CPAP at night, you may bring all equipment for your overnight stay.   Contacts, glasses, dentures or bridgework may not be worn into surgery,  please bring cases for these belongings   For patients admitted to the hospital, discharge time will be determined by your treatment team.   Patients discharged the day of surgery will not be allowed to drive home, and someone needs to stay with them for 24 hours.  ONLY 1 SUPPORT PERSON MAY BE PRESENT WHILE YOU ARE IN SURGERY. IF YOU ARE TO BE ADMITTED ONCE YOU ARE IN YOUR ROOM YOU WILL BE ALLOWED TWO (2) VISITORS.  Minor children may have two parents present. Special consideration for safety and communication needs will be reviewed on a case by case basis.  Special instructions:    Oral Hygiene is also important to reduce your risk of infection.  Remember - BRUSH YOUR TEETH THE MORNING OF SURGERY WITH YOUR REGULAR TOOTHPASTE   [facility]- Preparing For Surgery  Before surgery, you can play an important role. Because skin is not sterile, your skin needs to be as free of germs as possible. You can reduce the number of germs on your skin by washing with CHG (chlorahexidine gluconate) Soap before surgery.  CHG is an antiseptic cleaner which kills germs and bonds with the skin to continue killing germs even after washing.     Please do not use if you have an allergy to CHG or antibacterial soaps. If your skin becomes reddened/irritated stop using the CHG.  Do not shave (including legs and underarms) for at least 48  hours prior to first CHG shower. It is OK to shave your face.  Please follow these instructions carefully.     Shower the NIGHT BEFORE SURGERY and the MORNING OF SURGERY with CHG Soap.   If you chose to wash your hair, wash your hair first as usual with your normal shampoo. After you shampoo, rinse your hair and body thoroughly to remove the shampoo.  Then [employer] and genitals (private parts) with your normal soap and rinse thoroughly to remove soap.  After that Use CHG Soap as you would any other liquid soap. You can apply CHG directly to the skin and wash gently with a scrungie  or a clean washcloth.   Apply the CHG Soap to your body ONLY FROM THE NECK DOWN.  Do not use on open wounds or open sores. Avoid contact with your eyes, ears, mouth and genitals (private parts). Wash Face and genitals (private parts)  with your normal soap.   Wash thoroughly, paying special attention to the area where your surgery will be performed.  Thoroughly rinse your body with warm water from the neck down.  DO NOT shower/wash with your normal soap after using and rinsing off the CHG Soap.  Pat yourself dry with a CLEAN TOWEL.  Wear CLEAN PAJAMAS to bed the night before surgery  Place CLEAN SHEETS on your bed the night before your surgery  DO NOT SLEEP WITH PETS.   Day of Surgery:  Take a shower with CHG soap. Wear Clean/Comfortable clothing the morning of surgery Do not apply any deodorants/lotions.   Remember to brush your teeth WITH YOUR REGULAR TOOTHPASTE.   Please read over the following fact sheets that you were given.

## 2021-01-01 NOTE — Telephone Encounter (Signed)
Spoke with the patient. Adv the patient that Dr. Kirke Corin cannot provide a refill for oxycodone. The initial script was a courtesy and no further refills can be granted. (Patient has been told this several times).  Adv him that the [facility] medical board requires a narcotics contract for narcotic prescription for more than 2 weeks. That is not something we do.  We have tried to refer the patient to pain management @ [place], [place], and [place]. All programs denied him stating that he is to high risk.  Patient sts that the pain is unbearable and if he does not get relief he will go to the ED. Patients pain is surgical related. Recommended he contact [facility] to discuss. Patient sts that he did rqst pain prescription from the surgeon office previously and was denied. He will reach out to the surgeons office again.

## 2021-01-02 ENCOUNTER — Encounter (HOSPITAL_COMMUNITY)
Admission: RE | Admit: 2021-01-02 | Discharge: 2021-01-02 | Disposition: A | Discharge status: Home / Self Care | Payer: Medicaid Other | Source: Ambulatory Visit | Attending: Thoracic Surgery (Cardiothoracic Vascular Surgery) | Admitting: Thoracic Surgery (Cardiothoracic Vascular Surgery)

## 2021-01-02 ENCOUNTER — Other Ambulatory Visit: Payer: Self-pay

## 2021-01-02 ENCOUNTER — Encounter (HOSPITAL_COMMUNITY): Payer: Self-pay

## 2021-01-02 DIAGNOSIS — G8929 Other chronic pain: Secondary | ICD-10-CM | POA: Diagnosis not present

## 2021-01-02 DIAGNOSIS — Z20822 Contact with and (suspected) exposure to covid-19: Secondary | ICD-10-CM | POA: Insufficient documentation

## 2021-01-02 DIAGNOSIS — E785 Hyperlipidemia, unspecified: Secondary | ICD-10-CM | POA: Insufficient documentation

## 2021-01-02 DIAGNOSIS — Z720 Tobacco use: Secondary | ICD-10-CM | POA: Insufficient documentation

## 2021-01-02 DIAGNOSIS — Z01812 Encounter for preprocedural laboratory examination: Secondary | ICD-10-CM | POA: Insufficient documentation

## 2021-01-02 DIAGNOSIS — F1410 Cocaine abuse, uncomplicated: Secondary | ICD-10-CM | POA: Diagnosis not present

## 2021-01-02 DIAGNOSIS — F1510 Other stimulant abuse, uncomplicated: Secondary | ICD-10-CM | POA: Diagnosis not present

## 2021-01-02 DIAGNOSIS — I10 Essential (primary) hypertension: Secondary | ICD-10-CM | POA: Insufficient documentation

## 2021-01-02 DIAGNOSIS — F419 Anxiety disorder, unspecified: Secondary | ICD-10-CM | POA: Insufficient documentation

## 2021-01-02 DIAGNOSIS — I2510 Atherosclerotic heart disease of native coronary artery without angina pectoris: Secondary | ICD-10-CM | POA: Diagnosis not present

## 2021-01-02 DIAGNOSIS — E1165 Type 2 diabetes mellitus with hyperglycemia: Secondary | ICD-10-CM | POA: Insufficient documentation

## 2021-01-02 DIAGNOSIS — R0789 Other chest pain: Secondary | ICD-10-CM

## 2021-01-02 DIAGNOSIS — Z951 Presence of aortocoronary bypass graft: Secondary | ICD-10-CM | POA: Diagnosis not present

## 2021-01-02 HISTORY — DX: Unspecified asthma, uncomplicated: J45.909

## 2021-01-02 HISTORY — DX: Heart failure, unspecified: I50.9

## 2021-01-02 HISTORY — DX: Essential (primary) hypertension: I10

## 2021-01-02 LAB — URINALYSIS, ROUTINE W REFLEX MICROSCOPIC
Bilirubin Urine: NEGATIVE
Glucose, UA: NEGATIVE mg/dL
Hgb urine dipstick: NEGATIVE
Ketones, ur: NEGATIVE mg/dL
Leukocytes,Ua: NEGATIVE
Nitrite: NEGATIVE
Protein, ur: NEGATIVE mg/dL
Specific Gravity, Urine: 1.016 (ref 1.005–1.030)
pH: 5 (ref 5.0–8.0)

## 2021-01-02 LAB — SURGICAL PCR SCREEN
MRSA, PCR: NEGATIVE
Staphylococcus aureus: POSITIVE — AB

## 2021-01-02 LAB — BLOOD GAS, ARTERIAL
Acid-base deficit: 2.4 mmol/L — ABNORMAL HIGH (ref 0.0–2.0)
Bicarbonate: 22.2 mmol/L (ref 20.0–28.0)
Drawn by: 58793
FIO2: 21
O2 Saturation: 97.2 %
Patient temperature: 37
pCO2 arterial: 40.2 mmHg (ref 32.0–48.0)
pH, Arterial: 7.361 (ref 7.350–7.450)
pO2, Arterial: 95.9 mmHg (ref 83.0–108.0)

## 2021-01-02 LAB — COMPREHENSIVE METABOLIC PANEL
ALT: 23 U/L (ref 0–44)
AST: 24 U/L (ref 15–41)
Albumin: 3.8 g/dL (ref 3.5–5.0)
Alkaline Phosphatase: 47 U/L (ref 38–126)
Anion gap: 8 (ref 5–15)
BUN: 11 mg/dL (ref 6–20)
CO2: 21 mmol/L — ABNORMAL LOW (ref 22–32)
Calcium: 9.2 mg/dL (ref 8.9–10.3)
Chloride: 107 mmol/L (ref 98–111)
Creatinine, Ser: 0.83 mg/dL (ref 0.61–1.24)
GFR, Estimated: >60 mL/min (ref >60)
Glucose, Bld: 98 mg/dL (ref 70–99)
Potassium: 4.4 mmol/L (ref 3.5–5.1)
Sodium: 136 mmol/L (ref 135–145)
Total Bilirubin: 0.3 mg/dL (ref 0.3–1.2)
Total Protein: 7.2 g/dL (ref 6.5–8.1)

## 2021-01-02 LAB — CBC
HCT: 45.4 % (ref 39.0–52.0)
Hemoglobin: 14.7 g/dL (ref 13.0–17.0)
MCH: 31.3 pg (ref 26.0–34.0)
MCHC: 32.4 g/dL (ref 30.0–36.0)
MCV: 96.8 fL (ref 80.0–100.0)
Platelets: 226 10*3/uL (ref 150–400)
RBC: 4.69 MIL/uL (ref 4.22–5.81)
RDW: 14.3 % (ref 11.5–15.5)
WBC: 8.6 10*3/uL (ref 4.0–10.5)
nRBC: 0 % (ref 0.0–0.2)

## 2021-01-02 LAB — PROTIME-INR
INR: 1 (ref 0.8–1.2)
Prothrombin Time: 13.3 seconds (ref 11.4–15.2)

## 2021-01-02 LAB — SARS CORONAVIRUS 2 (TAT 6-24 HRS): SARS Coronavirus 2: NEGATIVE

## 2021-01-02 LAB — TYPE AND SCREEN
ABO/RH(D): A POS
Antibody Screen: NEGATIVE

## 2021-01-02 LAB — APTT: aPTT: 30 seconds (ref 24–36)

## 2021-01-02 NOTE — Progress Notes (Signed)
PCP - Grayling Congress FNP Cardiologist - Dr. Terrilee Files with [facility]  Chest x-ray - Day of surgery EKG - 12/30/20 Stress Test - Denies ECHO - 10/04/20 Cardiac Cath - 05/20/20  Sleep Study - Denies  DM - Denies  Aspirin Instructions: Asked to call Dr. Lucilla Lame office for instruction on when to stop.   COVID TEST-  01/02/21   Anesthesia review: Yes Cardiac history and recent stroke on 10/03/20.  Patient denies shortness of breath, fever, cough and chest pain at PAT appointment   All instructions explained to the patient, with a verbal understanding of the material. Patient agrees to go over the instructions while at home for a better understanding. Patient also instructed to wear mask in public after being tested for COVID-19. The opportunity to ask questions was provided.

## 2021-01-02 NOTE — Progress Notes (Signed)
   01/02/21 1335  OBSTRUCTIVE SLEEP APNEA  Have you ever been diagnosed with sleep apnea through a sleep study? No  Do you snore loudly (loud enough to be heard through closed doors)?  0  Do you often feel tired, fatigued, or sleepy during the daytime (such as falling asleep during driving or talking to someone)? 0  Has anyone observed you stop breathing during your sleep? 0  Do you have, or are you being treated for high blood pressure? 1  BMI more than 35 kg/m2? 1  Age > 50 (1-yes) 1  Neck circumference greater than:Male 16 inches or larger, Male 17inches or larger? 1  Male Gender (Yes=1) 1  Obstructive Sleep Apnea Score 5  Score 5 or greater  Results sent to PCP

## 2021-01-03 ENCOUNTER — Other Ambulatory Visit: Payer: Self-pay

## 2021-01-03 ENCOUNTER — Telehealth: Payer: Self-pay | Admitting: Licensed Clinical Social Worker

## 2021-01-03 NOTE — Progress Notes (Signed)
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 He was hospitalized in May with mental status changes.  MRI showed patchy multifocal acute ischemic infarcts.  He was dehydrated improved with IV fluids.  Urine tox screen was negative. Echo bubble study during that time showed LVEF 40 to 45%. Went to the ER 11/23/2020 for chest pain.Troponin was negative. Work-up for ACS was negative. Patient was seen 11/28/2020 by  Dr. Kirke Corin for ER follow-up.  It was felt his chest pain was musculoskeletal and it was recommended  he see CT surgeon. Medical management was continued. Saw Dr. 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 Plan for sternal plating. He wants diabetes to be more under control before surgery, however patient does not have PCP given lack of insurance. Hgb A1C 6.26 Sep 2020. Seen 8/3/2 in the ED for edema and shortness of breath. CXR showed clear lungs. BNP 236. Spoke with cardiothoracic sugeon who did not recommend intervention. They gave him lasix 20mg  daily.   Last seen by cardiology, Cadence , PA-C on 12/27/2020.  Breathing was reportedly better, trace lower leg edema on exam.  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  Discussed that sternal plating was being planned with cardiothoracic surgery.  Prediabetic, last A1c 6.0 on 12/27/2020.  Preop labs reviewed, unremarkable.  EKG 12/30/2020: NSR.  Rate 79.  Inferior infarct, age undetermined.  CT chest 12/17/2020: IMPRESSION: No acute abnormality is noted in the chest.   Hepatic steatosis.   Mild cholelithiasis.  Echo with bubble study 10/04/2020:  1. Left ventricular ejection fraction, by estimation, is 40 to 45%. The  left ventricle has normal function. The left ventricle  demonstrates  regional wall motion abnormalities (see scoring diagram/findings for  description). Left ventricular diastolic  parameters were normal.   2. Right ventricular systolic function is normal. The right ventricular  size is normal.   3. The mitral valve is normal in structure. Mild to moderate mitral valve  regurgitation.   4. The aortic valve is normal in structure. Aortic valve regurgitation is  trivial.    10/06/2020 [facility] Short Stay Center/Anesthesiology Phone [phone] 01/03/2021 10:23 AM

## 2021-01-03 NOTE — Anesthesia Preprocedure Evaluation (Addendum)
Anesthesia Evaluation  Patient identified by MRN, date of birth, ID band Patient awake    Reviewed: Allergy & Precautions, H&P , NPO status , Patient's Chart, lab work & pertinent test results, reviewed documented beta blocker date and time   Airway Mallampati: III  TM Distance: >3 FB Neck ROM: Full    Dental no notable dental hx. (+) Edentulous Upper, Edentulous Lower, Dental Advisory Given   Pulmonary asthma , Current SmokerPatient did not abstain from smoking.,    Pulmonary exam normal breath sounds clear to auscultation       Cardiovascular hypertension, Pt. on medications and Pt. on home beta blockers + CAD, + Past MI and +CHF   Rhythm:Regular Rate:Normal     Neuro/Psych Anxiety CVA    GI/Hepatic negative GI ROS, Neg liver ROS,   Endo/Other  Morbid obesity  Renal/GU negative Renal ROS  negative genitourinary   Musculoskeletal   Abdominal   Peds  Hematology negative hematology ROS (+)   Anesthesia Other Findings   Reproductive/Obstetrics negative OB ROS                         Anesthesia Physical Anesthesia Plan  ASA: 3  Anesthesia Plan: General   Post-op Pain Management:    Induction: Intravenous  PONV Risk Score and Plan: 2 and Ondansetron, Dexamethasone and Midazolam  Airway Management Planned: Oral ETT  Additional Equipment:   Intra-op Plan:   Post-operative Plan: Extubation in OR  Informed Consent: I have reviewed the patients History and Physical, chart, labs and discussed the procedure including the risks, benefits and alternatives for the proposed anesthesia with the patient or authorized representative who has indicated his/her understanding and acceptance.     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 Hewas hospitalized in May with mental status changes. MRI showed patchy multifocal acute ischemic infarcts. He was dehydrated improved with IV fluids. Urine tox screen was negative. Echo bubble study during that time showed LVEF 40 to 45%. Went to the ER 11/23/2020 for chest pain.Troponin was negative.Work-up for ACS was negative. Patient was seen 7/7/2022by Dr. Kirke Corin for ER follow-up. It was felt his chest pain was musculoskeletal and it was recommended he see CT surgeon. Medical management was continued. Saw Dr. 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 Seen 8/3/2 in the ED for edema and shortness of breath.CXR showed clear lungs.BNP 236. Spoke with cardiothoracic sugeon who did not recommend intervention. They gave him lasix 20mg  daily.   Last seen by cardiology, Cadence , PA-C on 12/27/2020.  Breathing was reportedly better, trace lower leg edema on exam.  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  Discussed that sternal plating was being planned with cardiothoracic surgery.  Prediabetic, last A1c 6.0 on 12/27/2020.  Preop labs reviewed, unremarkable.  EKG 12/30/2020: NSR.  Rate 79.  Inferior infarct, age undetermined.  CT chest 12/17/2020: IMPRESSION: No acute abnormality is noted in the chest.  Hepatic steatosis.  Mild cholelithiasis.  Echo with bubble study 10/04/2020: 1. Left  ventricular ejection fraction, by estimation, is 40 to 45%. The  left ventricle has normal function. The left ventricle demonstrates  regional wall motion abnormalities (see scoring diagram/findings for  description). Left ventricular  diastolic  parameters were normal.  2. Right ventricular systolic function is normal. The right ventricular  size is normal.  3. The mitral valve is normal in structure. Mild to moderate mitral valve  regurgitation.  4. The aortic valve is normal in structure. Aortic valve regurgitation is  trivial.   )       Anesthesia Quick Evaluation

## 2021-01-03 NOTE — Telephone Encounter (Signed)
Pt called stating he would be late for meeting today at [facility] office. Inquired why as ride had been arranged by this writer to pick him up at 11. Pt states his daughter plans to take him to office. Pt also has additional questions about application for medication assistance- shared that I could assist with those but likely they would need to go through that office to confirm application done correctly. Plan is still to meet this afternoon around 11:30am.   Matthew Tapia, MSW, LCSW [facility] Heart/Vascular Care Navigation  [phone]

## 2021-01-03 NOTE — Telephone Encounter (Signed)
LCSW met with pt at [facility] office this afternoon.  He brought with him his CAFA application, papers from his Medicaid worker, release form for [employer], and an application for his Medication Management Program.   LCSW assisted pt with completing application for Medication Management Program and noting what pt needed to submit. He plans to go over there this afternoon and will turn in application.   We also had him sign and complete the release of information for disability and the Medicaid workers. Those were respectively emailed securely to [facility] and faxed/mailed to [place]. Pt was given copies of both and placed back in envelope he brought.   We went over the CAFA application [facility]) and pt was given cover sheet/reminder that when he hears a determination from Medicaid to let me know. If he is denied then he may be eligible for [employer] . Pt states understanding. He has my number and was encouraged to f/u with any additional questions/concerns. He has an upcoming procedure with Dr. Kipp Brood ([facility]) for Sternal Plating on Monday. I will f/u for any additional outpatient needs at that time.    Westley Hummer, MSW, [place]  [phone]

## 2021-01-03 NOTE — Progress Notes (Signed)
Left messages for patient and his daughter of new arrival time of 1200. Spoke with patient's mother who voiced understanding of new arrival time of 1200 on Monday. She said she would tell her son.

## 2021-01-06 ENCOUNTER — Inpatient Hospital Stay (HOSPITAL_COMMUNITY): Payer: Medicaid Other | Admitting: Physician Assistant

## 2021-01-06 ENCOUNTER — Encounter (HOSPITAL_COMMUNITY): Payer: Self-pay | Admitting: Thoracic Surgery (Cardiothoracic Vascular Surgery)

## 2021-01-06 ENCOUNTER — Other Ambulatory Visit: Payer: Self-pay

## 2021-01-06 ENCOUNTER — Ambulatory Visit: Payer: Self-pay | Admitting: Pharmacy Technician

## 2021-01-06 ENCOUNTER — Inpatient Hospital Stay (HOSPITAL_COMMUNITY): Payer: Medicaid Other | Admitting: Anesthesiology

## 2021-01-06 ENCOUNTER — Inpatient Hospital Stay (HOSPITAL_COMMUNITY)
Admission: RE | Admit: 2021-01-06 | Discharge: 2021-01-09 | DRG: 516 | Disposition: A | Discharge status: Home / Self Care | Payer: Medicaid Other | Attending: Thoracic Surgery (Cardiothoracic Vascular Surgery) | Admitting: Thoracic Surgery (Cardiothoracic Vascular Surgery)

## 2021-01-06 ENCOUNTER — Inpatient Hospital Stay (HOSPITAL_COMMUNITY): Payer: Medicaid Other

## 2021-01-06 ENCOUNTER — Encounter (HOSPITAL_COMMUNITY)
Admission: RE | Disposition: A | Discharge status: Home / Self Care | Payer: Self-pay | Source: Home / Self Care | Attending: Thoracic Surgery (Cardiothoracic Vascular Surgery)

## 2021-01-06 DIAGNOSIS — T849XXA Unspecified complication of internal orthopedic prosthetic device, implant and graft, initial encounter: Secondary | ICD-10-CM

## 2021-01-06 DIAGNOSIS — Z951 Presence of aortocoronary bypass graft: Secondary | ICD-10-CM | POA: Diagnosis not present

## 2021-01-06 DIAGNOSIS — Z9119 Patient's noncompliance with other medical treatment and regimen: Secondary | ICD-10-CM | POA: Diagnosis not present

## 2021-01-06 DIAGNOSIS — Z01818 Encounter for other preprocedural examination: Secondary | ICD-10-CM

## 2021-01-06 DIAGNOSIS — Z79899 Other long term (current) drug therapy: Secondary | ICD-10-CM | POA: Diagnosis not present

## 2021-01-06 DIAGNOSIS — F1110 Opioid abuse, uncomplicated: Secondary | ICD-10-CM | POA: Diagnosis present

## 2021-01-06 DIAGNOSIS — G8929 Other chronic pain: Secondary | ICD-10-CM | POA: Diagnosis present

## 2021-01-06 DIAGNOSIS — I255 Ischemic cardiomyopathy: Secondary | ICD-10-CM | POA: Diagnosis present

## 2021-01-06 DIAGNOSIS — E119 Type 2 diabetes mellitus without complications: Secondary | ICD-10-CM | POA: Diagnosis present

## 2021-01-06 DIAGNOSIS — S2223XA Sternal manubrial dissociation, initial encounter for closed fracture: Secondary | ICD-10-CM | POA: Diagnosis present

## 2021-01-06 DIAGNOSIS — Y839 Surgical procedure, unspecified as the cause of abnormal reaction of the patient, or of later complication, without mention of misadventure at the time of the procedure: Secondary | ICD-10-CM | POA: Diagnosis present

## 2021-01-06 DIAGNOSIS — R0789 Other chest pain: Secondary | ICD-10-CM

## 2021-01-06 DIAGNOSIS — S2223XK Sternal manubrial dissociation, subsequent encounter for fracture with nonunion: Secondary | ICD-10-CM | POA: Diagnosis present

## 2021-01-06 DIAGNOSIS — D72829 Elevated white blood cell count, unspecified: Secondary | ICD-10-CM | POA: Diagnosis not present

## 2021-01-06 DIAGNOSIS — I110 Hypertensive heart disease with heart failure: Secondary | ICD-10-CM | POA: Diagnosis present

## 2021-01-06 DIAGNOSIS — I5042 Chronic combined systolic (congestive) and diastolic (congestive) heart failure: Secondary | ICD-10-CM | POA: Diagnosis present

## 2021-01-06 DIAGNOSIS — R062 Wheezing: Secondary | ICD-10-CM | POA: Diagnosis not present

## 2021-01-06 DIAGNOSIS — R0602 Shortness of breath: Secondary | ICD-10-CM | POA: Diagnosis not present

## 2021-01-06 DIAGNOSIS — M25519 Pain in unspecified shoulder: Secondary | ICD-10-CM | POA: Diagnosis present

## 2021-01-06 DIAGNOSIS — Z79891 Long term (current) use of opiate analgesic: Secondary | ICD-10-CM

## 2021-01-06 HISTORY — PX: RIB PLATING: SHX5079

## 2021-01-06 SURGERY — FIXATION, RIB, USING PLATE
Anesthesia: General

## 2021-01-06 MED ORDER — ORAL CARE MOUTH RINSE: 15.0000 mL | Freq: Once | OROMUCOSAL | Status: AC

## 2021-01-06 MED ORDER — CEFAZOLIN SODIUM-DEXTROSE 2-4 GM/100ML-% IV SOLN
2.0000 g | INTRAVENOUS | Status: AC
  Administered 2021-01-06: 2 g via INTRAVENOUS

## 2021-01-06 MED ORDER — SUGAMMADEX SODIUM 200 MG/2ML IV SOLN
INTRAVENOUS | Status: DC | PRN
  Administered 2021-01-06: 300 mg via INTRAVENOUS

## 2021-01-06 MED ORDER — MORPHINE SULFATE (PF) 2 MG/ML IV SOLN
1.0000 mg | INTRAVENOUS | Status: DC | PRN
Start: 2021-01-06 — End: 2021-01-09
  Administered 2021-01-06 – 2021-01-09 (×13): 1 mg via INTRAVENOUS
  Filled 2021-01-06 (×13): qty 1

## 2021-01-06 MED ORDER — PROPOFOL 10 MG/ML IV BOLUS
INTRAVENOUS | Status: DC | PRN
  Administered 2021-01-06: 140 mg via INTRAVENOUS

## 2021-01-06 MED ORDER — ONDANSETRON HCL 4 MG/2ML IJ SOLN
INTRAMUSCULAR | Status: AC
  Filled 2021-01-06: qty 2

## 2021-01-06 MED ORDER — ALBUTEROL SULFATE HFA 108 (90 BASE) MCG/ACT IN AERS
INHALATION_SPRAY | RESPIRATORY_TRACT | Status: DC | PRN
  Administered 2021-01-06: 4 via RESPIRATORY_TRACT

## 2021-01-06 MED ORDER — DIPHENHYDRAMINE HCL 50 MG/ML IJ SOLN: 12.5000 mg | Freq: Four times a day (QID) | INTRAMUSCULAR | Status: DC | PRN

## 2021-01-06 MED ORDER — DEXAMETHASONE SODIUM PHOSPHATE 10 MG/ML IJ SOLN
INTRAMUSCULAR | Status: AC
  Filled 2021-01-06: qty 1

## 2021-01-06 MED ORDER — NALOXONE HCL 0.4 MG/ML IJ SOLN: 0.4000 mg | INTRAMUSCULAR | Status: DC | PRN

## 2021-01-06 MED ORDER — OXYCODONE HCL 5 MG PO TABS
10.0000 mg | ORAL_TABLET | Freq: Four times a day (QID) | ORAL | Status: DC | PRN
  Administered 2021-01-06: 10 mg via ORAL
  Filled 2021-01-06: qty 2

## 2021-01-06 MED ORDER — HYDROMORPHONE HCL 1 MG/ML IJ SOLN
0.2500 mg | INTRAMUSCULAR | Status: DC | PRN
  Administered 2021-01-06 (×4): 0.5 mg via INTRAVENOUS

## 2021-01-06 MED ORDER — ONDANSETRON HCL 4 MG/2ML IJ SOLN
INTRAMUSCULAR | Status: DC | PRN
  Administered 2021-01-06: 4 mg via INTRAVENOUS

## 2021-01-06 MED ORDER — CHLORHEXIDINE GLUCONATE 0.12 % MT SOLN: 15.0000 mL | Freq: Once | OROMUCOSAL | Status: AC

## 2021-01-06 MED ORDER — HYDROMORPHONE HCL 1 MG/ML IJ SOLN
INTRAMUSCULAR | Status: AC
  Filled 2021-01-06: qty 1

## 2021-01-06 MED ORDER — DIPHENHYDRAMINE HCL 12.5 MG/5ML PO ELIX: 12.5000 mg | ORAL_SOLUTION | Freq: Four times a day (QID) | ORAL | Status: DC | PRN

## 2021-01-06 MED ORDER — HYDROMORPHONE HCL 1 MG/ML IJ SOLN
0.5000 mg | INTRAMUSCULAR | Status: AC | PRN
  Administered 2021-01-06 (×2): 0.5 mg via INTRAVENOUS

## 2021-01-06 MED ORDER — FENTANYL CITRATE (PF) 100 MCG/2ML IJ SOLN
INTRAMUSCULAR | Status: DC | PRN
  Administered 2021-01-06 (×2): 50 ug via INTRAVENOUS

## 2021-01-06 MED ORDER — ACETAMINOPHEN 500 MG PO TABS: 1000.0000 mg | ORAL_TABLET | Freq: Once | ORAL | Status: AC

## 2021-01-06 MED ORDER — ROCURONIUM BROMIDE 10 MG/ML (PF) SYRINGE
PREFILLED_SYRINGE | INTRAVENOUS | Status: AC
  Filled 2021-01-06: qty 10

## 2021-01-06 MED ORDER — LIDOCAINE 2% (20 MG/ML) 5 ML SYRINGE
INTRAMUSCULAR | Status: DC | PRN
  Administered 2021-01-06: 60 mg via INTRAVENOUS

## 2021-01-06 MED ORDER — PHENYLEPHRINE HCL-NACL 20-0.9 MG/250ML-% IV SOLN
INTRAVENOUS | Status: DC | PRN
  Administered 2021-01-06: 25 ug/min via INTRAVENOUS

## 2021-01-06 MED ORDER — ONDANSETRON HCL 4 MG/2ML IJ SOLN: 4.0000 mg | Freq: Four times a day (QID) | INTRAMUSCULAR | Status: DC | PRN

## 2021-01-06 MED ORDER — HYDROMORPHONE 1 MG/ML IV SOLN
INTRAVENOUS | Status: DC
  Administered 2021-01-06: 1.2 mg via INTRAVENOUS
  Administered 2021-01-06: 30 mg via INTRAVENOUS
  Filled 2021-01-06: qty 30

## 2021-01-06 MED ORDER — CEFAZOLIN SODIUM-DEXTROSE 2-4 GM/100ML-% IV SOLN
INTRAVENOUS | Status: AC
  Filled 2021-01-06: qty 100

## 2021-01-06 MED ORDER — SODIUM CHLORIDE 0.9% FLUSH: 9.0000 mL | INTRAVENOUS | Status: DC | PRN

## 2021-01-06 MED ORDER — DEXAMETHASONE SODIUM PHOSPHATE 10 MG/ML IJ SOLN
INTRAMUSCULAR | Status: DC | PRN
Start: 2021-01-06 — End: 2021-01-06
  Administered 2021-01-06: 4 mg via INTRAVENOUS

## 2021-01-06 MED ORDER — LIDOCAINE 2% (20 MG/ML) 5 ML SYRINGE
INTRAMUSCULAR | Status: AC
  Filled 2021-01-06: qty 5

## 2021-01-06 MED ORDER — HYDROMORPHONE HCL 1 MG/ML IJ SOLN
0.5000 mg | INTRAMUSCULAR | Status: DC | PRN
  Administered 2021-01-06: 0.5 mg via INTRAVENOUS
  Filled 2021-01-06: qty 0.5

## 2021-01-06 MED ORDER — OXYCODONE HCL 5 MG PO TABS: 5.0000 mg | ORAL_TABLET | Freq: Four times a day (QID) | ORAL | Status: DC | PRN

## 2021-01-06 MED ORDER — SODIUM CHLORIDE 0.9 % IR SOLN
Status: DC | PRN
  Administered 2021-01-06: 2000 mL

## 2021-01-06 MED ORDER — MIDAZOLAM HCL 5 MG/5ML IJ SOLN
INTRAMUSCULAR | Status: DC | PRN
  Administered 2021-01-06: 2 mg via INTRAVENOUS

## 2021-01-06 MED ORDER — OXYCODONE-ACETAMINOPHEN 5-325 MG PO TABS
1.0000 | ORAL_TABLET | Freq: Four times a day (QID) | ORAL | Status: DC | PRN
  Administered 2021-01-06 – 2021-01-07 (×3): 1 via ORAL
  Filled 2021-01-06 (×3): qty 1

## 2021-01-06 MED ORDER — ROCURONIUM BROMIDE 10 MG/ML (PF) SYRINGE
PREFILLED_SYRINGE | INTRAVENOUS | Status: DC | PRN
  Administered 2021-01-06 (×2): 20 mg via INTRAVENOUS
  Administered 2021-01-06: 30 mg via INTRAVENOUS
  Administered 2021-01-06: 70 mg via INTRAVENOUS

## 2021-01-06 MED ORDER — ACETAMINOPHEN 500 MG PO TABS
ORAL_TABLET | ORAL | Status: AC
  Administered 2021-01-06: 1000 mg via ORAL
  Filled 2021-01-06: qty 2

## 2021-01-06 MED ORDER — PROPOFOL 10 MG/ML IV BOLUS
INTRAVENOUS | Status: AC
  Filled 2021-01-06: qty 20

## 2021-01-06 MED ORDER — MIDAZOLAM HCL 2 MG/2ML IJ SOLN
INTRAMUSCULAR | Status: AC
  Filled 2021-01-06: qty 2

## 2021-01-06 MED ORDER — LACTATED RINGERS IV SOLN: INTRAVENOUS | Status: DC | PRN

## 2021-01-06 MED ORDER — LACTATED RINGERS IV SOLN: INTRAVENOUS | Status: DC

## 2021-01-06 MED ORDER — CHLORHEXIDINE GLUCONATE 0.12 % MT SOLN
OROMUCOSAL | Status: AC
  Administered 2021-01-06: 15 mL via OROMUCOSAL
  Filled 2021-01-06: qty 15

## 2021-01-06 MED ORDER — FENTANYL CITRATE (PF) 250 MCG/5ML IJ SOLN
INTRAMUSCULAR | Status: AC
  Filled 2021-01-06: qty 5

## 2021-01-06 SURGICAL SUPPLY — 64 items
ATTRACTOMAT 16X20 MAGNETIC DRP (DRAPES) ×2 IMPLANT
BAG DECANTER FOR FLEXI CONT (MISCELLANEOUS) IMPLANT
BATTERY MAXDRIVER (MISCELLANEOUS) ×2 IMPLANT
BENZOIN TINCTURE PRP APPL 2/3 (GAUZE/BANDAGES/DRESSINGS) IMPLANT
BLADE CLIPPER SURG (BLADE) IMPLANT
BLADE SURG 10 STRL SS (BLADE) ×2 IMPLANT
BNDG GAUZE ELAST 4 BULKY (GAUZE/BANDAGES/DRESSINGS) IMPLANT
CANISTER SUCT 3000ML PPV (MISCELLANEOUS) ×2 IMPLANT
CANISTER WOUND CARE 500ML ATS (WOUND CARE) IMPLANT
CATH FOLEY 2WAY SLVR  5CC 16FR (CATHETERS)
CATH FOLEY 2WAY SLVR 5CC 16FR (CATHETERS) IMPLANT
CLIP VESOCCLUDE SM WIDE 24/CT (CLIP) IMPLANT
CNTNR URN SCR LID CUP LEK RST (MISCELLANEOUS) IMPLANT
CONT SPEC 4OZ STRL OR WHT (MISCELLANEOUS)
COVER SURGICAL LIGHT HANDLE (MISCELLANEOUS) ×2 IMPLANT
DRAPE LAPAROSCOPIC ABDOMINAL (DRAPES) ×2 IMPLANT
DRAPE WARM FLUID 44X44 (DRAPES) ×2 IMPLANT
DRSG AQUACEL AG ADV 3.5X14 (GAUZE/BANDAGES/DRESSINGS) ×2 IMPLANT
DRSG VAC ATS LRG SENSATRAC (GAUZE/BANDAGES/DRESSINGS) IMPLANT
DRSG VAC ATS MED SENSATRAC (GAUZE/BANDAGES/DRESSINGS) IMPLANT
DRSG VAC ATS SM SENSATRAC (GAUZE/BANDAGES/DRESSINGS) IMPLANT
ELECT BLADE 4.0 EZ CLEAN MEGAD (MISCELLANEOUS) ×4
ELECT REM PT RETURN 9FT ADLT (ELECTROSURGICAL) ×2
ELECTRODE BLDE 4.0 EZ CLN MEGD (MISCELLANEOUS) ×2 IMPLANT
ELECTRODE REM PT RTRN 9FT ADLT (ELECTROSURGICAL) ×1 IMPLANT
GAUZE 4X4 16PLY ~~LOC~~+RFID DBL (SPONGE) ×2 IMPLANT
GAUZE SPONGE 4X4 12PLY STRL (GAUZE/BANDAGES/DRESSINGS) IMPLANT
GAUZE XEROFORM 5X9 LF (GAUZE/BANDAGES/DRESSINGS) IMPLANT
GLOVE SURG ENC MOIS LTX SZ7 (GLOVE) ×2 IMPLANT
GLOVE SURG ENC MOIS LTX SZ7.5 (GLOVE) ×4 IMPLANT
GLOVE SURG MICRO LTX SZ6.5 (GLOVE) ×2 IMPLANT
GLOVE SURG PR MICRO ENCORE 7 (GLOVE) ×2 IMPLANT
GOWN STRL REUS W/ TWL LRG LVL3 (GOWN DISPOSABLE) ×3 IMPLANT
GOWN STRL REUS W/ TWL XL LVL3 (GOWN DISPOSABLE) ×2 IMPLANT
GOWN STRL REUS W/TWL LRG LVL3 (GOWN DISPOSABLE) ×3
GOWN STRL REUS W/TWL XL LVL3 (GOWN DISPOSABLE) ×2
HANDPIECE INTERPULSE COAX TIP (DISPOSABLE)
HEMOSTAT POWDER SURGIFOAM 1G (HEMOSTASIS) IMPLANT
HEMOSTAT SURGICEL 2X14 (HEMOSTASIS) IMPLANT
KIT BASIN OR (CUSTOM PROCEDURE TRAY) ×2 IMPLANT
KIT TURNOVER KIT B (KITS) ×2 IMPLANT
NS IRRIG 1000ML POUR BTL (IV SOLUTION) ×4 IMPLANT
PACK GENERAL/GYN (CUSTOM PROCEDURE TRAY) ×2 IMPLANT
PAD ARMBOARD 7.5X6 YLW CONV (MISCELLANEOUS) ×4 IMPLANT
PENCIL BUTTON HOLSTER BLD 10FT (ELECTRODE) ×2 IMPLANT
PLATE STERNAL 2.3X208 14H 2-PK (Plate) ×2 IMPLANT
SCREW STERNAL 2.3X17MM (Screw) ×14 IMPLANT
SCREW STERNAL LOCK 2.3MM (Screw) ×2 IMPLANT
SET HNDPC FAN SPRY TIP SCT (DISPOSABLE) IMPLANT
SOL PREP POV-IOD 4OZ 10% (MISCELLANEOUS) IMPLANT
SPONGE T-LAP 18X18 ~~LOC~~+RFID (SPONGE) ×2 IMPLANT
STAPLER VISISTAT 35W (STAPLE) IMPLANT
SUT MNCRL AB 3-0 PS2 18 (SUTURE) ×4 IMPLANT
SUT STEEL STERNAL CCS#1 18IN (SUTURE) ×6 IMPLANT
SUT VIC AB 1 CTX 36 (SUTURE) ×2
SUT VIC AB 1 CTX36XBRD ANBCTR (SUTURE) ×2 IMPLANT
SUT VIC AB 2-0 CTX 27 (SUTURE) ×4 IMPLANT
SUT VIC AB 3-0 X1 27 (SUTURE) IMPLANT
SWAB COLLECTION DEVICE MRSA (MISCELLANEOUS) IMPLANT
SWAB CULTURE ESWAB REG 1ML (MISCELLANEOUS) IMPLANT
SYR 5ML LL (SYRINGE) IMPLANT
TOWEL GREEN STERILE (TOWEL DISPOSABLE) ×2 IMPLANT
TOWEL GREEN STERILE FF (TOWEL DISPOSABLE) ×2 IMPLANT
WATER STERILE IRR 1000ML POUR (IV SOLUTION) ×2 IMPLANT

## 2021-01-06 NOTE — Progress Notes (Signed)
Completed [facility] application and contract.  Patient agreed to all terms of the [facility] contract.    Patient approved to receive medication assistance at [facility] until time for re-certification in 2022, and as long as eligibility criteria continues to be met.    Provided patient with community resource material based on his particular needs.    Cotton Beckley J. Eleesha Purkey Care Manager [facility]  

## 2021-01-06 NOTE — Anesthesia Procedure Notes (Signed)
Procedure Name: Intubation Date/Time: 01/06/2021 1:00 PM Performed by: Lowella Dell, CRNA Pre-anesthesia Checklist: Patient identified, Emergency Drugs available, Suction available and Patient being monitored Patient Re-evaluated:Patient Re-evaluated prior to induction Oxygen Delivery Method: Circle System Utilized Preoxygenation: Pre-oxygenation with 100% oxygen Induction Type: IV induction Ventilation: Oral airway inserted - appropriate to patient size and Two handed mask ventilation required Laryngoscope Size: Mac and 4 Grade View: Grade I Tube type: Oral Tube size: 8.0 mm Number of attempts: 1 Airway Equipment and Method: Stylet Placement Confirmation: ETT inserted through vocal cords under direct vision, positive ETCO2 and breath sounds checked- equal and bilateral Secured at: 22 cm Tube secured with: Tape Dental Injury: Teeth and Oropharynx as per pre-operative assessment

## 2021-01-06 NOTE — Interval H&P Note (Signed)
History and Physical Interval Note:  01/06/2021 12:08 PM  Matthew Tapia  has presented today for surgery, with the diagnosis of STERNAL NONUNION.  The various methods of treatment have been discussed with the patient and family. After consideration of risks, benefits and other options for treatment, the patient has consented to  Procedure(s): STERNAL PLATING (N/A) as a surgical intervention.  The patient's history has been reviewed, patient examined, no change in status, stable for surgery.  I have reviewed the patient's chart and labs.  Questions were answered to the patient's satisfaction.     Malloree Raboin Keane Scrape

## 2021-01-06 NOTE — Op Note (Signed)
      [address]       [name] [address]             [phone]                                          01/06/2021 Patient:  Matthew Tapia Pre-Op Dx: Sternal nonunion   History of CABG Post-op Dx: Same Procedure: KLS plating of the inferior portion of the sternum.  4 screws were used.  Surgeon and Role:      * Corliss Skains, MD - Primary  Anesthesia  general EBL: 50 ml Blood Administration: None   Counts: correct   Indications: 55 year old male status post CABG in March 2022.  He also has a history of substance abuse and went back to active using shortly following his discharge from the hospital.  Over the last several weeks he he has had several coughing and sneezing spells, and is noted increased pain at the lower portion of the sternum.  There is also been some clicking as well.  Cross-sectional imaging did reveal nonunion of the sternum at the inferior border.  He was brought back to the operating theater for plating and rewiring the lower portion of the sternum.  Findings: The upper portion of the sternum along with the manubrium are well-healed.  There was some cartilaginous healing in between the lower portion of the sternum all wires were removed, and the inferior portion of the sternum was mobilized off of the right heart.  KLS plating was secured in the lower portion was rewired.  Operative Technique: The patient was brought to the operative theatre.  Anesthesia was induced, and the patient was prepped and draped in normal sterile fashion.  An appropriate surgical pause was performed, and pre-operative antibiotics were dosed accordingly.  The previous sternotomy incision was re-opened, and all the wires were removed.  The top portion of the sternum was well-healed.  There was a cartilaginous bridge in between the lower portion of the sternum edges.  This was divided with Bovie cautery and the sternal edges were elevated off of the anterior  surface of the right heart.  Once we had achieved adequate mobilization KLS plates were screwed in to each sternal edge, and double wires were used to reapproximate them.  Skin and soft tissue were closed with several layers of absorbable suture.  The patient tolerated the procedure without any immediate complications and was extubated and transferred to the PACU.    [name] Scrape

## 2021-01-06 NOTE — Transfer of Care (Signed)
Immediate Anesthesia Transfer of Care Note  Patient: Matthew Tapia  Procedure(s) Performed: STERNAL PLATING  Patient Location: PACU  Anesthesia Type:General  Level of Consciousness: awake, alert  and oriented  Airway & Oxygen Therapy: Patient Spontanous Breathing and Patient connected to face mask oxygen  Post-op Assessment: Report given to RN, Post -op Vital signs reviewed and stable and Patient moving all extremities X 4  Post vital signs: Reviewed and stable  Last Vitals:  Vitals Value Taken Time  BP 146/91 01/06/21 1452  Temp 36.2 C 01/06/21 1452  Pulse 73 01/06/21 1456  Resp 18 01/06/21 1456  SpO2 92 % 01/06/21 1456  Vitals shown include unvalidated device data.  Last Pain:  Vitals:   01/06/21 1212  TempSrc: Oral  PainSc: 6          Complications: No notable events documented.

## 2021-01-06 NOTE — Anesthesia Postprocedure Evaluation (Signed)
Anesthesia Post Note  Patient: Matthew Tapia  Procedure(s) Performed: STERNAL PLATING     Patient location during evaluation: PACU Anesthesia Type: General Level of consciousness: awake and alert Pain management: pain level controlled Vital Signs Assessment: post-procedure vital signs reviewed and stable Respiratory status: spontaneous breathing, nonlabored ventilation and respiratory function stable Cardiovascular status: blood pressure returned to baseline and stable Postop Assessment: no apparent nausea or vomiting Anesthetic complications: no   No notable events documented.  Last Vitals:  Vitals:   01/06/21 1607 01/06/21 1622  BP: 108/60 101/86  Pulse: 75 80  Resp: 13 19  Temp:  36.4 C  SpO2: 97% 97%           Osmond Steckman,W. EDMOND

## 2021-01-07 ENCOUNTER — Encounter (HOSPITAL_COMMUNITY): Payer: Self-pay | Admitting: Thoracic Surgery (Cardiothoracic Vascular Surgery)

## 2021-01-07 DIAGNOSIS — S2223XK Sternal manubrial dissociation, subsequent encounter for fracture with nonunion: Secondary | ICD-10-CM | POA: Diagnosis present

## 2021-01-07 LAB — CBC
HCT: 42.3 % (ref 39.0–52.0)
Hemoglobin: 13.7 g/dL (ref 13.0–17.0)
MCH: 31.8 pg (ref 26.0–34.0)
MCHC: 32.4 g/dL (ref 30.0–36.0)
MCV: 98.1 fL (ref 80.0–100.0)
Platelets: 228 10*3/uL (ref 150–400)
RBC: 4.31 MIL/uL (ref 4.22–5.81)
RDW: 13.9 % (ref 11.5–15.5)
WBC: 19.4 10*3/uL — ABNORMAL HIGH (ref 4.0–10.5)
nRBC: 0 % (ref 0.0–0.2)

## 2021-01-07 LAB — CREATININE, SERUM
Creatinine, Ser: 0.79 mg/dL (ref 0.61–1.24)
GFR, Estimated: >60 mL/min (ref >60)

## 2021-01-07 MED ORDER — LIDOCAINE 5 % EX PTCH
2.0000 | MEDICATED_PATCH | CUTANEOUS | Status: DC
  Administered 2021-01-07 – 2021-01-09 (×3): 2 via TRANSDERMAL
  Filled 2021-01-07 (×3): qty 2

## 2021-01-07 MED ORDER — LEVALBUTEROL HCL 0.63 MG/3ML IN NEBU
0.6300 mg | INHALATION_SOLUTION | Freq: Three times a day (TID) | RESPIRATORY_TRACT | Status: DC
  Administered 2021-01-07 – 2021-01-09 (×7): 0.63 mg via RESPIRATORY_TRACT
  Filled 2021-01-07 (×6): qty 3

## 2021-01-07 MED ORDER — ENOXAPARIN SODIUM 40 MG/0.4ML IJ SOSY
40.0000 mg | PREFILLED_SYRINGE | INTRAMUSCULAR | Status: DC
  Administered 2021-01-07 – 2021-01-08 (×2): 40 mg via SUBCUTANEOUS
  Filled 2021-01-07 (×2): qty 0.4

## 2021-01-07 MED ORDER — OXYCODONE HCL 5 MG PO TABS
10.0000 mg | ORAL_TABLET | ORAL | Status: DC | PRN
  Administered 2021-01-07 – 2021-01-09 (×10): 10 mg via ORAL
  Filled 2021-01-07 (×11): qty 2

## 2021-01-07 MED ORDER — KETOROLAC TROMETHAMINE 15 MG/ML IJ SOLN
15.0000 mg | Freq: Four times a day (QID) | INTRAMUSCULAR | Status: DC | PRN
  Administered 2021-01-07 – 2021-01-09 (×7): 15 mg via INTRAVENOUS
  Filled 2021-01-07 (×9): qty 1

## 2021-01-07 MED ORDER — METHOCARBAMOL 500 MG PO TABS
500.0000 mg | ORAL_TABLET | Freq: Three times a day (TID) | ORAL | Status: AC
  Administered 2021-01-07 – 2021-01-08 (×6): 500 mg via ORAL
  Filled 2021-01-07 (×6): qty 1

## 2021-01-07 NOTE — Hospital Course (Signed)
HPI:   Matthew Tapia comes in to discuss sternal pain that started about 3 weeks ago.  He was last seen in March 2022 where he was cleared for cardiac rehab after undergoing coronary artery revascularization.  He has had multiple issues since being discharged from clinic which include narcotics abuse, along with medical noncompliance.  He no longer has a primary care physician and does not receive that his medications or been treating his diabetes.  In May of this year he was admitted to the hospital after sustaining a cerebrovascular event.  He has some residual visual field defects, but no motor defects.   He has been complaining of some clicking sensation and pain along his sternum for the past 3 weeks.  He is referred back to [place] for further evaluation.  Hospital Course:   On 8/15 Mr. Filyaw underwent sternal plating for sternal nonunion with Dr. Cliffton Asters. He tolerated the procedure well and was transferred to [facility] for continued care. He was having trouble with pain control early after surgery so a dilaudid PCA was started. The patient had some shortness of breath overnight so this was changed to his PO Oxycodone that he takes at home and morphine for breakthrough pain. He continued to have difficulty with pain therefore we added Toradol. We continued to encourage ambulation and use of incentive spirometer.

## 2021-01-07 NOTE — Plan of Care (Signed)
Initiate Care Plan Problem: Education: Goal: Knowledge of General Education information will improve Description: Including pain rating scale, medication(s)/side effects and non-pharmacologic comfort measures Outcome: Progressing   Problem: Health Behavior/Discharge Planning: Goal: Ability to manage health-related needs will improve Outcome: Progressing   Problem: Clinical Measurements: Goal: Ability to maintain clinical measurements within normal limits will improve Outcome: Progressing Goal: Will remain free from infection Outcome: Progressing Goal: Diagnostic test results will improve Outcome: Progressing Goal: Respiratory complications will improve Outcome: Progressing Goal: Cardiovascular complication will be avoided Outcome: Progressing   Problem: Activity: Goal: Risk for activity intolerance will decrease Outcome: Progressing   Problem: Nutrition: Goal: Adequate nutrition will be maintained Outcome: Progressing   Problem: Coping: Goal: Level of anxiety will decrease Outcome: Progressing   Problem: Elimination: Goal: Will not experience complications related to bowel motility Outcome: Progressing Goal: Will not experience complications related to urinary retention Outcome: Progressing   Problem: Pain Managment: Goal: General experience of comfort will improve Outcome: Progressing   Problem: Safety: Goal: Ability to remain free from injury will improve Outcome: Progressing   Problem: Skin Integrity: Goal: Risk for impaired skin integrity will decrease Outcome: Progressing   Problem: Education: Goal: Required Educational Video(s) Outcome: Progressing   Problem: Clinical Measurements: Goal: Postoperative complications will be avoided or minimized Outcome: Progressing   Problem: Skin Integrity: Goal: Demonstration of wound healing without infection will improve Outcome: Progressing   

## 2021-01-07 NOTE — Progress Notes (Signed)
      [address]       [name] [address]             [phone]       Contacted via nursing in regards to pain control.  He is sitting up in the chair stating he is experiencing 10/10 chest pain.  He states he has been having pain for the past 10 weeks.  He states he takes percocet at home.  He previously had a pain contract which he no longer has.  He states he saves his pain medication and takes multiple pills at a time when needed, which would be more than he is prescribed.  Patient encouraged to ambulate.  He was previously refusing Toradol that has been ordered and he was encouraged to try this for pain control.   His percocet is being discontinued and he will be give Oxycodone 10 mg tablets every 4 hours as needed for pain.   Lowella Dandy, PA-C

## 2021-01-07 NOTE — Progress Notes (Addendum)
      [address]       [name] [address]             [phone]      1 Day Post-Op Procedure(s) (LRB): STERNAL PLATING (N/A) Subjective: Having pain this morning and requesting more medication. He states the dilaudid last night made him short of breath so this was discontinued  Objective: Vital signs in last 24 hours: Temp:  [97.2 F (36.2 C)-98.5 F (36.9 C)] 98.5 F (36.9 C) (08/16 0300) Pulse Rate:  [69-93] 88 (08/16 0450) Cardiac Rhythm: Normal sinus rhythm (08/15 2339) Resp:  [8-21] 21 (08/16 0450) BP: (101-146)/(60-93) 137/88 (08/16 0300) SpO2:  [91 %-97 %] 91 % (08/16 0450) Weight:  [111.6 kg] 111.6 kg (08/16 0300)    Intake/Output from previous day: 08/15 0701 - 08/16 0700 In: 1101.2 [I.V.:1001.2; IV Piggyback:100] Out: 1000 [Urine:950; Blood:50] Intake/Output this shift: No intake/output data recorded.  General appearance: alert, cooperative, and no distress Heart: regular rate and rhythm, S1, S2 normal, no murmur, click, rub or gallop Lungs: clear to auscultation bilaterally and bilateral wheezing Abdomen: soft, non-tender; bowel sounds normal; no masses,  no organomegaly Extremities: extremities normal, atraumatic, no cyanosis or edema Wound: clean and dry, covered with a sterile dressing  Lab Results: No results for input(s): WBC, HGB, HCT, PLT in the last 72 hours. BMET: No results for input(s): NA, K, CL, CO2, GLUCOSE, BUN, CREATININE, CALCIUM in the last 72 hours.  PT/INR: No results for input(s): LABPROT, INR in the last 72 hours. ABG    Component Value Date/Time   PHART 7.361 01/02/2021 1406   HCO3 22.2 01/02/2021 1406   TCO2 17 (L) 05/21/2020 1727   ACIDBASEDEF 2.4 (H) 01/02/2021 1406   O2SAT 97.2 01/02/2021 1406   CBG (last 3)  No results for input(s): GLUCAP in the last 72 hours.  Assessment/Plan: S/P Procedure(s) (LRB): STERNAL PLATING (N/A)  CV-NSR in the 80s, BP well controlled Pulm-tolerating room air. No chest  tubes in place. + wheezing on exam, xopenex ordered. Renal-creatinine 0.83, electrolytes okay Chronic pain-he is on chronic narcotics at home for back pain. Did not do well with dilaudid so changed to morphine with his home dose of oxycodone. Will add Toradol.  H and H stable, 14.7/45.4  Plan: Working on pain control. Encouraged to ambulate around the unit. Xopenex for wheezing.    LOS: 1 day    Sharlene Dory 01/07/2021  Agree with above. Adjusting pain control. Plan for discharge tomorrow.  Nolie Bignell Keane Scrape

## 2021-01-07 NOTE — Progress Notes (Deleted)
Cardiology Office Note:    Date:  01/07/2021   ID:  Matthew Tapia, DOB [birth year], MRN [id number]  PCP:  Miki Kins, FNP  [facility] Cardiologist:  Lorine Bears, MD  [facility] Electrophysiologist:  None   Referring MD: Miki Kins, FNP   Chief Complaint: pre-op cardiac exam.   History of Present Illness:    Matthew Tapia is a 55 y.o. male with a hx of CAD status post CABG and stenting, substance abuse including cocaine and amphetamine, anxiety, chronic pain, hypertension, hyperlipidemia, tobacco use, stroke May 2022.  In December 2021 patient was admitted with STEMI.  Urgent cath showed multivessel disease and thrombus in the RCA. This was cleared with aspiration thrombectomy.  He was started on Aggrastat and Brilinta.  Echo showed EF 35 to 40%, grade 1 diastolic dysfunction, severe hypokinesis of the left ventricular wall.  He was taken back to the Cath Lab 05/16/2020 which showed restoration of flow to the RCA no further intervention was required.  Patient was eventually transferred to [facility] for CABG and this was scheduled for 1/28. However on 05/20/2020 the patient developed worsening chest pain with ST changes on EKG.  Cath showed occluded occlusion of RCA and he this was treated with PTCA and balloon angioplasty with restoration of flow.  He was taken to the operating room 05/21/2020 and underwent CABG x4 with LIMA to the LAD, SVG to OM1, SVG to OM2, SVG to PDA.   He was hospitalized in May with mental status changes.  MRI showed patchy multifocal acute ischemic infarcts.  He was dehydrated improved with IV fluids.  Urine tox screen was negative. Echo bubble study during that time showed LVEF 40 to 45%.  Went to the ER 11/23/2020 for chest pain.Troponin was negative. Work-up for ACS was negative.   Patient was seen 11/28/2020 by  Dr. Kirke Corin for ER follow-up.  It was felt his chest pain was musculoskeletal and it was recommended  he see CT surgeon. Medical  management was continued.   Saw Dr. 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 Plan for sternal plating. He wants diabetes to be more under control before surgery, however patient does not have PCP given lack of insurance. Hgb A1C 6.26 Sep 2020   Seen 8/3/2 in the ED for edema and shortness of breath. CXR showed clear lungs. BNP 236. Spoke with cardiothoracic sugeon who did not recommend intervention. They gave him lasix 20mg  daily. Called in prescription at [facility] and he was unable to pick it up with lack of incurance. Medicare should start September    Seen 12/27/20 and reported breathing was better.   Today,     Past Medical History:  Diagnosis Date   Anxiety    Asthma    CAD, multiple vessel    CHF (congestive heart failure) (HCC)    Dyslipidemia, goal LDL below 70    Former smoker    Quit 2021   HFrEF (heart failure with reduced ejection fraction) (HCC)    Hypertension    Polysubstance abuse (HCC)    UDS positive for cocaine, amphetamines, benzos 05/15/20   ST elevation myocardial infarction (STEMI) of inferior wall (HCC)    Stroke (HCC) 10/03/2020    Past Surgical History:  Procedure Laterality Date   CARDIAC CATHETERIZATION     CORONARY ANGIOGRAPHY N/A 05/20/2020   Procedure: CORONARY ANGIOGRAPHY;  Surgeon: 05/22/2020,  Nile Dear, MD;  Location: [facility] INVASIVE CV LAB;  Service: Cardiovascular;  Laterality: N/A;   CORONARY ARTERY BYPASS GRAFT N/A 05/21/2020   Procedure: CORONARY ARTERY BYPASS GRAFTING TIMES FOUR USING LEFT INTERNAL MAMMARY ARTERY  AND ENDOSCOPICALLY HARVESTED RIGHT GREATER SAPHANOUS VEIN.;  Surgeon: Corliss Skains, MD;  Location: [facility] OR;  Service: Open Heart Surgery;  Laterality: N/A;  flow track   CORONARY THROMBECTOMY N/A 05/20/2020   Procedure: Coronary Thrombectomy;  Surgeon: Kathleene Hazel, MD;  Location: [facility];  Service:  Cardiovascular;  Laterality: N/A;   CORONARY/GRAFT ACUTE MI REVASCULARIZATION N/A 05/14/2020   Procedure: Coronary/Graft Acute MI Revascularization;  Surgeon: Iran Ouch, MD;  Location: [facility] INVASIVE CV LAB;  Service: Cardiovascular;  Laterality: N/A;   CORONARY/GRAFT ACUTE MI REVASCULARIZATION N/A 05/20/2020   Procedure: Coronary/Graft Acute MI Revascularization;  Surgeon: Kathleene Hazel, MD;  Location: [facility];  Service: Cardiovascular;  Laterality: N/A;   ENDOVEIN HARVEST OF GREATER SAPHENOUS VEIN Right 05/21/2020   Procedure: ENDOVEIN HARVEST OF RIGHT GREATER SAPHENOUS VEIN;  Surgeon: Corliss Skains, MD;  Location: [facility] OR;  Service: Open Heart Surgery;  Laterality: Right;   INTRAVASCULAR PRESSURE WIRE/FFR STUDY N/A 05/16/2020   Procedure: INTRAVASCULAR PRESSURE WIRE/FFR STUDY;  Surgeon: Iran Ouch, MD;  Location: [facility] INVASIVE CV LAB;  Service: Cardiovascular;  Laterality: N/A;   LEFT HEART CATH AND CORONARY ANGIOGRAPHY N/A 05/14/2020   Procedure: LEFT HEART CATH AND CORONARY ANGIOGRAPHY;  Surgeon: Iran Ouch, MD;  Location: [facility] INVASIVE CV LAB;  Service: Cardiovascular;  Laterality: N/A;   LEFT HEART CATH AND CORONARY ANGIOGRAPHY N/A 05/16/2020   Procedure: LEFT HEART CATH AND CORONARY ANGIOGRAPHY;  Surgeon: Iran Ouch, MD;  Location: [facility] INVASIVE CV LAB;  Service: Cardiovascular;  Laterality: N/A;   RIB PLATING N/A 01/06/2021   Procedure: STERNAL PLATING;  Surgeon: Corliss Skains, MD;  Location: [facility] OR;  Service: Thoracic;  Laterality: N/A;   SHOULDER SURGERY     TEE WITHOUT CARDIOVERSION N/A 05/21/2020   Procedure: TRANSESOPHAGEAL ECHOCARDIOGRAM (TEE);  Surgeon: Corliss Skains, MD;  Location: [facility] OR;  Service: Open Heart Surgery;  Laterality: N/A;    Current Medications: No outpatient medications have been marked as taking for the 01/08/21 encounter (Appointment) with Fransico Michael, Draylen Lobue H, PA-C.     Allergies:   Patient has no  known allergies.   Social History   Socioeconomic History   Marital status: Married    Spouse name: Not on file   Number of children: Not on file   Years of education: Not on file   Highest education level: Not on file  Occupational History   Not on file  Tobacco Use   Smoking status: Every Day    Packs/day: 0.25    Years: 42.00    Pack years: 10.50    Types: Cigarettes   Smokeless tobacco: Never   Tobacco comments:    1-2 cigarettes "every now and then"  Vaping Use   Vaping Use: Never used  Substance and Sexual Activity   Alcohol use: No   Drug use: Never   Sexual activity: Not Currently  Other Topics Concern   Not on file  Social History Narrative   Not on file   Social Determinants of Health   Financial Resource Strain: High Risk   Difficulty of Paying Living Expenses: Hard  Food Insecurity: No Food Insecurity   Worried About Running Out of Food in the Last Year: Never true   Ran  Out of Food in the Last Year: Never true  Transportation Needs: Unmet Transportation Needs   Lack of Transportation (Medical): Yes   Lack of Transportation (Non-Medical): Yes  Physical Activity: Not on file  Stress: Stress Concern Present   Feeling of Stress : To some extent  Social Connections: Not on file     Family History: The patient's ***family history includes Heart attack in his father.  ROS:   Please see the history of present illness.    *** All other systems reviewed and are negative.  EKGs/Labs/Other Studies Reviewed:    The following studies were reviewed today: ***  EKG:  EKG is *** ordered today.  The ekg ordered today demonstrates ***  Recent Labs: 10/03/2020: TSH 0.791 10/04/2020: Magnesium 2.2 12/27/2020: B Natriuretic Peptide 134.7 01/02/2021: ALT 23; BUN 11; Potassium 4.4; Sodium 136 01/07/2021: Creatinine, Ser 0.79; Hemoglobin 13.7; Platelets 228  Recent Lipid Panel    Component Value Date/Time   CHOL 168 12/27/2020 1531   TRIG 309 (H) 12/27/2020 1531    HDL 37 (L) 12/27/2020 1531   CHOLHDL 4.5 12/27/2020 1531   VLDL 62 (H) 12/27/2020 1531   LDLCALC 69 12/27/2020 1531   LDLDIRECT 139.3 (H) 10/04/2020 1131     Risk Assessment/Calculations:   {Does this patient have ATRIAL FIBRILLATION?:[id number]}   Physical Exam:    VS:  There were no vitals taken for this visit.    Wt Readings from Last 3 Encounters:  01/07/21 246 lb 0.5 oz (111.6 kg)  01/02/21 240 lb 4.8 oz (109 kg)  12/27/20 243 lb 8 oz (110.5 kg)     GEN: *** Well nourished, well developed in no acute distress HEENT: Normal NECK: No JVD; No carotid bruits LYMPHATICS: No lymphadenopathy CARDIAC: ***RRR, no murmurs, rubs, gallops RESPIRATORY:  Clear to auscultation without rales, wheezing or rhonchi  ABDOMEN: Soft, non-tender, non-distended MUSCULOSKELETAL:  No edema; No deformity  SKIN: Warm and dry NEUROLOGIC:  Alert and oriented x 3 PSYCHIATRIC:  Normal affect   ASSESSMENT:    No diagnosis found. PLAN:    In order of problems listed above:  CAD s/p CABG 07/2020 Nonunion of sternum Chest pain  HFrEF ICM  HLD  hyperglycemia  Disposition: Follow up {follow up:15908} with ***   Shared Decision Making/Informed Consent   {Are you ordering a CV Procedure (e.g. stress test, cath, DCCV, TEE, etc)?   Press F2        :[id number]}    Signed, Richerd Grime Ardelle Lesches  01/07/2021 8:37 PM    [facility]

## 2021-01-07 NOTE — Progress Notes (Signed)
Spoke with Lowella Dandy about increase in drainage on patients chest dressing. Drainage is not profuse. Dressing marked. PA to follow up in the AM.

## 2021-01-08 ENCOUNTER — Ambulatory Visit: Payer: Self-pay | Admitting: Medical

## 2021-01-08 MED ORDER — LOSARTAN POTASSIUM 25 MG PO TABS
25.0000 mg | ORAL_TABLET | Freq: Every day | ORAL | Status: DC
  Administered 2021-01-08 – 2021-01-09 (×2): 25 mg via ORAL
  Filled 2021-01-08 (×2): qty 1

## 2021-01-08 MED ORDER — ATORVASTATIN CALCIUM 80 MG PO TABS
80.0000 mg | ORAL_TABLET | Freq: Every day | ORAL | Status: DC
  Administered 2021-01-08 – 2021-01-09 (×2): 80 mg via ORAL
  Filled 2021-01-08 (×2): qty 1

## 2021-01-08 MED ORDER — PIPERACILLIN-TAZOBACTAM 3.375 G IVPB
3.3750 g | Freq: Three times a day (TID) | INTRAVENOUS | Status: DC
  Administered 2021-01-08 – 2021-01-09 (×3): 3.375 g via INTRAVENOUS
  Filled 2021-01-08 (×5): qty 50

## 2021-01-08 MED ORDER — VANCOMYCIN HCL 2000 MG/400ML IV SOLN
2000.0000 mg | Freq: Once | INTRAVENOUS | Status: AC
  Administered 2021-01-08: 2000 mg via INTRAVENOUS
  Filled 2021-01-08: qty 400

## 2021-01-08 MED ORDER — FUROSEMIDE 40 MG PO TABS
40.0000 mg | ORAL_TABLET | Freq: Once | ORAL | Status: AC
  Administered 2021-01-08: 40 mg via ORAL
  Filled 2021-01-08: qty 1

## 2021-01-08 MED ORDER — VANCOMYCIN HCL 1250 MG/250ML IV SOLN
1250.0000 mg | Freq: Two times a day (BID) | INTRAVENOUS | Status: DC
  Administered 2021-01-08: 1250 mg via INTRAVENOUS
  Filled 2021-01-08 (×2): qty 250

## 2021-01-08 MED ORDER — POTASSIUM CHLORIDE CRYS ER 10 MEQ PO TBCR
10.0000 meq | EXTENDED_RELEASE_TABLET | Freq: Once | ORAL | Status: DC
  Filled 2021-01-08 (×2): qty 1

## 2021-01-08 MED ORDER — CARVEDILOL 6.25 MG PO TABS
6.2500 mg | ORAL_TABLET | Freq: Two times a day (BID) | ORAL | Status: DC
  Administered 2021-01-08 – 2021-01-09 (×2): 6.25 mg via ORAL
  Filled 2021-01-08 (×3): qty 1

## 2021-01-08 MED ORDER — GUAIFENESIN 100 MG/5ML PO SOLN
10.0000 mL | Freq: Four times a day (QID) | ORAL | Status: DC | PRN
  Administered 2021-01-08: 200 mg via ORAL
  Filled 2021-01-08: qty 10

## 2021-01-08 NOTE — Progress Notes (Signed)
Patient states he needs "a fluid pill". Per nurse, patient states someone who saw him this am said it would be taken card of. I did not see an order for Lasix. After reviewing his chart and checking creatinine, I gave orders for a one time dose of Lasix and potassium supplement. We will re evaluate fluid status in am.

## 2021-01-08 NOTE — Progress Notes (Signed)
Pharmacy Antibiotic Note  Matthew Tapia is a 56 y.o. male admitted on 01/06/2021 with  concern for sternal wound .  Pharmacy has been consulted for vancomycin and zosyn dosing.  WBC 19.4, afebrile. S/p sternal plating for 8/15. Had large amount of drainage (cloudy maroon drainage) accumulated under dressing. Scr 0.79 (CrCl >100 mL/min).   Plan: Vancomycin 2g IV once then 1250 mg IV every 12 hours (estAUC 548) Zosyn 3.375g IV every 8 hours Monitor renal fx, cx results, clinical pic - could consider doing cefepime/metronidazole over zosyn if want to avoid AKI risk with concurrent vancomycin   Height: 5\' 6"  (167.6 cm) Weight: 111.6 kg (246 lb 0.5 oz) IBW/kg (Calculated) : 63.8  Temp (24hrs), Avg:98.7 F (37.1 C), Min:98.4 F (36.9 C), Max:98.8 F (37.1 C)  Recent Labs  Lab 01/02/21 1430 01/07/21 1006  WBC 8.6 19.4*  CREATININE 0.83 0.79    Estimated Creatinine Clearance: 120.9 mL/min (by C-G formula based on SCr of 0.79 mg/dL).    No Known Allergies  Antimicrobials this admission: Cefazolin 8/15 (peri-op) Vancomycin 8/17 >>  Zosyn 8/17 >>   Dose adjustments this admission: N/A  Microbiology results: None  Thank you for allowing pharmacy to be a part of this patient's care.  9/17, PharmD, BCCCP Clinical Pharmacist  Phone: [phone] 01/08/2021 10:44 AM  Please check AMION for all [facility] phone numbers After 10:00 PM, call Main Pharmacy [phone]

## 2021-01-08 NOTE — Progress Notes (Signed)
Pt chest dressing leaking from bottom what appeared to be old blood- reinforced bandage and will continue to monitor pt

## 2021-01-08 NOTE — Progress Notes (Signed)
Flutter valve given at this time. Patient currently on Rm air sat 95% with no labored breathing noted. Bilateral exp.wheeze noted.

## 2021-01-08 NOTE — Plan of Care (Signed)
  Problem: Education: Goal: Knowledge of General Education information will improve Description: Including pain rating scale, medication(s)/side effects and non-pharmacologic comfort measures Outcome: Progressing   Problem: Health Behavior/Discharge Planning: Goal: Ability to manage health-related needs will improve Outcome: Progressing   Problem: Clinical Measurements: Goal: Will remain free from infection Outcome: Progressing Goal: Respiratory complications will improve Outcome: Progressing Goal: Cardiovascular complication will be avoided Outcome: Progressing   Problem: Coping: Goal: Level of anxiety will decrease Outcome: Progressing   Problem: Pain Managment: Goal: General experience of comfort will improve Outcome: Progressing   Problem: Skin Integrity: Goal: Risk for impaired skin integrity will decrease Outcome: Progressing

## 2021-01-08 NOTE — Progress Notes (Addendum)
      [address]       [name] [address]             [phone]      2 Days Post-Op Procedure(s) (LRB): STERNAL PLATING (N/A) Subjective: Up in the bedside chair, says he is more comfortable today. Had an episode of sneezing last night followed by accumulation of a large amount of drainage under the Aquacel dressing.   Denies any sensation of motion in his sternum.  Objective: Vital signs in last 24 hours: Temp:  [98.4 F (36.9 C)-98.8 F (37.1 C)] 98.7 F (37.1 C) (08/17 0715) Pulse Rate:  [78-93] 84 (08/17 0715) Cardiac Rhythm: Normal sinus rhythm (08/17 0716) Resp:  [15-19] 15 (08/17 0715) BP: (130-142)/(73-98) 142/98 (08/17 0715) SpO2:  [94 %-99 %] 94 % (08/17 0830)    Intake/Output from previous day: 08/16 0701 - 08/17 0700 In: -  Out: 1275 [Urine:1275] Intake/Output this shift: No intake/output data recorded.  General appearance: alert, cooperative, and mild distress Neurologic: intact Heart: regular rate and rhythm Lungs: breath sounds are clear, O2 sats OK on RA.  Abdomen: firm but non-tender.  Extremities: mild LE edema Wound: Aquacel dressing removed from the sternal incision. 10-36ml thick maroon fluid was pooled beneath the dressing. This was cleaned up and the wound was observed for a few minutes. There was further, thin cloudy fluid that continued to drain slowly from the lower end of the incision. A new sterile ABD dressing was applied.   Lab Results: Recent Labs    01/07/21 1006  WBC 19.4*  HGB 13.7  HCT 42.3  PLT 228   BMET:  Recent Labs    01/07/21 1006  CREATININE 0.79    PT/INR: No results for input(s): LABPROT, INR in the last 72 hours. ABG    Component Value Date/Time   PHART 7.361 01/02/2021 1406   HCO3 22.2 01/02/2021 1406   TCO2 17 (L) 05/21/2020 1727   ACIDBASEDEF 2.4 (H) 01/02/2021 1406   O2SAT 97.2 01/02/2021 1406   CBG (last 3)  No results for input(s): GLUCAP in the last 72  hours.  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 He is more comfortable today. Concerned by leukocytosis and significant cloudy maroon drainage from the sternum overnight. Will start empiric ABX, apply a Prevena negative pressure dressing, and repeat the WBC in AM.  Will need to keep him in the hospital for now for observation.   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  Will resume his carvedilol, losartan, and Lasix.   -DVT PPX- continue daily [place] enoxaparin.       LOS: 2 days    Leary Roca, PA-C [phone] 01/08/2021

## 2021-01-09 ENCOUNTER — Other Ambulatory Visit: Payer: Self-pay

## 2021-01-09 ENCOUNTER — Telehealth: Payer: Self-pay | Admitting: Licensed Clinical Social Worker

## 2021-01-09 LAB — BASIC METABOLIC PANEL
Anion gap: 7 (ref 5–15)
BUN: 14 mg/dL (ref 6–20)
CO2: 26 mmol/L (ref 22–32)
Calcium: 8.7 mg/dL — ABNORMAL LOW (ref 8.9–10.3)
Chloride: 103 mmol/L (ref 98–111)
Creatinine, Ser: 0.87 mg/dL (ref 0.61–1.24)
GFR, Estimated: >60 mL/min (ref >60)
Glucose, Bld: 103 mg/dL — ABNORMAL HIGH (ref 70–99)
Potassium: 5 mmol/L (ref 3.5–5.1)
Sodium: 136 mmol/L (ref 135–145)

## 2021-01-09 LAB — CBC
HCT: 41 % (ref 39.0–52.0)
Hemoglobin: 13.6 g/dL (ref 13.0–17.0)
MCH: 32.5 pg (ref 26.0–34.0)
MCHC: 33.2 g/dL (ref 30.0–36.0)
MCV: 97.9 fL (ref 80.0–100.0)
Platelets: 219 10*3/uL (ref 150–400)
RBC: 4.19 MIL/uL — ABNORMAL LOW (ref 4.22–5.81)
RDW: 14.1 % (ref 11.5–15.5)
WBC: 12.5 10*3/uL — ABNORMAL HIGH (ref 4.0–10.5)
nRBC: 0 % (ref 0.0–0.2)

## 2021-01-09 MED ORDER — CEPHALEXIN 500 MG PO CAPS: 500.0000 mg | ORAL_CAPSULE | Freq: Four times a day (QID) | ORAL | 0 refills | Status: DC

## 2021-01-09 MED ORDER — BENZONATATE 100 MG PO CAPS
200.0000 mg | ORAL_CAPSULE | Freq: Three times a day (TID) | ORAL | Status: DC | PRN
  Administered 2021-01-09 (×2): 200 mg via ORAL
  Filled 2021-01-09 (×2): qty 2

## 2021-01-09 MED ORDER — OXYCODONE HCL 10 MG PO TABS: 10.0000 mg | ORAL_TABLET | Freq: Four times a day (QID) | ORAL | 0 refills | Status: DC | PRN

## 2021-01-09 NOTE — Progress Notes (Signed)
Pt discharged per MD order. IV and tele removed. Pt educated on discharge instructions. Questions answered to satisfaction. Pt escorted to private vehicle.

## 2021-01-09 NOTE — Discharge Summary (Signed)
Physician Discharge Summary  Patient ID: [name] MRN: [id number] DOB/AGE: [birth year] 55 y.o.  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 comes in to discuss sternal pain that started about 3 weeks ago.  He was last seen in March 2022 where he was cleared for cardiac rehab after undergoing coronary artery revascularization.  He has had multiple issues since being discharged from clinic which include narcotics abuse, along with medical noncompliance.  He no longer has a primary care physician and does not receive that his medications or been treating his diabetes.  In May of this year he was admitted to the hospital after sustaining a cerebrovascular event.  He has some residual visual field defects, but no motor defects.   He has been complaining of some clicking sensation and pain along his sternum for the past 3 weeks.  He is referred back to [place] for further evaluation.  CT scan was reviewed.  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     On review of his labs from May his hemoglobin A1c is also worsened.   Dr. Cliffton Asters discussed several options for treatment of his  sternal nonunion and ultimately decision was made to proceed with sternal plating.    Hospital Course: [name] was admitted for elective surgery on 01/06/2021 and taken to the operative room where KLS plating of the inferior portion of the sternum was carried out.  Please see operative note for details.  Following the procedure, patient was recovered in the postanesthesia care unit and later transferred to progressive care.  Pain control was achieved with oral and IV medications initially.  On the night of the first postoperative day, the head and episode of coughing resulting in accumulation of a large amount of maroon fluid underneath the Aquacel dressing.  The dressing was reinforced then assessed the following morning.  His white blood count on that day was noted to be 19,000.  On postop day 2, the wound was cleaned up and assessed.  There was a small amount of serosanguineous drainage coming from the lower end of the incision.  A Prevena dressing was placed to site.  This was observed over the next 24 hours and proved to have less than 5 cc of drainage in the Praveena canister.  White blood count decreased to 12,000 but postop day 3.  Mr. Montez Morita reported that he felt well and having minimal soreness in his sternum compared to the pain he was having prior to surgery.  Arrangements were made for discharge to home with a Prevena dressing in place with plans to follow-up in the office for reassessment and removal of the device.  We will also be discharged on oral Keflex 500 mg p.o. 4 times daily for 7 days.  Careful instructions were given regarding wound care and the importance of  observing sternal precautions.  Consults: None  Significant Diagnostic Studies:   CLINICAL DATA:  Sternal pain.  Status post coronary bypass graft.   EXAM: CT CHEST WITHOUT CONTRAST   TECHNIQUE: Multidetector CT imaging of the chest was performed following the standard protocol without IV contrast.   COMPARISON:   None.   FINDINGS: Cardiovascular: Status post coronary artery bypass graft. No evidence of thoracic aortic aneurysm. Normal cardiac size. No pericardial effusion.   Mediastinum/Nodes: No enlarged mediastinal or axillary lymph nodes. Thyroid gland, trachea, and esophagus demonstrate no significant findings.   Lungs/Pleura: Lungs are clear. No pleural effusion or pneumothorax.   Upper Abdomen: Hepatic steatosis.  Mild cholelithiasis.   Musculoskeletal: No chest wall mass or suspicious bone lesions identified.   IMPRESSION: No acute abnormality is noted in the chest.   Hepatic steatosis.   Mild cholelithiasis.     Electronically Signed   By: Lupita Raider M.D.   On: 12/17/2020 15:01  Treatments:   OP NOTE 01/06/2021 Patient:  Matthew Tapia Pre-Op Dx: Sternal nonunion                         History of CABG Post-op Dx: Same Procedure: KLS plating of the inferior portion of the sternum.  4 screws were used.   Surgeon and Role:      * Corliss Skains, MD - Primary   Anesthesia  general EBL: 50 ml Blood Administration: None     Counts: correct     Indications: 55 year old male status post CABG in March 2022.  He also has a history of substance abuse and went back to active using shortly following his discharge from the hospital.  Over the last several weeks he he has had several coughing and sneezing spells, and is noted increased pain at the lower portion of the sternum.  There is also been some clicking as well.  Cross-sectional imaging did reveal nonunion of the sternum at the inferior border.  He was brought back to the operating theater for plating and rewiring the lower portion of the sternum.   Findings: The upper portion of the sternum along with the manubrium are well-healed.  There was some cartilaginous healing in between the lower portion of the sternum all wires were removed, and the inferior portion of the sternum was mobilized off of the right  heart.  KLS plating was secured in the lower portion was rewired.   Operative Technique: The patient was brought to the operative theatre.  Anesthesia was induced, and the patient was prepped and draped in normal sterile fashion.  An appropriate surgical pause was performed, and pre-operative antibiotics were dosed accordingly.   The previous sternotomy incision was re-opened, and all the wires were removed.  The top portion of the sternum was well-healed.  There was a cartilaginous bridge in between the lower portion of the sternum edges.  This was divided with Bovie cautery and the sternal edges were elevated off of the anterior surface of the right heart.  Once we had achieved adequate mobilization KLS plates were screwed in to each sternal edge, and double wires were used to reapproximate them.   Skin and soft tissue were closed with several layers of absorbable suture.  The patient tolerated the procedure without any immediate complications and was extubated and transferred to the PACU.     Corliss Skains  Discharge Exam: Blood pressure (!) 149/87, pulse 70, temperature 98.7 F (37.1 C), temperature  source Oral, resp. rate 15, height 5\' 6"  (1.676 m), weight 113.3 kg, SpO2 100 %.  General appearance: alert, cooperative, and mild distress Neurologic: intact Heart: regular rate and rhythm Lungs: breath sounds are clear, O2 sats OK on RA.  Abdomen: soft, non-tender.  Extremities: mild LE edema Wound: Prevena dressing in place, scant drainage since placed yesterday.   Disposition:  Discharged home in stable condition  Allergies as of 01/09/2021   No Known Allergies      Medication List     STOP taking these medications    oxyCODONE-acetaminophen 5-325 MG tablet Commonly known as: PERCOCET/ROXICET       TAKE these medications    Aspirin Adult Low Strength 81 MG EC tablet Generic drug: aspirin Take 1 tablet (81 mg total) by once mouth daily. Swallow whole.    atorvastatin 80 MG tablet Commonly known as: LIPITOR Take 1 tablet (80 mg total) by mouth once daily.   carvedilol 6.25 MG tablet Commonly known as: COREG Take 1 tablet (6.25 mg total) by mouth 2 (two) times daily.   cephALEXin 500 MG capsule Commonly known as: KEFLEX Take 1 capsule (500 mg total) by mouth 4 (four) times daily for 7 days.   furosemide 20 MG tablet Commonly known as: LASIX Take 1 tablet (20 mg total) by mouth once daily.   losartan 25 MG tablet Commonly known as: COZAAR Take 1 tablet (25 mg total) by mouth once daily.   Oxycodone HCl 10 MG Tabs Take 1 tablet (10 mg total) by mouth every 6 (six) hours as needed for up to 5 days for moderate pain.        Follow-up Information  [facility]  Cardiothoracic Surgery [phone] [address] [place], [address] [place]     Next Steps: Go on 01/13/2021 Appointment:  Instructions: Your appointment is at 2:30pm.                Signed: 01/15/2021, PA-C 01/09/2021, 8:32 AM

## 2021-01-09 NOTE — Plan of Care (Signed)
  Problem: Education: Goal: Knowledge of General Education information will improve Description Including pain rating scale, medication(s)/side effects and non-pharmacologic comfort measures Outcome: Progressing   

## 2021-01-09 NOTE — Discharge Instructions (Signed)
Discharge Instructions:  1. You may shower, please wash incisions daily with soap and water and keep dry.  If you wish to cover wounds with dressing you may do so but please keep clean and change daily.  No tub baths or swimming until incisions have completely healed.  If your incisions become red or develop any drainage please call our office at [phone]  2. No Driving until cleared by [facility] office and you are no longer using narcotic pain medications  3. Fever of 101.5 for at least 24 hours with no source, please contact our office at [phone]  4 Activity- up as tolerated, please walk at least 3 times per day.  Avoid strenuous activity, no lifting, pushing, or pulling with your arms over 8-10 lbs for a minimum of 6 weeks  5. If any questions or concerns arise, please do not hesitate to contact our office at [phone]   6. Keep Prevena negative pressure dressing in place. Will plan to remove in the office on Monday.

## 2021-01-09 NOTE — Plan of Care (Signed)
  Problem: Education: Goal: Knowledge of General Education information will improve Description: Including pain rating scale, medication(s)/side effects and non-pharmacologic comfort measures 01/09/2021 1357 by Roselyn Bering, RN Outcome: Adequate for Discharge 01/09/2021 0809 by Roselyn Bering, RN Outcome: Progressing

## 2021-01-09 NOTE — Progress Notes (Signed)
      [address]       [place] [address]             [phone]      3 Days Post-Op Procedure(s) (LRB): STERNAL PLATING (N/A) Subjective: Up in the bedside chair says he is having much less sternal pain since having his sternum repaired. Rested well, no new concerns.   Minimal drainage from the Prevena negative pressure dressing.   Objective: Vital signs in last 24 hours: Temp:  [98 F (36.7 C)-98.9 F (37.2 C)] 98.5 F (36.9 C) (08/18 0302) Pulse Rate:  [70-85] 70 (08/18 0302) Cardiac Rhythm: Normal sinus rhythm (08/18 0717) Resp:  [13-20] 16 (08/18 0302) BP: (115-126)/(78-89) 116/82 (08/18 0302) SpO2:  [93 %-98 %] 96 % (08/18 0302) Weight:  [113.3 kg] 113.3 kg (08/18 0302)    Intake/Output from previous day: 08/17 0701 - 08/18 0700 In: 720 [P.O.:720] Out: 2700 [Urine:2700] Intake/Output this shift: No intake/output data recorded.  General appearance: alert, cooperative, and mild distress Neurologic: intact Heart: regular rate and rhythm Lungs: breath sounds are clear, O2 sats OK on RA.  Abdomen: soft, non-tender.  Extremities: mild LE edema Wound: Prevena dressing in place, scant drainage since placed yesterday.   Lab Results: Recent Labs    01/07/21 1006 01/09/21 0208  WBC 19.4* 12.5*  HGB 13.7 13.6  HCT 42.3 41.0  PLT 228 219    BMET:  Recent Labs    01/07/21 1006 01/09/21 0208  NA  --  136  K  --  5.0  CL  --  103  CO2  --  26  GLUCOSE  --  103*  BUN  --  14  CREATININE 0.79 0.87  CALCIUM  --  8.7*     PT/INR: No results for input(s): LABPROT, INR in the last 72 hours. ABG    Component Value Date/Time   PHART 7.361 01/02/2021 1406   HCO3 22.2 01/02/2021 1406   TCO2 17 (L) 05/21/2020 1727   ACIDBASEDEF 2.4 (H) 01/02/2021 1406   O2SAT 97.2 01/02/2021 1406   CBG (last 3)  No results for input(s): GLUCAP in the last 72 hours.  Assessment/Plan: S/P Procedure(s) (LRB): STERNAL PLATING (N/A)  --POD-3 sternal  plating for sternal non-union s/p CABG in March of this year. He is more comfortable since having the sternal repair. Prevena negative pressure dressing placed yesterday for copious drainage the night before. Only trace drainage since placed. WBC down to 12,000, no fever.   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  Restarted his carvedilol, losartan, and Lasix.   -DVT PPX- on daily [place] enoxaparin.   -Disposition- plan discharge to home today with the Prevena in place and plan to see in the office on Monday. To resume all previous medications and will add oral Keflex for 1 week.    LOS: 3 days    Leary Roca, PA-C [phone] 01/09/2021

## 2021-01-10 NOTE — Telephone Encounter (Signed)
LCSW received call from pt on 8/18 to let me know that he has been discharged and reached home. He shares that he is pleased that his chest pain is much improved since the plating and he only currently is having some pain from surgery but that "it manageable." While in the hospital he received an envelope from [facility] but he's not sure what it means. I requested he send a picture of it if agreeable to work phone.   He did show and with the assistance of [facility] billing, were able to determine that pt has been approved for [facility] only. I shared this with pt and he shares that he was made aware when he went to [place] this morning. I encouraged him to reach out to [facility] caseworker to determine if he still has a pending Medicaid application for full Medicaid. If so, they may back pay; if for some reason he has been denied for full Medicaid then I requested he let me know so that we can work on assistance paperwork for [employer]. I will f/u with pt next week.   Matthew Tapia, MSW, LCSW [facility] Heart/Vascular Care Navigation  [phone]

## 2021-01-13 ENCOUNTER — Other Ambulatory Visit: Payer: Self-pay

## 2021-01-13 ENCOUNTER — Ambulatory Visit (INDEPENDENT_AMBULATORY_CARE_PROVIDER_SITE_OTHER): Payer: Self-pay | Admitting: Physician Assistant

## 2021-01-13 ENCOUNTER — Encounter: Payer: Self-pay | Admitting: Physician Assistant

## 2021-01-13 VITALS — BP 140/83 | HR 78 | Resp 20 | Ht 66.0 in | Wt 246.0 lb

## 2021-01-13 DIAGNOSIS — Z4889 Encounter for other specified surgical aftercare: Secondary | ICD-10-CM

## 2021-01-13 MED ORDER — OXYCODONE HCL 10 MG PO TABS: 10.0000 mg | ORAL_TABLET | Freq: Four times a day (QID) | ORAL | 0 refills | Status: DC | PRN

## 2021-01-13 NOTE — Progress Notes (Signed)
[address]       [name] [address]             [phone]       HPI: [name] is a 55 year old male with past history of coronary artery disease status post coronary bypass grafting in March 2022.  His initial postoperative course was uneventful but several months later he had several episodes of heavy coughing and sneezing and subsequently developed increased pain in the lower portion of the sternum.  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  He underwent KLS plating of the inferior portion of the sternum by Dr. Cliffton Asters on 01/06/2021 and tolerated this procedure well.  On the day after surgery, he had a coughing episode while in the hospital saturating his chest dressing with mucoid drainage.  He was started on IV Zosyn in vancomycin empirically.  A Prevena negative pressure dressing was placed over the incision.  He was observed in the hospital for another 24 hours and had no further drainage from the sternal incision.  He was discharged home on 01/09/2021 with a Prevena dressing in place and on oral Keflex. He returns today for scheduled follow-up.  Since hospital discharge the patient reports the Praveena apparatus stopped working 2 days ago so he removed it.  Since then, he has been doing dressing changes daily.  The dressing removed in the office today had been placed 3 hours before and  had just very minor staining on it.  He denies any fever or movement in his breastbone.  Overall, feels much better than prior to surgery.  He has been taking oral Keflex as prescribed.   Current Outpatient Medications  Medication Sig Dispense Refill   aspirin EC 81 MG tablet Take 1 tablet (81 mg total) by once mouth daily. Swallow whole. 90 tablet 1   atorvastatin (LIPITOR) 80 MG tablet Take 1 tablet (80 mg total) by mouth once daily. 90 tablet 1   carvedilol (COREG) 6.25 MG tablet Take 1 tablet (6.25 mg total) by mouth 2 (two) times daily.  180 tablet 1   cephALEXin (KEFLEX) 500 MG capsule Take 1 capsule (500 mg total) by mouth 4 (four) times daily for 7 days. 28 capsule 0   furosemide (LASIX) 20 MG tablet Take 1 tablet (20 mg total) by mouth once daily. 180 tablet 1   losartan (COZAAR) 25 MG tablet Take 1 tablet (25 mg total) by mouth once daily. 90 tablet 1   oxyCODONE 10 MG TABS Take 1 tablet (10 mg total) by mouth every 6 (six) hours as needed for up to 5 days for moderate pain. 20 tablet 0   No current facility-administered medications for this visit.    Physical Exam:  Heart-regular rate and rhythm Chest-breath sounds are clear to auscultation Incision-chest incision is clean and dry.  There is no erythema and no instability.  I was able to express only a single drop of clear serous fluid from the lower incision.  A new sterile 4 x 4 gauze dressing was applied.  Diagnostic Tests: None today  Impression / Plan: Mr. Otting sternal incision appears to be healing satisfactorily status post KCS sternal plating for nonunion on 01/09/2021 by Dr. Cliffton Asters. He will continue dressing changes as long as there is any drainage from the incision. He  is to complete the course of Keflex as prescribed.  He has a virtual f/u appointment with Dr. Cliffton Asters on 8/26.  Antony Odea, PA-C [facility] [phone]

## 2021-01-13 NOTE — Patient Instructions (Addendum)
May shower.  Continue dressing changes until the wound is completely dry for 24 hours.   Complete the Keflex as prescribed.   No lifting of more than 5 pounds until cleared buy Dr. Cliffton Asters.

## 2021-01-14 ENCOUNTER — Other Ambulatory Visit: Payer: Self-pay

## 2021-01-14 ENCOUNTER — Telehealth: Payer: Self-pay | Admitting: Thoracic Surgery (Cardiothoracic Vascular Surgery)

## 2021-01-14 ENCOUNTER — Telehealth: Payer: Self-pay | Admitting: Licensed Clinical Social Worker

## 2021-01-14 ENCOUNTER — Telehealth: Payer: Self-pay

## 2021-01-14 DIAGNOSIS — Z79899 Other long term (current) drug therapy: Secondary | ICD-10-CM

## 2021-01-14 DIAGNOSIS — R509 Fever, unspecified: Secondary | ICD-10-CM | POA: Diagnosis present

## 2021-01-14 DIAGNOSIS — I110 Hypertensive heart disease with heart failure: Secondary | ICD-10-CM | POA: Diagnosis present

## 2021-01-14 DIAGNOSIS — Z8673 Personal history of transient ischemic attack (TIA), and cerebral infarction without residual deficits: Secondary | ICD-10-CM

## 2021-01-14 DIAGNOSIS — I252 Old myocardial infarction: Secondary | ICD-10-CM

## 2021-01-14 DIAGNOSIS — I5022 Chronic systolic (congestive) heart failure: Secondary | ICD-10-CM | POA: Diagnosis present

## 2021-01-14 DIAGNOSIS — I2510 Atherosclerotic heart disease of native coronary artery without angina pectoris: Secondary | ICD-10-CM | POA: Diagnosis present

## 2021-01-14 DIAGNOSIS — G8929 Other chronic pain: Secondary | ICD-10-CM | POA: Diagnosis present

## 2021-01-14 DIAGNOSIS — E871 Hypo-osmolality and hyponatremia: Secondary | ICD-10-CM | POA: Diagnosis present

## 2021-01-14 DIAGNOSIS — F17210 Nicotine dependence, cigarettes, uncomplicated: Secondary | ICD-10-CM | POA: Diagnosis present

## 2021-01-14 DIAGNOSIS — L7632 Postprocedural hematoma of skin and subcutaneous tissue following other procedure: Principal | ICD-10-CM | POA: Diagnosis present

## 2021-01-14 DIAGNOSIS — D72829 Elevated white blood cell count, unspecified: Secondary | ICD-10-CM | POA: Diagnosis present

## 2021-01-14 DIAGNOSIS — I493 Ventricular premature depolarization: Secondary | ICD-10-CM | POA: Diagnosis present

## 2021-01-14 DIAGNOSIS — Z951 Presence of aortocoronary bypass graft: Secondary | ICD-10-CM

## 2021-01-14 DIAGNOSIS — Y831 Surgical operation with implant of artificial internal device as the cause of abnormal reaction of the patient, or of later complication, without mention of misadventure at the time of the procedure: Secondary | ICD-10-CM | POA: Diagnosis present

## 2021-01-14 DIAGNOSIS — K5900 Constipation, unspecified: Secondary | ICD-10-CM | POA: Diagnosis present

## 2021-01-14 DIAGNOSIS — Z8249 Family history of ischemic heart disease and other diseases of the circulatory system: Secondary | ICD-10-CM

## 2021-01-14 DIAGNOSIS — Z7982 Long term (current) use of aspirin: Secondary | ICD-10-CM

## 2021-01-14 NOTE — Telephone Encounter (Signed)
Mr. Minner daughter called to report he is experiencing worsening pain in the sternal wound area and also has "a lot" of drainage from the wound and swelling on the right side of incision. Also reports shortness of breath.  Advised her to have him go to ED for further evaluation  Viviann Spare C. Dorris Fetch, MD [facility] [phone]

## 2021-01-14 NOTE — Telephone Encounter (Signed)
LATE ENTRY  LCSW reached out to pt on 8/22 to confirm ride to appt, he shares that his daughter will take him to [facility] appt.   LCSW also able to confirm referral for Disability received by [facility] but that they have not yet completed intake interview with pt for application. I shared with Thermon Leyland, [facility] Disability Specialist that she can reach out to me if needed/if she cannot reach pt.   [name], MSW, LCSW [facility] Heart/Vascular Care Navigation  [phone]

## 2021-01-14 NOTE — Telephone Encounter (Signed)
Kathlene November, Pharmacist with [facility] in [place], [place] contacted the office 01/13/21 at 4pm to notify Jillyn Hidden, [place] about prescription 10 mg Oxycodone that was sent in electronically from visit 01/13/21.  He expressed concern about refill. Patient has a known history of substance abuse.  Patient was discharged from the hospital 01/09/21 s/p sternal plating 01/06/21. He was prescribed at discharge 10 mg Oxycodone #20. He then on 01/10/21 was seen in the office by his PCP, Grayling Congress, FNP as a follow-up from surgery and was prescribed Percocet 5/325 mg #15. Per patient he stated that it was because he needed more at "night". Per pharmacist, patient was given prescription because "10 mg was too strong".   He was seen by [facility] 01/13/21 Jillyn Hidden, PA whom prescribed refill of Oxycodone 10 mg because per patient he was completely out. He never mentioned the Percocet that was prescribed by his PCP.  Spoke with Lowella Dandy, PA and Dr. Dorris Fetch about concerns whom stated patient would not get current prescription filled at this time and all narcotics should be filled by his PCP. Stated he should not get narcotics filled by multiple physicians.  Spoke with patient to state that his prescription written 01/13/21 would not be refilled due to safety concerns and that he should get all narcotics from his PCP going forward. He stated that he can no longer go back to his PCP because his medicaid was only for "family planning" and he does not have money to pay for future visits. He stated that he continues to have right sided chest pain that sounds musculoskeletal in nature as it comes and goes and hurts more with movement. Advised that he could continue taking Tylenol/ Ibuprofen, but patient stated that it does not help. He also mentioned going to a pain clinic in the past and getting "number 10's, 120 at a time", "I quit going because I got tired of going to the appointments". Advised that if pain is unbearable  he should call 911 or go to the nearest emergency room for evaluation. He acknowledged receipt.  Contacted [facility] pharmacy, Kathlene November and requested prescription be cancelled. He acknowledged receipt.  Will make Dr. Cliffton Asters aware.

## 2021-01-15 ENCOUNTER — Emergency Department (HOSPITAL_COMMUNITY): Payer: Medicaid Other

## 2021-01-15 ENCOUNTER — Inpatient Hospital Stay (HOSPITAL_COMMUNITY)
Admission: EM | Admit: 2021-01-15 | Discharge: 2021-01-20 | DRG: 920 | Disposition: A | Discharge status: Home / Self Care | Payer: Medicaid Other | Attending: Thoracic Surgery (Cardiothoracic Vascular Surgery) | Admitting: Thoracic Surgery (Cardiothoracic Vascular Surgery)

## 2021-01-15 ENCOUNTER — Other Ambulatory Visit: Payer: Self-pay

## 2021-01-15 DIAGNOSIS — R509 Fever, unspecified: Secondary | ICD-10-CM | POA: Diagnosis present

## 2021-01-15 DIAGNOSIS — E871 Hypo-osmolality and hyponatremia: Secondary | ICD-10-CM | POA: Diagnosis present

## 2021-01-15 DIAGNOSIS — Z951 Presence of aortocoronary bypass graft: Secondary | ICD-10-CM | POA: Diagnosis not present

## 2021-01-15 DIAGNOSIS — I5022 Chronic systolic (congestive) heart failure: Secondary | ICD-10-CM | POA: Diagnosis present

## 2021-01-15 DIAGNOSIS — I493 Ventricular premature depolarization: Secondary | ICD-10-CM | POA: Diagnosis present

## 2021-01-15 DIAGNOSIS — Y831 Surgical operation with implant of artificial internal device as the cause of abnormal reaction of the patient, or of later complication, without mention of misadventure at the time of the procedure: Secondary | ICD-10-CM | POA: Diagnosis present

## 2021-01-15 DIAGNOSIS — Z7982 Long term (current) use of aspirin: Secondary | ICD-10-CM | POA: Diagnosis not present

## 2021-01-15 DIAGNOSIS — R109 Unspecified abdominal pain: Secondary | ICD-10-CM

## 2021-01-15 DIAGNOSIS — Z8673 Personal history of transient ischemic attack (TIA), and cerebral infarction without residual deficits: Secondary | ICD-10-CM | POA: Diagnosis not present

## 2021-01-15 DIAGNOSIS — K5900 Constipation, unspecified: Secondary | ICD-10-CM | POA: Diagnosis present

## 2021-01-15 DIAGNOSIS — G8929 Other chronic pain: Secondary | ICD-10-CM | POA: Diagnosis present

## 2021-01-15 DIAGNOSIS — R0789 Other chest pain: Secondary | ICD-10-CM | POA: Diagnosis present

## 2021-01-15 DIAGNOSIS — Z8249 Family history of ischemic heart disease and other diseases of the circulatory system: Secondary | ICD-10-CM | POA: Diagnosis not present

## 2021-01-15 DIAGNOSIS — I110 Hypertensive heart disease with heart failure: Secondary | ICD-10-CM | POA: Diagnosis present

## 2021-01-15 DIAGNOSIS — I2510 Atherosclerotic heart disease of native coronary artery without angina pectoris: Secondary | ICD-10-CM | POA: Diagnosis present

## 2021-01-15 DIAGNOSIS — I252 Old myocardial infarction: Secondary | ICD-10-CM | POA: Diagnosis not present

## 2021-01-15 DIAGNOSIS — Z79899 Other long term (current) drug therapy: Secondary | ICD-10-CM | POA: Diagnosis not present

## 2021-01-15 DIAGNOSIS — D72829 Elevated white blood cell count, unspecified: Secondary | ICD-10-CM | POA: Diagnosis present

## 2021-01-15 DIAGNOSIS — L7632 Postprocedural hematoma of skin and subcutaneous tissue following other procedure: Secondary | ICD-10-CM | POA: Diagnosis present

## 2021-01-15 DIAGNOSIS — F17210 Nicotine dependence, cigarettes, uncomplicated: Secondary | ICD-10-CM | POA: Diagnosis present

## 2021-01-15 LAB — COMPREHENSIVE METABOLIC PANEL
ALT: 21 U/L (ref 0–44)
AST: 23 U/L (ref 15–41)
Albumin: 3.2 g/dL — ABNORMAL LOW (ref 3.5–5.0)
Alkaline Phosphatase: 54 U/L (ref 38–126)
Anion gap: 8 (ref 5–15)
BUN: 9 mg/dL (ref 6–20)
CO2: 23 mmol/L (ref 22–32)
Calcium: 8.8 mg/dL — ABNORMAL LOW (ref 8.9–10.3)
Chloride: 110 mmol/L (ref 98–111)
Creatinine, Ser: 0.88 mg/dL (ref 0.61–1.24)
GFR, Estimated: >60 mL/min (ref >60)
Glucose, Bld: 148 mg/dL — ABNORMAL HIGH (ref 70–99)
Potassium: 3.9 mmol/L (ref 3.5–5.1)
Sodium: 141 mmol/L (ref 135–145)
Total Bilirubin: 0.4 mg/dL (ref 0.3–1.2)
Total Protein: 6.7 g/dL (ref 6.5–8.1)

## 2021-01-15 LAB — CBC WITH DIFFERENTIAL/PLATELET
Abs Immature Granulocytes: 0.03 10*3/uL (ref 0.00–0.07)
Basophils Absolute: 0.1 10*3/uL (ref 0.0–0.1)
Basophils Relative: 1 %
Eosinophils Absolute: 0.7 10*3/uL — ABNORMAL HIGH (ref 0.0–0.5)
Eosinophils Relative: 7 %
HCT: 40.8 % (ref 39.0–52.0)
Hemoglobin: 13.7 g/dL (ref 13.0–17.0)
Immature Granulocytes: 0 %
Lymphocytes Relative: 36 %
Lymphs Abs: 3.8 10*3/uL (ref 0.7–4.0)
MCH: 32.9 pg (ref 26.0–34.0)
MCHC: 33.6 g/dL (ref 30.0–36.0)
MCV: 98.1 fL (ref 80.0–100.0)
Monocytes Absolute: 0.7 10*3/uL (ref 0.1–1.0)
Monocytes Relative: 7 %
Neutro Abs: 5.2 10*3/uL (ref 1.7–7.7)
Neutrophils Relative %: 49 %
Platelets: 289 10*3/uL (ref 150–400)
RBC: 4.16 MIL/uL — ABNORMAL LOW (ref 4.22–5.81)
RDW: 13.5 % (ref 11.5–15.5)
WBC: 10.6 10*3/uL — ABNORMAL HIGH (ref 4.0–10.5)
nRBC: 0 % (ref 0.0–0.2)

## 2021-01-15 LAB — CBC
HCT: 42.5 % (ref 39.0–52.0)
Hemoglobin: 13.8 g/dL (ref 13.0–17.0)
MCH: 32.4 pg (ref 26.0–34.0)
MCHC: 32.5 g/dL (ref 30.0–36.0)
MCV: 99.8 fL (ref 80.0–100.0)
Platelets: 256 10*3/uL (ref 150–400)
RBC: 4.26 MIL/uL (ref 4.22–5.81)
RDW: 13.7 % (ref 11.5–15.5)
WBC: 10.4 10*3/uL (ref 4.0–10.5)
nRBC: 0 % (ref 0.0–0.2)

## 2021-01-15 LAB — BASIC METABOLIC PANEL
Anion gap: 7 (ref 5–15)
BUN: 9 mg/dL (ref 6–20)
CO2: 25 mmol/L (ref 22–32)
Calcium: 9 mg/dL (ref 8.9–10.3)
Chloride: 109 mmol/L (ref 98–111)
Creatinine, Ser: 0.86 mg/dL (ref 0.61–1.24)
GFR, Estimated: >60 mL/min (ref >60)
Glucose, Bld: 129 mg/dL — ABNORMAL HIGH (ref 70–99)
Potassium: 3.6 mmol/L (ref 3.5–5.1)
Sodium: 141 mmol/L (ref 135–145)

## 2021-01-15 LAB — TROPONIN I (HIGH SENSITIVITY)
Troponin I (High Sensitivity): 12 ng/L (ref <18)
Troponin I (High Sensitivity): 12 ng/L (ref <18)

## 2021-01-15 LAB — BRAIN NATRIURETIC PEPTIDE: B Natriuretic Peptide: 205.5 pg/mL — ABNORMAL HIGH (ref 0.0–100.0)

## 2021-01-15 MED ORDER — ASPIRIN EC 81 MG PO TBEC
81.0000 mg | DELAYED_RELEASE_TABLET | Freq: Every day | ORAL | Status: DC
  Administered 2021-01-15 – 2021-01-20 (×6): 81 mg via ORAL
  Filled 2021-01-15 (×6): qty 1

## 2021-01-15 MED ORDER — HYDROMORPHONE HCL 1 MG/ML IJ SOLN
1.0000 mg | Freq: Once | INTRAMUSCULAR | Status: DC
  Filled 2021-01-15: qty 1

## 2021-01-15 MED ORDER — BENZONATATE 100 MG PO CAPS
100.0000 mg | ORAL_CAPSULE | Freq: Every day | ORAL | Status: DC | PRN
  Administered 2021-01-15: 100 mg via ORAL
  Administered 2021-01-19 (×2): 200 mg via ORAL
  Filled 2021-01-15: qty 2
  Filled 2021-01-15: qty 1
  Filled 2021-01-15: qty 2

## 2021-01-15 MED ORDER — MORPHINE SULFATE (PF) 4 MG/ML IV SOLN
4.0000 mg | Freq: Once | INTRAVENOUS | Status: AC
Start: 2021-01-15 — End: 2021-01-15
  Administered 2021-01-15: 4 mg via INTRAVENOUS
  Filled 2021-01-15: qty 1

## 2021-01-15 MED ORDER — OXYCODONE HCL 5 MG PO TABS
5.0000 mg | ORAL_TABLET | ORAL | Status: DC | PRN
  Administered 2021-01-15 (×2): 5 mg via ORAL
  Filled 2021-01-15 (×2): qty 1

## 2021-01-15 MED ORDER — SODIUM CHLORIDE 0.9% FLUSH
3.0000 mL | Freq: Two times a day (BID) | INTRAVENOUS | Status: DC
  Administered 2021-01-15 – 2021-01-19 (×7): 3 mL via INTRAVENOUS

## 2021-01-15 MED ORDER — LOSARTAN POTASSIUM 25 MG PO TABS
25.0000 mg | ORAL_TABLET | Freq: Every day | ORAL | Status: DC
  Administered 2021-01-15 – 2021-01-20 (×6): 25 mg via ORAL
  Filled 2021-01-15 (×6): qty 1

## 2021-01-15 MED ORDER — OXYCODONE-ACETAMINOPHEN 5-325 MG PO TABS
1.0000 | ORAL_TABLET | Freq: Once | ORAL | Status: AC
Start: 2021-01-15 — End: 2021-01-15
  Administered 2021-01-15: 1 via ORAL
  Filled 2021-01-15: qty 1

## 2021-01-15 MED ORDER — FUROSEMIDE 20 MG PO TABS
20.0000 mg | ORAL_TABLET | Freq: Every day | ORAL | Status: DC
  Administered 2021-01-15 – 2021-01-20 (×6): 20 mg via ORAL
  Filled 2021-01-15 (×6): qty 1

## 2021-01-15 MED ORDER — ENOXAPARIN SODIUM 40 MG/0.4ML IJ SOSY
40.0000 mg | PREFILLED_SYRINGE | INTRAMUSCULAR | Status: DC
  Administered 2021-01-15 – 2021-01-19 (×5): 40 mg via SUBCUTANEOUS
  Filled 2021-01-15 (×5): qty 0.4

## 2021-01-15 MED ORDER — CARVEDILOL 6.25 MG PO TABS
6.2500 mg | ORAL_TABLET | Freq: Two times a day (BID) | ORAL | Status: DC
  Administered 2021-01-15 – 2021-01-20 (×11): 6.25 mg via ORAL
  Filled 2021-01-15 (×10): qty 1
  Filled 2021-01-15: qty 2

## 2021-01-15 MED ORDER — OXYCODONE HCL 5 MG PO TABS
10.0000 mg | ORAL_TABLET | ORAL | Status: DC | PRN
  Administered 2021-01-15 – 2021-01-20 (×22): 10 mg via ORAL
  Filled 2021-01-15 (×22): qty 2

## 2021-01-15 MED ORDER — ATORVASTATIN CALCIUM 80 MG PO TABS
80.0000 mg | ORAL_TABLET | Freq: Every day | ORAL | Status: DC
  Administered 2021-01-15 – 2021-01-20 (×6): 80 mg via ORAL
  Filled 2021-01-15: qty 2
  Filled 2021-01-15 (×5): qty 1

## 2021-01-15 MED ORDER — NICOTINE 14 MG/24HR TD PT24
14.0000 mg | MEDICATED_PATCH | Freq: Every day | TRANSDERMAL | Status: DC
  Administered 2021-01-17: 14 mg via TRANSDERMAL
  Filled 2021-01-15 (×6): qty 1

## 2021-01-15 MED ORDER — CEPHALEXIN 500 MG PO CAPS
500.0000 mg | ORAL_CAPSULE | Freq: Four times a day (QID) | ORAL | Status: AC
  Administered 2021-01-15 – 2021-01-16 (×8): 500 mg via ORAL
  Filled 2021-01-15 (×2): qty 1
  Filled 2021-01-15 (×2): qty 2
  Filled 2021-01-15 (×4): qty 1

## 2021-01-15 NOTE — ED Notes (Signed)
Pt reporting that he does not want dilaudid d/t it making him feel like he can't breath. Dr. Wilkie Aye notified

## 2021-01-15 NOTE — ED Provider Notes (Signed)
Emergency Medicine Provider Triage Evaluation Note  Matthew Tapia , a 55 y.o. male  was evaluated in triage.  Pt complains of chest pain.  The patient had a quadruple bypass surgery in December.  On 8/15, he underwent sternal plating due to sternal manubrial disassociation with nonunion.  He reports that he was feeling much better, but today developed recurrent episodes of right-sided chest pain.  He reports the pain is sharp and stabbing.  Pain is worse with taking deep breaths and laughing or coughing.  He has noticed minimally increased drainage over his surgical dressing.  He reports that when pain intensifies that he feels short of breath.  He has been wheezing, but is a current, everyday smoker.  He denies leg swelling, but reports that prior to his procedure on August 15 that he was having swelling in his bilateral lower legs.  He reports compliance with his home furosemide.  Denies vomiting, abdominal pain, fever, chills, sore throat, nasal congestion, diarrhea, palpitations, dizziness, lightheadedness.  Review of Systems  Positive: Chest pain Negative: Fever, chills, vomiting, abdominal pain, sore throat, nasal congestion, diarrhea, palpitations, dizziness, lightheadedness  Physical Exam  BP 131/83 (BP Location: Left Arm)   Pulse 77   Temp 98.1 F (36.7 C)   Resp 17   Ht 5\' 6"  (1.676 m)   Wt 112.9 kg   SpO2 98%   BMI 40.19 kg/m  Gen:   Awake, appears uncomfortable Resp:  Inspiratory and expiratory wheezes bilaterally MSK:   Moves extremities without difficulty  Other:  Abdomen is nontender.  Minimal serous drainage noted from the inferior portion of the surgical wound.  No obvious dehiscence.  No redness, warmth, red streaking.  Medical Decision Making  Medically screening exam initiated at 12:39 AM.  Appropriate orders placed.  Matthew Tapia was informed that the remainder of the evaluation will be completed by another provider, this initial triage assessment does not replace  that evaluation, and the importance of remaining in the ED until their evaluation is complete.  Spoke with Dr. [name], CT surgery, as patient reported that he wanted to be made aware of when the patient was in the ED.  Dr. Dorris Fetch would like a call back when his work-up is complete.  Labs and imaging have been ordered.  He has been given Percocet for pain in the ED.  He will require further work-up and evaluation in the emergency department.   Dorris Fetch, PA-C 01/15/21 0111    01/17/21, MD 01/15/21 0630

## 2021-01-15 NOTE — ED Notes (Signed)
Dr. Horton at bedside. 

## 2021-01-15 NOTE — ED Notes (Signed)
Attempted to give reportx1 

## 2021-01-15 NOTE — ED Provider Notes (Signed)
[facility] EMERGENCY DEPARTMENT Provider Note   CSN: [id number] Arrival date & time: 01/14/21  2228     History Chief Complaint  Patient presents with   Post-op Problem   Chest Pain    Matthew Tapia is a 55 y.o. male.  HPI     This is a 55 year old male with a history of coronary artery disease status post quadruple bypass in December 2021, CHF, smoking, hypertension who presents with chest pain.  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  He states that things have been going well.  However, he had recurrence of significant pain after a forceful cough.  He states over the last 24-48 hours, he has had increasing pain and noted swelling mostly in the right pectoral region.  He is also noted some continued drainage from the wound itself.  No purulence noted.  No erythema.  He denies fever.  He does report some shortness of breath and cough.  He continues to smoke.  He is taking Percocet at home with minimal relief.  Currently he rates his pain 8 out of 10.  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   Lactic acidosis 10/04/2020   Dehydration 10/04/2020   Chronic combined systolic and diastolic CHF (congestive heart failure) (HCC) 10/04/2020   Acute encephalopathy 10/03/2020   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   CAD (coronary artery disease) 05/17/2020   HFrEF (heart failure with  reduced ejection fraction) (HCC)    CAD, 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   Polysubstance abuse (HCC) 05/16/2020   Cardiomyopathy, ischemic 05/16/2020   Hyperlipidemia LDL goal <70 05/16/2020   Essential hypertension 05/16/2020   Elevated hemoglobin A1c 05/16/2020   Acute ST elevation myocardial infarction (STEMI) of inferior wall (HCC) 05/14/2020   Tobacco abuse 05/14/2020   Leukocytosis 05/14/2020   Anxiety 05/14/2020   Cough 05/14/2020    Past Surgical History:  Procedure Laterality Date   CARDIAC CATHETERIZATION     CORONARY ANGIOGRAPHY N/A 05/20/2020   Procedure: CORONARY ANGIOGRAPHY;  Surgeon: Kathleene HazelMcAlhany, Christopher D, MD;  Location: [facility] INVASIVE CV LAB;  Service: Cardiovascular;  Laterality: N/A;   CORONARY ARTERY BYPASS GRAFT N/A 05/21/2020   Procedure: CORONARY ARTERY BYPASS GRAFTING TIMES FOUR USING LEFT INTERNAL MAMMARY ARTERY  AND ENDOSCOPICALLY HARVESTED RIGHT GREATER SAPHANOUS VEIN.;  Surgeon: Corliss SkainsLightfoot, Harrell O, MD;  Location: [facility] OR;  Service: Open Heart Surgery;  Laterality: N/A;  flow track   CORONARY THROMBECTOMY N/A 05/20/2020   Procedure: Coronary Thrombectomy;  Surgeon: Kathleene HazelMcAlhany, Christopher D, MD;  Location: [facility] INVASIVE CV LAB;  Service: Cardiovascular;  Laterality: N/A;   CORONARY/GRAFT ACUTE MI REVASCULARIZATION N/A 05/14/2020   Procedure: Coronary/Graft Acute MI Revascularization;  Surgeon: Iran OuchArida, Muhammad A, MD;  Location: [facility] INVASIVE CV LAB;  Service: Cardiovascular;  Laterality: N/A;   CORONARY/GRAFT ACUTE MI REVASCULARIZATION N/A 05/20/2020  Procedure: Coronary/Graft Acute MI Revascularization;  Surgeon: Kathleene Hazel, MD;  Location: [facility];  Service: Cardiovascular;  Laterality: N/A;   ENDOVEIN HARVEST OF GREATER SAPHENOUS VEIN Right 05/21/2020   Procedure: ENDOVEIN HARVEST OF RIGHT GREATER SAPHENOUS VEIN;  Surgeon: Corliss Skains, MD;  Location: [facility] OR;  Service: Open Heart Surgery;  Laterality: Right;   INTRAVASCULAR PRESSURE  WIRE/FFR STUDY N/A 05/16/2020   Procedure: INTRAVASCULAR PRESSURE WIRE/FFR STUDY;  Surgeon: Iran Ouch, MD;  Location: [facility] INVASIVE CV LAB;  Service: Cardiovascular;  Laterality: N/A;   LEFT HEART CATH AND CORONARY ANGIOGRAPHY N/A 05/14/2020   Procedure: LEFT HEART CATH AND CORONARY ANGIOGRAPHY;  Surgeon: Iran Ouch, MD;  Location: [facility] INVASIVE CV LAB;  Service: Cardiovascular;  Laterality: N/A;   LEFT HEART CATH AND CORONARY ANGIOGRAPHY N/A 05/16/2020   Procedure: LEFT HEART CATH AND CORONARY ANGIOGRAPHY;  Surgeon: Iran Ouch, MD;  Location: [facility] INVASIVE CV LAB;  Service: Cardiovascular;  Laterality: N/A;   RIB PLATING N/A 01/06/2021   Procedure: STERNAL PLATING;  Surgeon: Corliss Skains, MD;  Location: [facility] OR;  Service: Thoracic;  Laterality: N/A;   SHOULDER SURGERY     TEE WITHOUT CARDIOVERSION N/A 05/21/2020   Procedure: TRANSESOPHAGEAL ECHOCARDIOGRAM (TEE);  Surgeon: Corliss Skains, MD;  Location: [facility] OR;  Service: Open Heart Surgery;  Laterality: N/A;       Family History  Problem Relation Age of Onset   Heart attack Father     Social History   Tobacco Use   Smoking status: Every Day    Packs/day: 0.25    Years: 42.00    Pack years: 10.50    Types: Cigarettes   Smokeless tobacco: Never   Tobacco comments:    1-2 cigarettes "every now and then"  Vaping Use   Vaping Use: Never used  Substance Use Topics   Alcohol use: No   Drug use: Never    Home Medications Prior to Admission medications   Medication Sig Start Date End Date Taking? Authorizing Provider  aspirin EC 81 MG tablet Take 1 tablet (81 mg total) by once mouth daily. Swallow whole. 11/28/20   Iran Ouch, MD  atorvastatin (LIPITOR) 80 MG tablet Take 1 tablet (80 mg total) by mouth once daily. 12/27/20 03/27/21  Furth, Cadence H, PA-C  carvedilol (COREG) 6.25 MG tablet Take 1 tablet (6.25 mg total) by mouth 2 (two) times daily. 12/27/20   Furth, Cadence H, PA-C  cephALEXin  (KEFLEX) 500 MG capsule Take 1 capsule (500 mg total) by mouth 4 (four) times daily for 7 days. 01/09/21 01/16/21  Leary Roca, PA-C  furosemide (LASIX) 20 MG tablet Take 1 tablet (20 mg total) by mouth once daily. 12/27/20   Furth, Cadence H, PA-C  losartan (COZAAR) 25 MG tablet Take 1 tablet (25 mg total) by mouth once daily. 12/27/20 03/27/21  Furth, Cadence H, PA-C  Oxycodone HCl 10 MG TABS Take 1 tablet (10 mg total) by mouth every 6 (six) hours as needed for up to 5 days. 01/13/21 01/18/21  Leary Roca, PA-C    Allergies    Patient has no known allergies.  Review of Systems   Review of Systems  Constitutional:  Negative for fever.  Respiratory:  Positive for cough and shortness of breath.   Cardiovascular:  Positive for chest pain.  Gastrointestinal:  Negative for abdominal pain, nausea and vomiting.  Genitourinary:  Negative for dysuria.  All other systems reviewed and are negative.  Physical Exam Updated Vital Signs BP 130/80   Pulse 70   Temp 98.1 F (36.7 C)   Resp 20   Ht 1.676 m (5\' 6" )   Wt 112.9 kg   SpO2 97%   BMI 40.19 kg/m   Physical Exam Vitals and nursing note reviewed.  Constitutional:      Appearance: He is well-developed. He is obese.  HENT:     Head: Normocephalic and atraumatic.  Eyes:     Pupils: Pupils are equal, round, and reactive to light.  Cardiovascular:     Rate and Rhythm: Normal rate and regular rhythm.     Heart sounds: Normal heart sounds. No murmur heard. Pulmonary:     Effort: Pulmonary effort is normal. No respiratory distress.     Breath sounds: Wheezing present.  Chest:     Comments: Well-healing midline sternotomy incision, slight serosanguineous drainage noted in the inferior aspect of the room, no adjacent erythema, no fluctuance Abdominal:     General: Bowel sounds are normal.     Palpations: Abdomen is soft.     Tenderness: There is no abdominal tenderness. There is no rebound.  Musculoskeletal:     Cervical  back: Neck supple.  Lymphadenopathy:     Cervical: No cervical adenopathy.  Skin:    General: Skin is warm and dry.  Neurological:     Mental Status: He is alert and oriented to person, place, and time.  Psychiatric:        Mood and Affect: Mood normal.    ED Results / Procedures / Treatments   Labs (all labs ordered are listed, but only abnormal results are displayed) Labs Reviewed  CBC WITH DIFFERENTIAL/PLATELET - Abnormal; Notable for the following components:      Result Value   WBC 10.6 (*)    RBC 4.16 (*)    Eosinophils Absolute 0.7 (*)    All other components within normal limits  BASIC METABOLIC PANEL - Abnormal; Notable for the following components:   Glucose, Bld 129 (*)    All other components within normal limits  BRAIN NATRIURETIC PEPTIDE - Abnormal; Notable for the following components:   B Natriuretic Peptide 205.5 (*)    All other components within normal limits  TROPONIN I (HIGH SENSITIVITY)  TROPONIN I (HIGH SENSITIVITY)    EKG EKG Interpretation  Date/Time:  Wednesday January 15 2021 00:47:33 EDT Ventricular Rate:  77 PR Interval:  128 QRS Duration: 100 QT Interval:  408 QTC Calculation: 461 R Axis:   14 Text Interpretation: Normal sinus rhythm Possible Lateral infarct , age undetermined Inferior infarct , age undetermined Abnormal ECG Confirmed by [id number] (Ross Marcus) on 01/15/2021 4:11:39 AM  Radiology DG Chest 2 View  Result Date: 01/15/2021 CLINICAL DATA:  Chest pain. EXAM: CHEST - 2 VIEW COMPARISON:  Chest radiograph dated 11/22/2020. FINDINGS: The lungs are clear. There is no pleural effusion pneumothorax. The cardiac silhouette is within limits. Median sternotomy wires and CABG vascular clips. No acute osseous pathology. IMPRESSION: No active cardiopulmonary disease. Electronically Signed   By: 01/23/2021 M.D.   On: 01/15/2021 01:24   CT Chest Wo Contrast  Result Date: 01/15/2021 CLINICAL DATA:  55 year old male with history of chest  wall pain. EXAM: CT CHEST WITHOUT CONTRAST TECHNIQUE: Multidetector CT imaging of the chest was performed following the standard protocol without IV contrast. COMPARISON:  Chest CT 12/17/2020. FINDINGS: Cardiovascular: Heart size is normal. Small amount of anterior pericardial fluid and/or thickening immediately deep to the sternum, unlikely  to be of any hemodynamic significance at this time. No pericardial calcification. There is aortic atherosclerosis, as well as atherosclerosis of the great vessels of the mediastinum and the coronary arteries, including calcified atherosclerotic plaque in the left main, left anterior descending, left circumflex and right coronary arteries. Status post median sternotomy for CABG including LIMA to the LAD. Mediastinum/Nodes: In the inferior aspect of the anterior mediastinum (axial image 92 of series 3) just deep to the sternum, there is a poorly defined low-attenuation collection of material with surrounding inflammatory changes measuring approximately 6.3 x 3.3 cm. No pathologically enlarged mediastinal or hilar lymph nodes. Please note that accurate exclusion of hilar adenopathy is limited on noncontrast CT scans. Esophagus is unremarkable in appearance. No axillary lymphadenopathy. Lungs/Pleura: No acute consolidative airspace disease. No pleural effusions. No suspicious appearing pulmonary nodules or masses are noted. Upper Abdomen: Tiny noncalcified gallstone lying dependently in the gallbladder. Adreniform thickening of the left adrenal gland, likely reflective of adrenal hyperplasia. Musculoskeletal: Median sternotomy wires are noted. Healing sternotomy wound, closely approximated superiorly. The inferior aspect of the sternotomy wound does not approximate, measuring up to 1.7 cm in width (axial image 82 of series 3), which is increased compared to the prior examination. There are several new fixation screws in both sides of the sternum with a faintly visualized fixation  plate device hardware (KLS plate). Extensive surrounding inflammation and small volume of adjacent fluid noted associated with the sternal wound, most evident inferiorly (discussed above) in the anterior mediastinum. There are no aggressive appearing lytic or blastic lesions noted in the visualized portions of the skeleton. IMPRESSION: 1. Postoperative changes related to recent KLS plating of the inferior aspect of the sternum. There is extensive inflammation and a small amount of postoperative fluid associated with this healing wound, most evident inferiorly extending into the anterior mediastinum. No well-defined abscess is confidently identified at this time, however, the inferior aspect of the wound is concerning. This likely represents resolving postoperative seroma/hematoma, but follow-up imaging may be warranted should the patient exhibit signs or symptoms of worsening infection. No other potentially acute findings are appreciated on today's noncontrast CT examination. 2. Aortic atherosclerosis, in addition to left main and 3 vessel coronary artery disease. Status post median sternotomy for CABG including LIMA to the LAD. 3. Cholelithiasis. Aortic Atherosclerosis (ICD10-I70.0). Electronically Signed   By: Trudie Reed M.D.   On: 01/15/2021 06:06    Procedures Procedures   Medications Ordered in ED Medications  HYDROmorphone (DILAUDID) injection 1 mg (1 mg Intravenous Patient Refused/Not Given 01/15/21 0441)  oxyCODONE-acetaminophen (PERCOCET/ROXICET) 5-325 MG per tablet 1 tablet (1 tablet Oral Given 01/15/21 0055)  morphine 4 MG/ML injection 4 mg (4 mg Intravenous Given 01/15/21 0455)    ED Course  I have reviewed the triage vital signs and the nursing notes.  Pertinent labs & imaging results that were available during my care of the patient were reviewed by me and considered in my medical decision making (see chart for details).  Clinical Course as of 01/15/21 1610  Wed Jan 15, 2021  [id number]  Spoke with Dr. Dorris Fetch.  He will have Dr. Cliffton Asters review images and make recommendations. [CH]    Clinical Course User Index [CH] Opaline Reyburn, Mayer Masker, MD   MDM Rules/Calculators/A&P                           Patient presents with postoperative worsening chest pain.  Recent sternal plating secondary to nonunion.  He has also noted some drainage at his surgical site.  He is nontoxic and vital signs are reassuring.  His incision appears well-healing.  He has a small area in the inferior portion of the incision that is draining.  There is no adjacent erythema.  No significant fluctuance.  There is some tenderness to palpation.  His lab work is largely reassuring.  Leukocytosis is improving from postoperative period.  We will reimage his chest to ensure no internal dehiscence, fluid collections, or abscess.  CT reviewed.  Radiology concerning for fluid collection at the inferior portion of the wound which would corespond with his drainage.  Considerations include seroma, abscess, hematoma.  Spoke with Dr. Dorris Fetch.  CT surgery to review CT and make recommendations. Final Clinical Impression(s) / ED Diagnoses Final diagnoses:  None    Rx / DC Orders ED Discharge Orders     None        Deantre Bourdon, Mayer Masker, MD 01/15/21 [phone]

## 2021-01-15 NOTE — ED Triage Notes (Signed)
Pt states he qradruple bypass in December and tonight he feels like his chest has come apart again.

## 2021-01-15 NOTE — H&P (Addendum)
[address]       [place] [address]             [phone]        Matthew Tapia [facility] Medical Record #[id number] Date of Birth: 1966-02-06  Referring: No ref. provider found Primary Care: Miki Kins, FNP Primary Cardiologist:Muhammad Kirke Corin, MD  Chief Complaint:    Chief Complaint  Patient presents with   Post-op Problem   Chest Pain    History of Present Illness:       Matthew Tapia is a 55 year old male with past history of coronary artery disease status post coronary bypass grafting in March 2022.  His initial postoperative course was uneventful but several months later he had several episodes of heavy coughing and sneezing and subsequently developed increased pain in the lower portion of the sternum.  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  He underwent KLS plating of the inferior portion of the sternum by Dr. Cliffton Asters on 01/06/2021 and tolerated this procedure well.  On the day after surgery, he had a coughing episode while in the hospital saturating his chest dressing with mucoid drainage.  He was started on IV Zosyn and vancomycin empirically.  A Prevena negative pressure dressing was placed over the incision.  He was observed in the hospital for another 24 hours and had no further drainage from the sternal incision.  He was discharged home on 01/09/2021 with a Prevena dressing in place and on oral Keflex. He returned to the office on 01/13/2021 for a wound check. Since hospital discharge the patient reported the Praveena apparatus stopped working 2 days ago so he removed it.  Since then, he has been doing dressing changes daily.  The dressing removed in the office today had been placed 3 hours before and  had just very minor staining on it.  He denies any fever or movement in his breastbone.  Overall, he felt much better than prior to surgery.  He has been taking oral Keflex as prescribed.  Matthew Tapia then  called our office after hours last night stating that he was having worsening pain and experiencing a lot of sternal drainage. He also stated that he had some swelling on the right side of his incision. He was advised by Dr. Dorris Fetch to go to the ED for further evaluation. He states that after a forceful cough he experienced significant pain. He has had drainage especially from the lower portion of his sternal incision. He underwent a CT scan of the chest which showed: Extensive inflammation and a small amount of postoperative fluid associated with this healing wound, most evident inferiorly extending into the anterior mediastinum.  No well-defined abscess is confidently identified at this time however the inferior aspect of the wound is concerning.  This likely represents resolving postoperative seroma/hematoma. He will be admitted for further workup.     Current Activity/ Functional Status: Patient was independent with mobility/ambulation, transfers, ADL's, IADL's.   Zubrod Score: At the time of surgery this patient's most appropriate activity status/level should be described as: []     0    Normal activity, no symptoms [x]     1    Restricted in physical strenuous activity but ambulatory, able to do out light work []     2    Ambulatory and capable of self care, unable to do work activities, up and about  more than 50%  Of the time                            []     3    Only limited self care, in bed greater than 50% of waking hours []     4    Completely disabled, no self care, confined to bed or chair []     5    Moribund  Past Medical History:  Diagnosis Date   Anxiety    Asthma    CAD, multiple vessel    CHF (congestive heart failure) (HCC)    Dyslipidemia, goal LDL below 70    Former smoker    Quit 2021   HFrEF (heart failure with reduced ejection fraction) (HCC)    Hypertension    Polysubstance abuse (HCC)    UDS positive for cocaine, amphetamines, benzos 05/15/20    ST elevation myocardial infarction (STEMI) of inferior wall (HCC)    Stroke (HCC) 10/03/2020    Past Surgical History:  Procedure Laterality Date   CARDIAC CATHETERIZATION     CORONARY ANGIOGRAPHY N/A 05/20/2020   Procedure: CORONARY ANGIOGRAPHY;  Surgeon: 05/17/20, MD;  Location: [facility];  Service: Cardiovascular;  Laterality: N/A;   CORONARY ARTERY BYPASS GRAFT N/A 05/21/2020   Procedure: CORONARY ARTERY BYPASS GRAFTING TIMES FOUR USING LEFT INTERNAL MAMMARY ARTERY  AND ENDOSCOPICALLY HARVESTED RIGHT GREATER SAPHANOUS VEIN.;  Surgeon: 05/22/2020, MD;  Location: [facility] OR;  Service: Open Heart Surgery;  Laterality: N/A;  flow track   CORONARY THROMBECTOMY N/A 05/20/2020   Procedure: Coronary Thrombectomy;  Surgeon: 05/23/2020, MD;  Location: [facility];  Service: Cardiovascular;  Laterality: N/A;   CORONARY/GRAFT ACUTE MI REVASCULARIZATION N/A 05/14/2020   Procedure: Coronary/Graft Acute MI Revascularization;  Surgeon: 05/22/2020, MD;  Location: [facility] INVASIVE CV LAB;  Service: Cardiovascular;  Laterality: N/A;   CORONARY/GRAFT ACUTE MI REVASCULARIZATION N/A 05/20/2020   Procedure: Coronary/Graft Acute MI Revascularization;  Surgeon: 05/16/2020, MD;  [facility]: [facility];  Service: Cardiovascular;  Laterality: N/A;   ENDOVEIN HARVEST OF GREATER SAPHENOUS VEIN Right 05/21/2020   Procedure: ENDOVEIN HARVEST OF RIGHT GREATER SAPHENOUS VEIN;  Surgeon: 05/22/2020, MD;  Location: [facility];  Service: Open Heart Surgery;  Laterality: Right;   INTRAVASCULAR PRESSURE WIRE/FFR STUDY N/A 05/16/2020   Procedure: INTRAVASCULAR PRESSURE WIRE/FFR STUDY;  Surgeon: 05/23/2020, MD;  Location: [facility] INVASIVE CV LAB;  Service: Cardiovascular;  Laterality: N/A;   LEFT HEART CATH AND CORONARY ANGIOGRAPHY N/A 05/14/2020   Procedure: LEFT HEART CATH AND CORONARY ANGIOGRAPHY;  Surgeon: 05/18/2020, MD;  Location: [facility]  INVASIVE CV LAB;  Service: Cardiovascular;  Laterality: N/A;   LEFT HEART CATH AND CORONARY ANGIOGRAPHY N/A 05/16/2020   Procedure: LEFT HEART CATH AND CORONARY ANGIOGRAPHY;  Surgeon: 05/16/2020, MD;  Location: [facility] INVASIVE CV LAB;  Service: Cardiovascular;  Laterality: N/A;   RIB PLATING N/A 01/06/2021   Procedure: STERNAL PLATING;  Surgeon: 05/18/2020, MD;  Location: [facility] OR;  Service: Thoracic;  Laterality: N/A;   SHOULDER SURGERY     TEE WITHOUT CARDIOVERSION N/A 05/21/2020   Procedure: TRANSESOPHAGEAL ECHOCARDIOGRAM (TEE);  Surgeon: 01/08/2021, MD;  Location: [facility] OR;  Service: Open Heart Surgery;  Laterality: N/A;    Social History   Tobacco Use  Smoking Status Every Day   Packs/day: 0.25   Years: 42.00  Pack years: 10.50   Types: Cigarettes  Smokeless Tobacco Never  Tobacco Comments   1-2 cigarettes "every now and then"    Social History   Substance and Sexual Activity  Alcohol Use No     Allergies  Allergen Reactions   Dilaudid [Hydromorphone Hcl] Shortness Of Breath    No current facility-administered medications for this encounter.   Current Outpatient Medications  Medication Sig Dispense Refill   aspirin EC 81 MG tablet Take 1 tablet (81 mg total) by once mouth daily. Swallow whole. 90 tablet 1   atorvastatin (LIPITOR) 80 MG tablet Take 1 tablet (80 mg total) by mouth once daily. 90 tablet 1   benzonatate (TESSALON) 100 MG capsule Take 100-300 mg by mouth daily as needed for cough.     carvedilol (COREG) 6.25 MG tablet Take 1 tablet (6.25 mg total) by mouth 2 (two) times daily. 180 tablet 1   cephALEXin (KEFLEX) 500 MG capsule Take 1 capsule (500 mg total) by mouth 4 (four) times daily for 7 days. 28 capsule 0   furosemide (LASIX) 20 MG tablet Take 1 tablet (20 mg total) by mouth once daily. 180 tablet 1   ibuprofen (ADVIL) 200 MG tablet Take 800 mg by mouth every 6 (six) hours as needed for mild pain.     losartan (COZAAR) 25 MG tablet  Take 1 tablet (25 mg total) by mouth once daily. 90 tablet 1   Oxycodone HCl 10 MG TABS Take 1 tablet (10 mg total) by mouth every 6 (six) hours as needed for up to 5 days. 20 tablet 0   oxyCODONE-acetaminophen (PERCOCET/ROXICET) 5-325 MG tablet Take 1 tablet by mouth 3 (three) times daily as needed for pain.      (Not in a hospital admission)   Family History  Problem Relation Age of Onset   Heart attack Father      Review of Systems:   Review of Systems  Constitutional:  Negative for chills, fever and weight loss.  Respiratory:  Negative for shortness of breath.   Cardiovascular:  Positive for chest pain. Negative for leg swelling.  Gastrointestinal:  Negative for abdominal pain, heartburn, nausea and vomiting.  Pertinent items are noted in HPI.     Physical Exam: BP 128/81   Pulse 66   Temp 98.1 F (36.7 C)   Resp 18   Ht 5\' 6"  (1.676 m)   Wt 112.9 kg   SpO2 97%   BMI 40.19 kg/m    General appearance: alert, cooperative, and no distress Resp: clear to auscultation bilaterally Cardio: regular rate and rhythm, S1, S2 normal, no murmur, click, rub or gallop GI: soft, non-tender; bowel sounds normal; no masses,  no organomegaly Extremities: extremities normal, atraumatic, no cyanosis or edema Neurologic: Grossly normal Wound: Small amount of serous drainage from the lower portion of his incision. Sternum is stable to palpation without clicking or popping  Diagnostic Studies & Laboratory data:     Recent Radiology Findings:   DG Chest 2 View  Result Date: 01/15/2021 CLINICAL DATA:  Chest pain. EXAM: CHEST - 2 VIEW COMPARISON:  Chest radiograph dated 11/22/2020. FINDINGS: The lungs are clear. There is no pleural effusion pneumothorax. The cardiac silhouette is within limits. Median sternotomy wires and CABG vascular clips. No acute osseous pathology. IMPRESSION: No active cardiopulmonary disease. Electronically Signed   By: 01/23/2021 M.D.   On: 01/15/2021 01:24    CT Chest Wo Contrast  Result Date: 01/15/2021 CLINICAL DATA:  55 year old male with  history of chest wall pain. EXAM: CT CHEST WITHOUT CONTRAST TECHNIQUE: Multidetector CT imaging of the chest was performed following the standard protocol without IV contrast. COMPARISON:  Chest CT 12/17/2020. FINDINGS: Cardiovascular: Heart size is normal. Small amount of anterior pericardial fluid and/or thickening immediately deep to the sternum, unlikely to be of any hemodynamic significance at this time. No pericardial calcification. There is aortic atherosclerosis, as well as atherosclerosis of the great vessels of the mediastinum and the coronary arteries, including calcified atherosclerotic plaque in the left main, left anterior descending, left circumflex and right coronary arteries. Status post median sternotomy for CABG including LIMA to the LAD. Mediastinum/Nodes: In the inferior aspect of the anterior mediastinum (axial image 92 of series 3) just deep to the sternum, there is a poorly defined low-attenuation collection of material with surrounding inflammatory changes measuring approximately 6.3 x 3.3 cm. No pathologically enlarged mediastinal or hilar lymph nodes. Please note that accurate exclusion of hilar adenopathy is limited on noncontrast CT scans. Esophagus is unremarkable in appearance. No axillary lymphadenopathy. Lungs/Pleura: No acute consolidative airspace disease. No pleural effusions. No suspicious appearing pulmonary nodules or masses are noted. Upper Abdomen: Tiny noncalcified gallstone lying dependently in the gallbladder. Adreniform thickening of the left adrenal gland, likely reflective of adrenal hyperplasia. Musculoskeletal: Median sternotomy wires are noted. Healing sternotomy wound, closely approximated superiorly. The inferior aspect of the sternotomy wound does not approximate, measuring up to 1.7 cm in width (axial image 82 of series 3), which is increased compared to the prior  examination. There are several new fixation screws in both sides of the sternum with a faintly visualized fixation plate device hardware (KLS plate). Extensive surrounding inflammation and small volume of adjacent fluid noted associated with the sternal wound, most evident inferiorly (discussed above) in the anterior mediastinum. There are no aggressive appearing lytic or blastic lesions noted in the visualized portions of the skeleton. IMPRESSION: 1. Postoperative changes related to recent KLS plating of the inferior aspect of the sternum. There is extensive inflammation and a small amount of postoperative fluid associated with this healing wound, most evident inferiorly extending into the anterior mediastinum. No well-defined abscess is confidently identified at this time, however, the inferior aspect of the wound is concerning. This likely represents resolving postoperative seroma/hematoma, but follow-up imaging may be warranted should the patient exhibit signs or symptoms of worsening infection. No other potentially acute findings are appreciated on today's noncontrast CT examination. 2. Aortic atherosclerosis, in addition to left main and 3 vessel coronary artery disease. Status post median sternotomy for CABG including LIMA to the LAD. 3. Cholelithiasis. Aortic Atherosclerosis (ICD10-I70.0). Electronically Signed   By: Trudie Reed M.D.   On: 01/15/2021 06:06     I have independently reviewed the above radiologic studies and discussed with the patient   Recent Lab Findings: Lab Results  Component Value Date   WBC 10.6 (H) 01/15/2021   HGB 13.7 01/15/2021   HCT 40.8 01/15/2021   PLT 289 01/15/2021   GLUCOSE 129 (H) 01/15/2021   CHOL 168 12/27/2020   TRIG 309 (H) 12/27/2020   HDL 37 (L) 12/27/2020   LDLDIRECT 139.3 (H) 10/04/2020   LDLCALC 69 12/27/2020   ALT 23 01/02/2021   AST 24 01/02/2021   NA 141 01/15/2021   K 3.6 01/15/2021   CL 109 01/15/2021   CREATININE 0.86 01/15/2021   BUN  9 01/15/2021   CO2 25 01/15/2021   TSH 0.791 10/03/2020   INR 1.0 01/02/2021   HGBA1C 6.0 (  H) 12/27/2020      Assessment / Plan:      Sternal incision edema, seroma vs. Hematoma-small amount of drainage on exam. Does not look infected. Stable sternum to palpation. CT scan listed above Chronic pain with a hx of polysubstance abuse Tobacco abuse-cessation advised.   Plan: Admit the patient for further work-up. No plans at the moment to go to the OR. Dr. Cliffton AstersLightfoot to see later today.   I  spent 20 minutes counseling the patient face to face.   Jari Favreessa Conte, PA-C 01/15/2021 8:36 AM   Agree with above Likely seroma. Drug seeking behaviors Will admit for obs, and wound vac placement.  Barb Shear Keane Scrape Ronnell Clinger

## 2021-01-16 ENCOUNTER — Telehealth: Payer: Self-pay | Admitting: Licensed Clinical Social Worker

## 2021-01-16 ENCOUNTER — Inpatient Hospital Stay (HOSPITAL_COMMUNITY): Payer: Medicaid Other

## 2021-01-16 LAB — CBC
HCT: 41.5 % (ref 39.0–52.0)
Hemoglobin: 13.8 g/dL (ref 13.0–17.0)
MCH: 32.5 pg (ref 26.0–34.0)
MCHC: 33.3 g/dL (ref 30.0–36.0)
MCV: 97.6 fL (ref 80.0–100.0)
Platelets: 293 10*3/uL (ref 150–400)
RBC: 4.25 MIL/uL (ref 4.22–5.81)
RDW: 13.5 % (ref 11.5–15.5)
WBC: 12.3 10*3/uL — ABNORMAL HIGH (ref 4.0–10.5)
nRBC: 0 % (ref 0.0–0.2)

## 2021-01-16 LAB — BASIC METABOLIC PANEL
Anion gap: 8 (ref 5–15)
BUN: 12 mg/dL (ref 6–20)
CO2: 25 mmol/L (ref 22–32)
Calcium: 9.2 mg/dL (ref 8.9–10.3)
Chloride: 102 mmol/L (ref 98–111)
Creatinine, Ser: 1 mg/dL (ref 0.61–1.24)
GFR, Estimated: >60 mL/min (ref >60)
Glucose, Bld: 104 mg/dL — ABNORMAL HIGH (ref 70–99)
Potassium: 4.4 mmol/L (ref 3.5–5.1)
Sodium: 135 mmol/L (ref 135–145)

## 2021-01-16 MED ORDER — DOCUSATE SODIUM 100 MG PO CAPS
100.0000 mg | ORAL_CAPSULE | Freq: Every day | ORAL | Status: DC | PRN
  Administered 2021-01-16 – 2021-01-17 (×2): 100 mg via ORAL
  Filled 2021-01-16 (×2): qty 1

## 2021-01-16 NOTE — Progress Notes (Signed)
Pt c/o chest pain as per pt pain med not relieving pain md paged will continue to monitor

## 2021-01-16 NOTE — Progress Notes (Signed)
      [address]       [name] [address]             [phone]         Subjective: Feels okay, states his pain is not well controlled and he is asking for additional medication.   Objective: Vital signs in last 24 hours: Temp:  [98.3 F (36.8 C)-99.1 F (37.3 C)] 99.1 F (37.3 C) (08/25 0401) Pulse Rate:  [64-85] 67 (08/25 0401) Cardiac Rhythm: Normal sinus rhythm (08/25 0703) Resp:  [16-19] 19 (08/25 0401) BP: (121-141)/(63-91) 141/90 (08/25 0401) SpO2:  [93 %-100 %] 98 % (08/25 0401)     Intake/Output from previous day: 08/24 0701 - 08/25 0700 In: 603 [P.O.:600; I.V.:3] Out: 300 [Urine:300] Intake/Output this shift: No intake/output data recorded.  General appearance: alert, cooperative, and no distress Heart: regular rate and rhythm, S1, S2 normal, no murmur, click, rub or gallop Lungs: wheezes RUL Abdomen: soft, non-tender; bowel sounds normal; no masses,  no organomegaly Extremities: extremities normal, atraumatic, no cyanosis or edema Wound: clean and dry , some drainage from the superior aspect of the incision.   Lab Results: Recent Labs    01/15/21 1100 01/16/21 0400  WBC 10.4 12.3*  HGB 13.8 13.8  HCT 42.5 41.5  PLT 256 293   BMET:  Recent Labs    01/15/21 1100 01/16/21 0400  NA 141 135  K 3.9 4.4  CL 110 102  CO2 23 25  GLUCOSE 148* 104*  BUN 9 12  CREATININE 0.88 1.00  CALCIUM 8.8* 9.2    PT/INR: No results for input(s): LABPROT, INR in the last 72 hours. ABG    Component Value Date/Time   PHART 7.361 01/02/2021 1406   HCO3 22.2 01/02/2021 1406   TCO2 17 (L) 05/21/2020 1727   ACIDBASEDEF 2.4 (H) 01/02/2021 1406   O2SAT 97.2 01/02/2021 1406   CBG (last 3)  No results for input(s): GLUCAP in the last 72 hours.  Assessment/Plan: S/P prevena dressing placement  CV- NSR in the 60s, BP well controlled Pulm-creatinine 1.00, electrolytes okay H and H stable  WBC 12.3, no fever or chills-finishing up his Keflex   Sternal wound-prevena placed this morning, we will continue for 5 days. Some drainage from the superior aspect of his incision Pain is still not controlled on Oxy 10mg  q 4 hours. Will defer to attending if he wants to add any additional IV with history of chronic pain.  Plan: Continue dressing and monitoring labs. Drainage appears clear but will monitor. Encouraged ambulation     LOS: 1 day    01/16/2021

## 2021-01-16 NOTE — Progress Notes (Signed)
Patient has severe pain in in his chest.Given PRN medicine not helping. Patient also wants to see the MD in his room.Notified MD Tessa. Will continue monitor.

## 2021-01-16 NOTE — Telephone Encounter (Signed)
Outpatient LCSW received call from pt this morning [phone]). Pt shares he is still having discomfort he describes as "bad gas pains". He is positive overall about his plan of care and looking forward to going home again when ready. He shares he received a call yesterday from [facility] to initiate disability application but was in the process of being tx to floor. He has a scheduled phone call again with them today at 1pm.   Pt encouraged to call me back when discharged if any additional questions/concerns. Remain available as needed.   [name], MSW, LCSW [facility] Heart/Vascular Care Navigation  [phone]

## 2021-01-17 ENCOUNTER — Inpatient Hospital Stay (HOSPITAL_COMMUNITY): Payer: Medicaid Other

## 2021-01-17 ENCOUNTER — Telehealth: Payer: Self-pay | Admitting: Thoracic Surgery (Cardiothoracic Vascular Surgery)

## 2021-01-17 LAB — CBC
HCT: 43 % (ref 39.0–52.0)
Hemoglobin: 14.7 g/dL (ref 13.0–17.0)
MCH: 32.7 pg (ref 26.0–34.0)
MCHC: 34.2 g/dL (ref 30.0–36.0)
MCV: 95.6 fL (ref 80.0–100.0)
Platelets: 308 10*3/uL (ref 150–400)
RBC: 4.5 MIL/uL (ref 4.22–5.81)
RDW: 13.4 % (ref 11.5–15.5)
WBC: 19 10*3/uL — ABNORMAL HIGH (ref 4.0–10.5)
nRBC: 0 % (ref 0.0–0.2)

## 2021-01-17 MED ORDER — VANCOMYCIN HCL 1750 MG/350ML IV SOLN
1750.0000 mg | INTRAVENOUS | Status: DC
  Administered 2021-01-18 – 2021-01-19 (×2): 1750 mg via INTRAVENOUS
  Filled 2021-01-17 (×3): qty 350

## 2021-01-17 MED ORDER — BISACODYL 5 MG PO TBEC
5.0000 mg | DELAYED_RELEASE_TABLET | Freq: Every day | ORAL | Status: DC | PRN
  Administered 2021-01-17: 5 mg via ORAL
  Filled 2021-01-17: qty 1

## 2021-01-17 MED ORDER — IOHEXOL 9 MG/ML PO SOLN
ORAL | Status: AC
  Filled 2021-01-17: qty 1000

## 2021-01-17 MED ORDER — POLYETHYLENE GLYCOL 3350 17 G PO PACK
17.0000 g | PACK | Freq: Every day | ORAL | Status: DC
  Administered 2021-01-17 – 2021-01-19 (×3): 17 g via ORAL
  Filled 2021-01-17 (×4): qty 1

## 2021-01-17 MED ORDER — ACETAMINOPHEN 325 MG PO TABS
650.0000 mg | ORAL_TABLET | Freq: Four times a day (QID) | ORAL | Status: DC | PRN
  Administered 2021-01-17 – 2021-01-19 (×7): 650 mg via ORAL
  Filled 2021-01-17 (×7): qty 2

## 2021-01-17 MED ORDER — PIPERACILLIN-TAZOBACTAM 3.375 G IVPB: 3.3750 g | Freq: Four times a day (QID) | INTRAVENOUS | Status: DC

## 2021-01-17 MED ORDER — PIPERACILLIN-TAZOBACTAM 3.375 G IVPB
3.3750 g | Freq: Three times a day (TID) | INTRAVENOUS | Status: DC
  Administered 2021-01-17 – 2021-01-20 (×9): 3.375 g via INTRAVENOUS
  Filled 2021-01-17 (×11): qty 50

## 2021-01-17 MED ORDER — VANCOMYCIN HCL 500 MG/100ML IV SOLN
500.0000 mg | Freq: Two times a day (BID) | INTRAVENOUS | Status: DC
  Administered 2021-01-17: 500 mg via INTRAVENOUS
  Filled 2021-01-17 (×2): qty 100

## 2021-01-17 MED ORDER — VANCOMYCIN HCL 1750 MG/350ML IV SOLN
1750.0000 mg | Freq: Once | INTRAVENOUS | Status: AC
  Administered 2021-01-17: 1750 mg via INTRAVENOUS
  Filled 2021-01-17: qty 350

## 2021-01-17 MED ORDER — SIMETHICONE 80 MG PO CHEW
80.0000 mg | CHEWABLE_TABLET | Freq: Four times a day (QID) | ORAL | Status: DC | PRN
  Administered 2021-01-17 – 2021-01-18 (×3): 80 mg via ORAL
  Filled 2021-01-17 (×5): qty 1

## 2021-01-17 MED ORDER — LIDOCAINE 5 % EX PTCH
1.0000 | MEDICATED_PATCH | CUTANEOUS | Status: DC
  Administered 2021-01-18: 1 via TRANSDERMAL
  Filled 2021-01-17: qty 1

## 2021-01-17 MED ORDER — ALUM & MAG HYDROXIDE-SIMETH 200-200-20 MG/5ML PO SUSP
15.0000 mL | ORAL | Status: DC | PRN
  Administered 2021-01-17: 15 mL via ORAL
  Filled 2021-01-17: qty 30

## 2021-01-17 NOTE — Progress Notes (Signed)
RN attempted IV 

## 2021-01-17 NOTE — Progress Notes (Addendum)
      [address]       [place] [address]             [phone]         Subjective: Lots of abdominal pain this morning, fever overnight with increased WBC this morning.   Objective: Vital signs in last 24 hours: Temp:  [98.9 F (37.2 C)-101.2 F (38.4 C)] 98.9 F (37.2 C) (08/26 0500) Pulse Rate:  [57-88] 81 (08/26 0500) Cardiac Rhythm: Normal sinus rhythm (08/26 0700) Resp:  [18-20] 20 (08/26 0500) BP: (129-149)/(71-87) 137/87 (08/26 0500) SpO2:  [96 %-99 %] 96 % (08/26 0500)     Intake/Output from previous day: 08/25 0701 - 08/26 0700 In: 480 [P.Matthew.:480] Out: -  Intake/Output this shift: No intake/output data recorded.  General appearance: alert, cooperative, and mild distress Heart: regular rate and rhythm, S1, S2 normal, no murmur, click, rub or gallop Lungs: clear to auscultation bilaterally Abdomen: distended, tender Wound: prevena in place   Lab Results: Recent Labs    01/16/21 0400 01/17/21 0223  WBC 12.3* 19.0*  HGB 13.8 14.7  HCT 41.5 43.0  PLT 293 308   BMET:  Recent Labs    01/15/21 1100 01/16/21 0400  NA 141 135  K 3.9 4.4  CL 110 102  CO2 23 25  GLUCOSE 148* 104*  BUN 9 12  CREATININE 0.88 1.00  CALCIUM 8.8* 9.2    PT/INR: No results for input(s): LABPROT, INR in the last 72 hours. ABG    Component Value Date/Time   PHART 7.361 01/02/2021 1406   HCO3 22.2 01/02/2021 1406   TCO2 17 (L) 05/21/2020 1727   ACIDBASEDEF 2.4 (H) 01/02/2021 1406   O2SAT 97.2 01/02/2021 1406   CBG (last 3)  No results for input(s): GLUCAP in the last 72 hours.  Assessment/Plan: S/p prevena dressing placement  CV- NSR in the 60s, BP well controlled Pulm-creatinine 1.00, electrolytes okay H and H stable  WBC 19.0, Tmax 101.2  Sternal wound-prevena with good seal. We will continue for 5 days.  Pain is still not controlled on Oxy 10mg  q 4 hours.  Constipation-severe gas pain per the patient. Miralax and gasX ordered. Patient is  open to a suppository if needed. KUB pending.   Plan: KUB, laxatives, and ambulation recommended. Will order CT scan of chest and abdomen per Dr. . Patient made NPO.    LOS: 2 days    Matthew Tapia 01/17/2021  Complains of gas pain Wbc up Febrile overnight Ordered CT chest and abd  Matthew Tapia Matthew Tapia

## 2021-01-17 NOTE — Progress Notes (Signed)
Report Given to [place], [place] on [id number]. All assessments remained unchanged and patient transported to 4E26.

## 2021-01-17 NOTE — Progress Notes (Signed)
Patient has running zosyn from the existing peripheral iv. Patient need  another iv assess to start the vancomycin. RN attempted once but not success. Iv team consulted and waiting for the another new iv assess.

## 2021-01-17 NOTE — Discharge Summary (Signed)
[address]       [place] [address]             [phone]       Physician Discharge Summary  Patient ID: Matthew Tapia MRN: [id number] DOB/AGE: 1965/09/08 55 y.o.  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   Lactic acidosis 10/04/2020   Dehydration 10/04/2020   Chronic combined systolic and diastolic CHF (congestive heart failure) (HCC) 10/04/2020   Acute encephalopathy 10/03/2020   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   CAD (coronary artery disease) 05/17/2020   HFrEF (heart failure with reduced ejection fraction) (HCC)    CAD, 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   Polysubstance abuse (HCC) 05/16/2020   Cardiomyopathy, ischemic 05/16/2020   Hyperlipidemia LDL goal <70 05/16/2020   Essential hypertension 05/16/2020   Elevated hemoglobin A1c 05/16/2020   Acute ST elevation myocardial infarction (STEMI) of inferior wall (HCC) 05/14/2020   Tobacco abuse 05/14/2020   Leukocytosis 05/14/2020   Anxiety 05/14/2020   Cough 05/14/2020     Discharge Diagnoses:  Active Problems:   Sternal pain   Discharged Condition: good  History of Present Illness:         Matthew Tapia is a 55 year old male with past history of coronary artery disease status post coronary bypass grafting in March 2022.  His initial postoperative course was uneventful but several months later he had several episodes of heavy coughing and sneezing and subsequently developed increased pain in the lower portion of the sternum.  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  He underwent KLS plating of the inferior portion of the sternum by Dr. Cliffton Asters on 01/06/2021 and tolerated this procedure well.  On the day after surgery, he had a coughing episode while in the hospital saturating his chest dressing with mucoid  drainage.  He was started on IV Zosyn and vancomycin empirically.  A Prevena negative pressure dressing was placed over the incision.  He was observed in the hospital for another 24 hours and had no further drainage from the sternal incision.  He was discharged home on 01/09/2021 with a Prevena dressing in place and on oral Keflex. He returned to the office on 01/13/2021 for a wound check. Since hospital discharge the patient reported the Praveena apparatus stopped working 2 days ago so he removed it.  Since then, he has been doing dressing changes daily.  The dressing removed in the office today had been placed 3 hours before and  had just very minor staining on it.  He denies any fever or movement in his breastbone.  Overall, he felt much better than prior to surgery.  He has been taking oral Keflex as prescribed.   Matthew Tapia then called our office after hours last night stating that he was having worsening pain and experiencing a lot of sternal drainage. He also stated that he had some swelling on the right side of his incision. He was advised by Dr. Dorris Fetch to go to the ED for further evaluation. He states that after a forceful cough he experienced significant pain. He has had drainage especially from the lower portion of his sternal incision. He underwent a CT scan of the chest which showed: Extensive inflammation and a small amount of postoperative fluid associated with this healing wound, most evident inferiorly extending into  the anterior mediastinum.  No well-defined abscess is confidently identified at this time however the inferior aspect of the wound is concerning.  This likely represents resolving postoperative seroma/hematoma. He will be admitted for further workup.    Hospital Course:   We placed a Prevena dressing over the wound.  We continue to treat his chronic pain with oxycodone.  Day 2 of admission his white blood cell count increased to 19.0 and he had a T-max fever of 101.2.  He was  having severe gas pain at this time despite being started on Maalox overnight.  I gave him MiraLAX and Gas-X as well as ordered a KUB.  We also ordered a CT of the chest and abdomen for further work-up.  There were some technical difficulties obtaining this exam and it was subsequently canceled.  He did get an excellent response to lactulose with multiple bowel movements which have given him significant clinical relief.  He has defervesced and over time and white blood cell count is decreasing.  Patient is now in the normal range.  He has remained on vancomycin and Zosyn.  He will be converted to oral Keflex and appears to be stable for discharge at this time with Prevena VAC in place.  We will see him in the office on Friday for recheck or sooner as needed.   Consults: None  Significant Diagnostic Studies: routine CCR/labs  Treatments: provena VAC, IV abx, laxatives    Discharge Exam: Blood pressure (!) 104/52, pulse 68, temperature 98.9 F (37.2 C), temperature source Oral, resp. rate 16, height 5\' 6"  (1.676 m), weight 110.9 kg, SpO2 96 %.   General appearance: alert, cooperative, and no distress Heart: regular rate and rhythm Lungs: slight coarseness Abdomen: benign Wound: Provena in place Disposition:  Discharge disposition: 01-Home or Self Care      Discharge Instructions     Discharge patient   Complete by: As directed    Discharge disposition: 01-Home or Self Care   Discharge patient date: 01/20/2021      Allergies as of 01/20/2021       Reactions   Dilaudid [hydromorphone Hcl] Shortness Of Breath        Medication List     STOP taking these medications    benzonatate 100 MG capsule Commonly known as: TESSALON   oxyCODONE-acetaminophen 5-325 MG tablet Commonly known as: PERCOCET/ROXICET       TAKE these medications    Aspirin Adult Low Strength 81 MG EC tablet Generic drug: aspirin Take 1 tablet (81 mg total) by once mouth daily. Swallow whole.    atorvastatin 80 MG tablet Commonly known as: LIPITOR Take 1 tablet (80 mg total) by mouth once daily.   carvedilol 6.25 MG tablet Commonly known as: COREG Take 1 tablet (6.25 mg total) by mouth 2 (two) times daily.   cephALEXin 500 MG capsule Commonly known as: KEFLEX Take 1 capsule (500 mg total) by mouth 3 (three) times daily for 7 days. What changed: when to take this   furosemide 20 MG tablet Commonly known as: LASIX Take 1 tablet (20 mg total) by mouth once daily.   ibuprofen 200 MG tablet Commonly known as: ADVIL Take 4 tablets (800 mg total) by mouth every 8 (eight) hours as needed for mild pain or moderate pain. What changed:  when to take this reasons to take this   losartan 25 MG tablet Commonly known as: COZAAR Take 1 tablet (25 mg total) by mouth once daily.   nicotine 14  mg/24hr patch Commonly known as: NICODERM CQ - dosed in mg/24 hours Place 1 patch (14 mg total) onto the skin daily.   Oxycodone HCl 10 MG Tabs Take 1 tablet (10 mg total) by mouth every 6 (six) hours as needed for severe pain. What changed: reasons to take this   polyethylene glycol 17 g packet Commonly known as: MIRALAX / GLYCOLAX Take 17 g by mouth daily.        Follow-up Information     Miki Kins, FNP. Call today.   Specialty: Family Medicine Contact information: [address] [place] [address] [phone]         Iran Ouch, MD .   Specialty: Cardiology Contact information: [address] [place] [address] [phone]                 Signed: Rowe Clack PA-C 01/20/2021, 8:14 AM

## 2021-01-17 NOTE — Progress Notes (Signed)
Pharmacy Antibiotic Note  Matthew Tapia is a 55 y.o. male admitted on 01/15/2021; he has hx of CAD, S/P CABG in March 22 with non-sternal union, for which he underwent KLS plating of inferior portion of sternum on 01/06/21. Pt  has had drainage from sternal incision; CT showed extensive inflammation and small amount of postop fluid collection associated with healing wound. Pharmacy has been consulted for vancomycin dosing for wound infection.  H/H 14.7/43.0, plt 308; Scr 1, CrCl 97. 3 ml/min (Scr slightly increased from admission earlier this month). Pt has rec'd vancomycin 500 mg IV X 1 at ~1500 this afternoon.  Plan: Give additional vancomycin 1750 mg IV X 1 to make total dose of 2250 mg IV today, then vancomycin 1750 mg IV Q 24 hrs (estimated vancomycin AUC: 468.4, goal AUC: 400-550) Monitor WBC, temp, clinical improvement, renal function, vancomycin levels as indicated  Height: 5\' 6"  (167.6 cm) Weight: 112.9 kg (249 lb) IBW/kg (Calculated) : 63.8  Temp (24hrs), Avg:99.7 F (37.6 C), Min:98.9 F (37.2 C), Max:101.2 F (38.4 C)  Recent Labs  Lab 01/15/21 0056 01/15/21 1100 01/16/21 0400 01/17/21 0223  WBC 10.6* 10.4 12.3* 19.0*  CREATININE 0.86 0.88 1.00  --     Estimated Creatinine Clearance: 97.3 mL/min (by C-G formula based on SCr of 1 mg/dL).    Allergies  Allergen Reactions   Dilaudid [Hydromorphone Hcl] Shortness Of Breath    Antimicrobials this admission: Keflex 8/24 >> 8/25 Zosyn 8/26 >> Vancomycin 8/26 >>  Microbiology results: 8/11 COVID: negative 8/11 MRSA PCR: negative 8/11 Staph aureus nasal swab: positive  Thank you for allowing pharmacy to be a part of this patient's care.  10/11, PharmD, BCPS, [facility] Clinical Pharmacist 01/17/2021 4:48 PM

## 2021-01-17 NOTE — Hospital Course (Addendum)
History of Present Illness:         [name] is a 55 year old male with past history of coronary artery disease status post coronary bypass grafting in March 2022.  His initial postoperative course was uneventful but several months later he had several episodes of heavy coughing and sneezing and subsequently developed increased pain in the lower portion of the sternum.  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  He underwent KLS plating of the inferior portion of the sternum by Dr. Cliffton Asters on 01/06/2021 and tolerated this procedure well.  On the day after surgery, he had a coughing episode while in the hospital saturating his chest dressing with mucoid drainage.  He was started on IV Zosyn and vancomycin empirically.  A Prevena negative pressure dressing was placed over the incision.  He was observed in the hospital for another 24 hours and had no further drainage from the sternal incision.  He was discharged home on 01/09/2021 with a Prevena dressing in place and on oral Keflex. He returned to the office on 01/13/2021 for a wound check. Since hospital discharge the patient reported the Praveena apparatus stopped working 2 days ago so he removed it.  Since then, he has been doing dressing changes daily.  The dressing removed in the office today had been placed 3 hours before and  had just very minor staining on it.  He denies any fever or movement in his breastbone.  Overall, he felt much better than prior to surgery.  He has been taking oral Keflex as prescribed.   [name] then called our office after hours last night stating that he was having worsening pain and experiencing a lot of sternal drainage. He also stated that he had some swelling on the right side of his incision. He was advised by Dr. Dorris Fetch to go to the ED for further evaluation. He states that after a forceful cough he experienced significant pain. He has had drainage especially from the lower  portion of his sternal incision. He underwent a CT scan of the chest which showed: Extensive inflammation and a small amount of postoperative fluid associated with this healing wound, most evident inferiorly extending into the anterior mediastinum.  No well-defined abscess is confidently identified at this time however the inferior aspect of the wound is concerning.  This likely represents resolving postoperative seroma/hematoma. He will be admitted for further workup.    Hospital Course:   We placed a Prevena dressing over the wound.  We continue to treat his chronic pain with oxycodone.  Day 2 of admission his white blood cell count increased to 19.0 and he had a T-max fever of 101.2.  He was having severe gas pain at this time despite being started on Maalox overnight.  I gave him MiraLAX and Gas-X as well as ordered a KUB.  We also ordered a CT of the chest and abdomen for further work-up.  There were some technical difficulties obtaining this exam and it was subsequently canceled.  He did get an excellent response to lactulose with multiple bowel movements which have given him significant clinical relief.  He has defervesced and over time and white blood cell count is decreasing.  Patient is now in the normal range.  He has remained on vancomycin and Zosyn.  He will be converted to oral Keflex and appears to be stable for discharge at this time with Prevena VAC in place.  We will see him in the office  on Friday for recheck or sooner as needed.

## 2021-01-18 LAB — BASIC METABOLIC PANEL
Anion gap: 13 (ref 5–15)
BUN: 15 mg/dL (ref 6–20)
CO2: 19 mmol/L — ABNORMAL LOW (ref 22–32)
Calcium: 9.3 mg/dL (ref 8.9–10.3)
Chloride: 101 mmol/L (ref 98–111)
Creatinine, Ser: 1.06 mg/dL (ref 0.61–1.24)
GFR, Estimated: >60 mL/min (ref >60)
Glucose, Bld: 152 mg/dL — ABNORMAL HIGH (ref 70–99)
Potassium: 4.2 mmol/L (ref 3.5–5.1)
Sodium: 133 mmol/L — ABNORMAL LOW (ref 135–145)

## 2021-01-18 MED ORDER — FLEET ENEMA 7-19 GM/118ML RE ENEM: 1.0000 | ENEMA | Freq: Every day | RECTAL | Status: DC | PRN

## 2021-01-18 MED ORDER — LACTULOSE 10 GM/15ML PO SOLN
30.0000 g | Freq: Two times a day (BID) | ORAL | Status: DC | PRN
  Administered 2021-01-18: 30 g via ORAL
  Filled 2021-01-18: qty 45

## 2021-01-18 NOTE — Progress Notes (Addendum)
[address]       [name] [address]             [phone]         Subjective: C/o abdominal discomfort  Objective: Vital signs in last 24 hours: Temp:  [97.7 F (36.5 C)-99.4 F (37.4 C)] 98.6 F (37 C) (08/27 0729) Pulse Rate:  [79-92] 79 (08/27 0729) Cardiac Rhythm: Normal sinus rhythm (08/26 2054) Resp:  [16-20] 17 (08/27 0729) BP: (108-157)/(63-119) 131/82 (08/27 0729) SpO2:  [91 %-99 %] 97 % (08/27 0729)  Hemodynamic parameters for last 24 hours:    Intake/Output from previous day: 08/26 0701 - 08/27 0700 In: 200 [IV Piggyback:200] Out: 1 [Stool:1] Intake/Output this shift: No intake/output data recorded.  General appearance: alert, cooperative, distracted, and mild distress Heart: regular rate and rhythm Lungs: clear to auscultation bilaterally Abdomen: non tender, + BS, + distension/obese Extremities: + edema Wound: provena in place  Lab Results: Recent Labs    01/16/21 0400 01/17/21 0223  WBC 12.3* 19.0*  HGB 13.8 14.7  HCT 41.5 43.0  PLT 293 308   BMET:  Recent Labs    01/15/21 1100 01/16/21 0400  NA 141 135  K 3.9 4.4  CL 110 102  CO2 23 25  GLUCOSE 148* 104*  BUN 9 12  CREATININE 0.88 1.00  CALCIUM 8.8* 9.2    PT/INR: No results for input(s): LABPROT, INR in the last 72 hours. ABG    Component Value Date/Time   PHART 7.361 01/02/2021 1406   HCO3 22.2 01/02/2021 1406   TCO2 17 (L) 05/21/2020 1727   ACIDBASEDEF 2.4 (H) 01/02/2021 1406   O2SAT 97.2 01/02/2021 1406   CBG (last 3)  No results for input(s): GLUCAP in the last 72 hours.  Meds Scheduled Meds:  aspirin EC  81 mg Oral Daily   atorvastatin  80 mg Oral Daily   carvedilol  6.25 mg Oral BID   enoxaparin (LOVENOX) injection  40 mg Subcutaneous Q24H   furosemide  20 mg Oral Daily   lidocaine  1 patch Transdermal Q24H   losartan  25 mg Oral Daily   nicotine  14 mg Transdermal Daily   polyethylene glycol  17 g Oral Daily   sodium chloride  flush  3 mL Intravenous Q12H   Continuous Infusions:  piperacillin-tazobactam (ZOSYN)  IV Stopped (01/18/21 0335)   vancomycin     PRN Meds:.acetaminophen, benzonatate, bisacodyl, docusate sodium, oxyCODONE, simethicone  Xrays DG Abd 1 View  Result Date: 01/17/2021 CLINICAL DATA:  Abdominal pain. EXAM: ABDOMEN - 1 VIEW COMPARISON:  None. FINDINGS: The bowel gas pattern is normal. Retained contrast is noted within the urinary bladder and within multiple bowel loops within the right lower quadrant. No radio-opaque calculi are seen. IMPRESSION: Normal bowel gas pattern. Electronically Signed   By: Aram Candela M.D.   On: 01/17/2021 17:56    Assessment/Plan:  1 afeb, Tmax 99.4, sinus rhythm- c/o significant discomfort from constipation 2 sats good on RA 3 abd XRAY unremarkable, CT results not available d/t scanner problem 4 Zosyn and Vanco  5 will repeat CBC in am 6 will give lactulose this am, national back order of mag citrate, enema as well    LOS: 3 days    Rowe Clack PA-C Pager [phone] 01/18/2021    Chart reviewed, patient examined, agree with above. He had 3 BM's this morning shortly after lactulose and says he feels 100 times better. All of  his pain is gone.  Low grade fever but WBC increasing. Continue antibiotics. CT chest cancelled for some reason. I think we can hold off for now since he feels much better.

## 2021-01-18 NOTE — Progress Notes (Signed)
Patient had large BM today after receiving Lactulose. States he feels a lot of relief. Brynda Rim, RN

## 2021-01-18 NOTE — Progress Notes (Signed)
Received pt from [facility]. VSS. Prevena vac functioning. 9/10 "gas" pain in  upper abdomen. Informed he would receive his meds once they were due and he was agreeable. Oriented to room and call light. Pt notified family of his transfer. Will continue to monitor.

## 2021-01-19 LAB — CBC
HCT: 39.3 % (ref 39.0–52.0)
Hemoglobin: 13.2 g/dL (ref 13.0–17.0)
MCH: 32.6 pg (ref 26.0–34.0)
MCHC: 33.6 g/dL (ref 30.0–36.0)
MCV: 97 fL (ref 80.0–100.0)
Platelets: 241 10*3/uL (ref 150–400)
RBC: 4.05 MIL/uL — ABNORMAL LOW (ref 4.22–5.81)
RDW: 13.2 % (ref 11.5–15.5)
WBC: 12.8 10*3/uL — ABNORMAL HIGH (ref 4.0–10.5)
nRBC: 0 % (ref 0.0–0.2)

## 2021-01-19 NOTE — Progress Notes (Addendum)
[address]       [name] [address]             [phone]         Subjective: Feels dramatically better with mult BM's   Objective: Vital signs in last 24 hours: Temp:  [98.3 F (36.8 C)-99.8 F (37.7 C)] 98.8 F (37.1 C) (08/28 0741) Pulse Rate:  [81-87] 87 (08/28 0741) Cardiac Rhythm: Normal sinus rhythm (08/28 0700) Resp:  [18-21] 19 (08/28 0741) BP: (103-136)/(63-89) 136/89 (08/28 0741) SpO2:  [95 %-98 %] 97 % (08/28 0741) Weight:  [108.6 kg] 108.6 kg (08/28 0513)  Hemodynamic parameters for last 24 hours:    Intake/Output from previous day: 08/27 0701 - 08/28 0700 In: 883.9 [P.O.:440; IV Piggyback:443.9] Out: -  Intake/Output this shift: No intake/output data recorded.  General appearance: alert, cooperative, and no distress Heart: regular rate and rhythm Lungs: clear to auscultation bilaterally Abdomen: less disension, non-tender Wound: Provena in place  Lab Results: Recent Labs    01/17/21 0223 01/19/21 0117  WBC 19.0* 12.8*  HGB 14.7 13.2  HCT 43.0 39.3  PLT 308 241   BMET:  Recent Labs    01/18/21 0935  NA 133*  K 4.2  CL 101  CO2 19*  GLUCOSE 152*  BUN 15  CREATININE 1.06  CALCIUM 9.3    PT/INR: No results for input(s): LABPROT, INR in the last 72 hours. ABG    Component Value Date/Time   PHART 7.361 01/02/2021 1406   HCO3 22.2 01/02/2021 1406   TCO2 17 (L) 05/21/2020 1727   ACIDBASEDEF 2.4 (H) 01/02/2021 1406   O2SAT 97.2 01/02/2021 1406   CBG (last 3)  No results for input(s): GLUCAP in the last 72 hours.  Meds Scheduled Meds:  aspirin EC  81 mg Oral Daily   atorvastatin  80 mg Oral Daily   carvedilol  6.25 mg Oral BID   enoxaparin (LOVENOX) injection  40 mg Subcutaneous Q24H   furosemide  20 mg Oral Daily   lidocaine  1 patch Transdermal Q24H   losartan  25 mg Oral Daily   nicotine  14 mg Transdermal Daily   polyethylene glycol  17 g Oral Daily   sodium chloride flush  3 mL Intravenous Q12H    Continuous Infusions:  piperacillin-tazobactam (ZOSYN)  IV 3.375 g (01/19/21 0815)   vancomycin 1,750 mg (01/18/21 2113)   PRN Meds:.acetaminophen, benzonatate, bisacodyl, docusate sodium, lactulose, oxyCODONE, simethicone, sodium phosphate  Xrays DG Abd 1 View  Result Date: 01/17/2021 CLINICAL DATA:  Abdominal pain. EXAM: ABDOMEN - 1 VIEW COMPARISON:  None. FINDINGS: The bowel gas pattern is normal. Retained contrast is noted within the urinary bladder and within multiple bowel loops within the right lower quadrant. No radio-opaque calculi are seen. IMPRESSION: Normal bowel gas pattern. Electronically Signed   By: Aram Candela M.D.   On: 01/17/2021 17:56    Results for orders placed or performed during the hospital encounter of 01/02/21  Surgical pcr screen     Status: Abnormal   Collection Time: 01/02/21  1:46 PM   Specimen: Nasal Mucosa; Nasal Swab  Result Value Ref Range Status   MRSA, PCR NEGATIVE NEGATIVE Final   Staphylococcus aureus POSITIVE (A) NEGATIVE Final    Comment: (NOTE) The Xpert SA Assay (FDA approved for NASAL specimens in patients 13 years of age and older), is one component of a comprehensive surveillance program. It is not intended to diagnose infection nor to guide  or monitor treatment. Performed at [facility], [address] [place], [place] [address]   SARS CORONAVIRUS 2 (TAT 6-24 HRS) Nasopharyngeal Nasopharyngeal Swab     Status: None   Collection Time: 01/02/21  1:47 PM   Specimen: Nasopharyngeal Swab  Result Value Ref Range Status   SARS Coronavirus 2 NEGATIVE NEGATIVE Final    Comment: (NOTE) SARS-CoV-2 target nucleic acids are NOT DETECTED.  The SARS-CoV-2 RNA is generally detectable in upper and lower respiratory specimens during the acute phase of infection. Negative results do not preclude SARS-CoV-2 infection, do not rule out co-infections with other pathogens, and should not be used as the sole basis for treatment or other  patient management decisions. Negative results must be combined with clinical observations, patient history, and epidemiological information. The expected result is Negative.  Fact Sheet for Patients: HairSlick.no  Fact Sheet for Healthcare Providers: quierodirigir.com  This test is not yet approved or cleared by the [place] FDA and  has been authorized for detection and/or diagnosis of SARS-CoV-2 by FDA under an Emergency Use Authorization (EUA). This EUA will remain  in effect (meaning this test can be used) for the duration of the COVID-19 declaration under Se ction 564(b)(1) of the Act, 21 U.S.C. section 360bbb-3(b)(1), unless the authorization is terminated or revoked sooner.  Performed at [facility], [address], [place], [place] [address]        Assessment/Plan:  1 tmax 99.8, VSS, sr with PVC's, cont current beta blocker dose, repeat labs in am 2 sats good on RA 3 UOP not recorded 4 leukocytosis conts to improve- now 12.8 5 H/H fairly stable but lower than 8/26 6 on vanc/zosyn- poss transition to po abx soon v stop 7 poss home in am      LOS: 4 days    Rowe Clack PA-C Pager [phone] 01/19/2021    Chart reviewed, patient examined, agree with above. Feels well. No pain. Leukocytosis improving. Tmax 99.8. Continue antibiotics.

## 2021-01-19 NOTE — Plan of Care (Signed)

## 2021-01-20 ENCOUNTER — Other Ambulatory Visit: Payer: Self-pay

## 2021-01-20 ENCOUNTER — Telehealth: Payer: Self-pay | Admitting: Licensed Clinical Social Worker

## 2021-01-20 LAB — BASIC METABOLIC PANEL
Anion gap: 8 (ref 5–15)
BUN: 13 mg/dL (ref 6–20)
CO2: 26 mmol/L (ref 22–32)
Calcium: 9.2 mg/dL (ref 8.9–10.3)
Chloride: 102 mmol/L (ref 98–111)
Creatinine, Ser: 0.9 mg/dL (ref 0.61–1.24)
GFR, Estimated: >60 mL/min (ref >60)
Glucose, Bld: 103 mg/dL — ABNORMAL HIGH (ref 70–99)
Potassium: 4.1 mmol/L (ref 3.5–5.1)
Sodium: 136 mmol/L (ref 135–145)

## 2021-01-20 LAB — CBC
HCT: 40.1 % (ref 39.0–52.0)
Hemoglobin: 13.2 g/dL (ref 13.0–17.0)
MCH: 32.3 pg (ref 26.0–34.0)
MCHC: 32.9 g/dL (ref 30.0–36.0)
MCV: 98 fL (ref 80.0–100.0)
Platelets: 260 10*3/uL (ref 150–400)
RBC: 4.09 MIL/uL — ABNORMAL LOW (ref 4.22–5.81)
RDW: 13.1 % (ref 11.5–15.5)
WBC: 9.5 10*3/uL (ref 4.0–10.5)
nRBC: 0 % (ref 0.0–0.2)

## 2021-01-20 MED ORDER — CEPHALEXIN 500 MG PO CAPS
500.0000 mg | ORAL_CAPSULE | Freq: Every day | ORAL | 0 refills | Status: DC
Start: 2021-01-20 — End: 2022-03-31
  Filled 2021-01-20: qty 21, 7d supply, fill #0

## 2021-01-20 MED ORDER — IBUPROFEN 200 MG PO TABS: 800.0000 mg | ORAL_TABLET | Freq: Three times a day (TID) | ORAL | 0 refills | Status: DC | PRN

## 2021-01-20 MED ORDER — POLYETHYLENE GLYCOL 3350 17 G PO PACK: 17.0000 g | PACK | Freq: Every day | ORAL | 0 refills | Status: DC

## 2021-01-20 MED ORDER — NICOTINE 14 MG/24HR TD PT24: 14.0000 mg | MEDICATED_PATCH | Freq: Every day | TRANSDERMAL | 0 refills | Status: DC

## 2021-01-20 MED ORDER — OXYCODONE HCL 10 MG PO TABS: 10.0000 mg | ORAL_TABLET | Freq: Four times a day (QID) | ORAL | 0 refills | Status: AC | PRN

## 2021-01-20 MED ORDER — CEPHALEXIN 500 MG PO CAPS: 500.0000 mg | ORAL_CAPSULE | Freq: Three times a day (TID) | ORAL | 0 refills | Status: AC

## 2021-01-20 NOTE — Telephone Encounter (Signed)
LCSW received call from pt [phone]). He has been discharged, was able to get his medications sent over to the Medication Management clinic and is working on a ride. If he is unable to find one then he will let me know to ensure he can get his antibiotics. He will need a ride to his appointment on Friday w/ [facility] and I can send this in for him. He shares that he received his packet of paperwork from disability coordinators at [facility]. He completed them and received an envelope to return. He has called [name] as he wants to clarify that everything is done, but if he does not hear from her/ends up not mailing them then he was encouraged to bring them in to his appt Friday and I will pick them up and bring them to [facility]. No additional questions/concerns at this time.   Ride request sent to Transportation Services for Friday appt at [facility] office.  Remain available to assist pt as needed w/ any ongoing questions/concerns.   [name], MSW, LCSW [facility] Heart/Vascular Care Navigation  [phone]

## 2021-01-20 NOTE — Progress Notes (Signed)
      [address]       [name] [address]             [phone]         Subjective: Feels well  Objective: Vital signs in last 24 hours: Temp:  [98.3 F (36.8 C)-98.9 F (37.2 C)] 98.9 F (37.2 C) (08/29 0429) Pulse Rate:  [68-87] 68 (08/29 0429) Cardiac Rhythm: Normal sinus rhythm (08/28 1950) Resp:  [16-20] 16 (08/29 0429) BP: (104-136)/(52-89) 104/52 (08/29 0429) SpO2:  [96 %-98 %] 96 % (08/29 0429) Weight:  [110.9 kg] 110.9 kg (08/29 0330)  Hemodynamic parameters for last 24 hours:    Intake/Output from previous day: 08/28 0701 - 08/29 0700 In: 798.1 [P.O.:250; IV Piggyback:548.1] Out: -  Intake/Output this shift: No intake/output data recorded.  General appearance: alert, cooperative, and no distress Heart: regular rate and rhythm Lungs: slight coarseness Abdomen: benign Wound: Provena in place  Lab Results: Recent Labs    01/19/21 0117 01/20/21 0223  WBC 12.8* 9.5  HGB 13.2 13.2  HCT 39.3 40.1  PLT 241 260   BMET:  Recent Labs    01/18/21 0935 01/20/21 0223  NA 133* 136  K 4.2 4.1  CL 101 102  CO2 19* 26  GLUCOSE 152* 103*  BUN 15 13  CREATININE 1.06 0.90  CALCIUM 9.3 9.2    PT/INR: No results for input(s): LABPROT, INR in the last 72 hours. ABG    Component Value Date/Time   PHART 7.361 01/02/2021 1406   HCO3 22.2 01/02/2021 1406   TCO2 17 (L) 05/21/2020 1727   ACIDBASEDEF 2.4 (H) 01/02/2021 1406   O2SAT 97.2 01/02/2021 1406   CBG (last 3)  No results for input(s): GLUCAP in the last 72 hours.  Meds Scheduled Meds:  aspirin EC  81 mg Oral Daily   atorvastatin  80 mg Oral Daily   carvedilol  6.25 mg Oral BID   enoxaparin (LOVENOX) injection  40 mg Subcutaneous Q24H   furosemide  20 mg Oral Daily   lidocaine  1 patch Transdermal Q24H   losartan  25 mg Oral Daily   nicotine  14 mg Transdermal Daily   polyethylene glycol  17 g Oral Daily   sodium chloride flush  3 mL Intravenous Q12H   Continuous  Infusions:  piperacillin-tazobactam (ZOSYN)  IV Stopped (01/20/21 0427)   vancomycin Stopped (01/19/21 2338)   PRN Meds:.acetaminophen, benzonatate, bisacodyl, docusate sodium, lactulose, oxyCODONE, simethicone, sodium phosphate  Xrays No results found.  Assessment/Plan:  1 has defervesced, VSS 2 sats good on ra 3 no leukocytosis 4 normal renal fxn 5 not anemic 7 hyponatremia resolved 8 glucose pretty well controlled 9 home when off IV abx- will d/w MD       LOS: 5 days    Rowe Clack PA--C Pager [phone] 01/20/2021

## 2021-01-20 NOTE — Progress Notes (Signed)
Piv dcd. Site unremarkable.Vss. Ccmd made aware.patient states dr Cliffton Asters ordered to change dressing before going home. Dressing clean dry and intact. No orders. Natalie Primary RN will clarify wound care orders and instructions. Patient states his ride home will be here by 11;30.

## 2021-01-20 NOTE — Progress Notes (Signed)
Pt wound vac instructions were completed, Pt made aware of plan which is to get dressing removed on Friday with doctor.   Kalman Jewels, RN 01/20/2021 12:13 PM

## 2021-01-21 ENCOUNTER — Telehealth: Payer: Self-pay | Admitting: Licensed Clinical Social Worker

## 2021-01-21 ENCOUNTER — Other Ambulatory Visit: Payer: Self-pay

## 2021-01-21 NOTE — Telephone Encounter (Signed)
LCSW received a call from pt- he states that his Prevena vac was alarming and so he removed it. LCSW inquired if pt had called TCTS office yet at this time to let them know that and receive any next steps as my understanding was the vac was supposed to remain on until f/u visit Friday. Pt states he has called and is waiting on a call back.   He shares that he was able to get his medications picked up yesterday so he didn't miss any doses. He is aware of ride scheduled for appointment on Friday. He states if he is advised to go to TCTS office earlier he may need a ride. LCSW advised pt that he can call Transportation Services directly to schedule ride and does not need to go through LCSW unless there is an issue or urgent ride need. Pt states understanding.   He finally also wanted to let me know that he received a call back from [facility] and mailed the disability paperwork completed in delegated envelope. Pt did receive an additional piece of mail he wants to confirm if he needs to do anything with- he will send me pictures to my work phone and I will pass these on to [facility] via secure email message.   I reached out to Tommye Standard, RN, who confirmed pt will get a call from Darl Pikes w/ TCTS staff this afternoon for any further guidance and this was sent to pt.   Remain available as needed for any additional questions/concerns that may arise.   Matthew Tapia, MSW, LCSW [facility] Heart/Vascular Care Navigation  [phone]

## 2021-01-24 ENCOUNTER — Other Ambulatory Visit: Payer: Self-pay

## 2021-01-24 ENCOUNTER — Ambulatory Visit (INDEPENDENT_AMBULATORY_CARE_PROVIDER_SITE_OTHER): Payer: Self-pay | Admitting: Thoracic Surgery (Cardiothoracic Vascular Surgery)

## 2021-01-24 ENCOUNTER — Encounter: Payer: Self-pay | Admitting: Thoracic Surgery (Cardiothoracic Vascular Surgery)

## 2021-01-24 DIAGNOSIS — Z951 Presence of aortocoronary bypass graft: Secondary | ICD-10-CM

## 2021-01-24 NOTE — Progress Notes (Signed)
     [address]       [name] [address]             [phone]       Patient: Home Provider: Office Consent for Telemedicine visit obtained.  Today's visit was completed via a real-time telehealth (see specific modality noted below). The patient/authorized person provided oral consent at the time of the visit to engage in a telemedicine encounter with the present provider at [facility]. The patient/authorized person was informed of the potential benefits, limitations, and risks of telemedicine. The patient/authorized person expressed understanding that the laws that protect confidentiality also apply to telemedicine. The patient/authorized person acknowledged understanding that telemedicine does not provide emergency services and that he or she would need to call 911 or proceed to the nearest hospital for help if such a need arose.   Total time spent in the clinical discussion 10 minutes.  Telehealth Modality: Phone visit (audio only)  I had a telephone visit with Matthew Tapia.  He was originally schedule for an inperson appointment, but his wife tested positive for Covid.  His pain much improved.  The wound vac has already been removed.  The incision is healing well.  He will follow-up in 1 month with a cxr  Sherrian Nunnelley Keane Scrape

## 2021-01-31 ENCOUNTER — Other Ambulatory Visit: Payer: Self-pay | Admitting: Thoracic Surgery (Cardiothoracic Vascular Surgery)

## 2021-01-31 ENCOUNTER — Ambulatory Visit (INDEPENDENT_AMBULATORY_CARE_PROVIDER_SITE_OTHER): Payer: Self-pay | Admitting: Physician Assistant

## 2021-01-31 ENCOUNTER — Encounter: Payer: Self-pay | Admitting: Physician Assistant

## 2021-01-31 ENCOUNTER — Ambulatory Visit
Admission: RE | Admit: 2021-01-31 | Discharge: 2021-01-31 | Disposition: A | Discharge status: Home / Self Care | Payer: Self-pay | Source: Ambulatory Visit | Attending: Thoracic Surgery (Cardiothoracic Vascular Surgery) | Admitting: Thoracic Surgery (Cardiothoracic Vascular Surgery)

## 2021-01-31 ENCOUNTER — Other Ambulatory Visit: Payer: Self-pay

## 2021-01-31 ENCOUNTER — Telehealth: Payer: Self-pay | Admitting: Thoracic Surgery (Cardiothoracic Vascular Surgery)

## 2021-01-31 VITALS — BP 148/94 | HR 100 | Temp 98.2°F | Resp 20 | Ht 66.0 in | Wt 235.0 lb

## 2021-01-31 DIAGNOSIS — Z951 Presence of aortocoronary bypass graft: Secondary | ICD-10-CM

## 2021-01-31 DIAGNOSIS — Z4889 Encounter for other specified surgical aftercare: Secondary | ICD-10-CM

## 2021-01-31 NOTE — Progress Notes (Signed)
[address]       [place] [address]             [phone]        Matthew Tapia is a 55 y.o. 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 His initial postoperative course was uneventful but several months later he had several episodes of heavy coughing and sneezing and subsequently developed increased pain in the lower portion of the sternum.  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  He underwent KLS plating of the inferior portion of the sternum by Dr. Cliffton Asters on 01/06/2021 and tolerated this procedure well.  On the day after surgery, he had a coughing episode while in the hospital saturating his chest dressing with mucoid drainage.  He was started on IV Zosyn in vancomycin empirically.  A Prevena negative pressure dressing was placed over the incision.  He was observed in the hospital for another 24 hours and had no further drainage from the sternal incision.  He was discharged home on 01/09/2021 with a Prevena dressing in place and on oral Keflex. He returns today for scheduled follow-up. He presented to the ED for chest wall pain and was treated with a prevena wound dressing and antibiotics.   He returns to the clinic today due to right sided edema to his chest extending down to his armpit. He states that he has not violated his sternal precautions and has not lifted greater than 5 lbs. He has not had any pain.    1. Encounter for post surgical wound check    Past Medical History:  Diagnosis Date   Anxiety    Asthma    CAD, multiple vessel    CHF (congestive heart failure) (HCC)    Dyslipidemia, goal LDL below 70    Former smoker    Quit 2021   HFrEF (heart failure with reduced ejection fraction) (HCC)    Hypertension    Polysubstance abuse (HCC)    UDS positive for cocaine, amphetamines, benzos 05/15/20   ST elevation myocardial infarction (STEMI) of inferior wall (HCC)    Stroke (HCC) 10/03/2020   No past  surgical history pertinent negatives on file. Scheduled Meds: Current Outpatient Medications on File Prior to Visit  Medication Sig Dispense Refill   aspirin EC 81 MG tablet Take 1 tablet (81 mg total) by once mouth daily. Swallow whole. 90 tablet 1   atorvastatin (LIPITOR) 80 MG tablet Take 1 tablet (80 mg total) by mouth once daily. 90 tablet 1   carvedilol (COREG) 6.25 MG tablet Take 1 tablet (6.25 mg total) by mouth 2 (two) times daily. 180 tablet 1   cephALEXin (KEFLEX) 500 MG capsule Take 1 capsule (500 mg total) by mouth 3 (three) times daily for 7 days. 21 capsule 0   furosemide (LASIX) 20 MG tablet Take 1 tablet (20 mg total) by mouth once daily. 180 tablet 1   ibuprofen (ADVIL) 200 MG tablet Take 4 tablets (800 mg total) by mouth every 8 (eight) hours as needed for mild pain or moderate pain. 30 tablet 0   losartan (COZAAR) 25 MG tablet Take 1 tablet (25 mg total) by mouth once daily. 90 tablet 1   nicotine (NICODERM CQ - DOSED IN MG/24 HOURS) 14 mg/24hr patch Place 1 patch (14 mg total) onto the skin daily. 28 patch 0   oxyCODONE 10 MG TABS Take 1 tablet (10 mg total) by mouth  every 6 (six) hours as needed for severe pain. 28 tablet 0   polyethylene glycol (MIRALAX / GLYCOLAX) 17 g packet Take 17 g by mouth daily. 14 each 0   No current facility-administered medications on file prior to visit.     Allergies  Allergen Reactions   Dilaudid [Hydromorphone Hcl] Shortness Of Breath   Active Problems:   * No active hospital problems. *  Blood pressure (!) 148/94, pulse 100, temperature 98.2 F (36.8 C), resp. rate 20, height 5\' 6"  (1.676 m), weight 235 lb (106.6 kg), SpO2 95 %.  No read available on CXR but looks like his last sternal wire pulled through.   Subjective Objective: Vital signs (most recent): Blood pressure (!) 148/94, pulse 100, temperature 98.2 F (36.8 C), resp. rate 20, height 5\' 6"  (1.676 m), weight 235 lb (106.6 kg), SpO2 95 %. Assessment & Plan   Right  sternal swelling- No seroma present. He states that there was no moment where he felt sudden pain he did however sneeze and thought this was when the wire might have popped loose.   Plan: I had Dr. see him just to be sure given his complicated history. There is nothing to treat at this time. Encouraged to continue sternal precautions. He has been applying heat to the area which is fine. He can also use ice if needed. He will follow-up with next month.    Cliffton Asters 01/31/2021

## 2021-02-10 ENCOUNTER — Telehealth: Payer: Self-pay | Admitting: *Deleted

## 2021-02-10 NOTE — Telephone Encounter (Signed)
Patient contacted the office stating he is experiencing an increase in pain and right sided chest swelling extending to his right armpit. Patient states the swelling has gotten worse since he saw a PA nearly two weeks ago in clinic. Patient states he has taken multiple tylenol and ibuprofen's to help manage this pain without much relief. Patient denies fever, redness, or drainage from incision. Patient states he may need to be referred to a "pain clinic" to help with this pain. Appt made for patient to see Dr. Cliffton Asters later this week. Patient verbalizes understanding.

## 2021-02-14 ENCOUNTER — Ambulatory Visit: Payer: Self-pay | Admitting: Thoracic Surgery (Cardiothoracic Vascular Surgery)

## 2021-02-18 ENCOUNTER — Telehealth: Payer: Self-pay | Admitting: Licensed Clinical Social Worker

## 2021-02-18 NOTE — Telephone Encounter (Signed)
LCSW called pt for check in. He confirms he is still having swelling and discomfort in his chest and arm. He has a scheduled appt to see Dr. Cliffton Asters this Friday and has arranged transportation to get there. Due to his discomfort he thinks that he is going to reschedule his appt for tomorrow to see the [facility]. LCSW provided pt with his case ID # and number for DDS and encouraged him to regularly check in with that office to ensure that updated clinicals have been submitted and update any paperwork needed. Pt appreciative of this information and my call. Encouraged him to let me know if he has any additional concerns or questions. Provided support for upcoming appt.   Matthew Tapia, MSW, LCSW [facility] Heart/Vascular Care Navigation  [phone]

## 2021-02-19 ENCOUNTER — Other Ambulatory Visit: Payer: Self-pay

## 2021-02-19 ENCOUNTER — Ambulatory Visit: Payer: Self-pay

## 2021-02-19 DIAGNOSIS — Z79899 Other long term (current) drug therapy: Secondary | ICD-10-CM

## 2021-02-19 NOTE — Progress Notes (Signed)
[facility] Visit Note  Patient: Matthew Tapia MRN: [id number] Date of Birth: 06/21/1965 PCP: Matthew Kins, FNP   Matthew Tapia 55 y.o. male presents for a MTM visit via telephone today. Patient identified by name and date birth.  There were no vitals taken for this visit.  Patient Information   Past Medical History:  Diagnosis Date   Anxiety    Asthma    CAD, multiple vessel    CHF (congestive heart failure) (HCC)    Dyslipidemia, goal LDL below 70    Former smoker    Quit 2021   HFrEF (heart failure with reduced ejection fraction) (HCC)    Hypertension    Polysubstance abuse (HCC)    UDS positive for cocaine, amphetamines, benzos 05/15/20   ST elevation myocardial infarction (STEMI) of inferior wall (HCC)    Stroke (HCC) 10/03/2020      Past Surgical History:  Procedure Laterality Date   CARDIAC CATHETERIZATION     CORONARY ANGIOGRAPHY N/A 05/20/2020   Procedure: CORONARY ANGIOGRAPHY;  Surgeon: Kathleene Hazel, MD;  [facility]: [facility];  Service: Cardiovascular;  Laterality: N/A;   CORONARY ARTERY BYPASS GRAFT N/A 05/21/2020   Procedure: CORONARY ARTERY BYPASS GRAFTING TIMES FOUR USING LEFT INTERNAL MAMMARY ARTERY  AND ENDOSCOPICALLY HARVESTED RIGHT GREATER SAPHANOUS VEIN.;  Surgeon: Corliss Skains, MD;  Location: [facility] OR;  Service: Open Heart Surgery;  Laterality: N/A;  flow track   CORONARY THROMBECTOMY N/A 05/20/2020   Procedure: Coronary Thrombectomy;  Surgeon: Kathleene Hazel, MD;  Location: [facility];  Service: Cardiovascular;  Laterality: N/A;   CORONARY/GRAFT ACUTE MI REVASCULARIZATION N/A 05/14/2020   Procedure: Coronary/Graft Acute MI Revascularization;  Surgeon: Iran Ouch, MD;  Location: [facility] INVASIVE CV LAB;  Service: Cardiovascular;  Laterality: N/A;   CORONARY/GRAFT ACUTE MI REVASCULARIZATION N/A 05/20/2020   Procedure: Coronary/Graft Acute MI Revascularization;  Surgeon: Kathleene Hazel, MD;  [facility]: [facility];  Service: Cardiovascular;  Laterality: N/A;   ENDOVEIN HARVEST OF GREATER SAPHENOUS VEIN Right 05/21/2020   Procedure: ENDOVEIN HARVEST OF RIGHT GREATER SAPHENOUS VEIN;  Surgeon: Corliss Skains, MD;  Location: [facility] OR;  Service: Open Heart Surgery;  Laterality: Right;   INTRAVASCULAR PRESSURE WIRE/FFR STUDY N/A 05/16/2020   Procedure: INTRAVASCULAR PRESSURE WIRE/FFR STUDY;  Surgeon: Iran Ouch, MD;  Location: [facility] INVASIVE CV LAB;  Service: Cardiovascular;  Laterality: N/A;   LEFT HEART CATH AND CORONARY ANGIOGRAPHY N/A 05/14/2020   Procedure: LEFT HEART CATH AND CORONARY ANGIOGRAPHY;  Surgeon: Iran Ouch, MD;  Location: [facility] INVASIVE CV LAB;  Service: Cardiovascular;  Laterality: N/A;   LEFT HEART CATH AND CORONARY ANGIOGRAPHY N/A 05/16/2020   Procedure: LEFT HEART CATH AND CORONARY ANGIOGRAPHY;  Surgeon: Iran Ouch, MD;  Location: [facility] INVASIVE CV LAB;  Service: Cardiovascular;  Laterality: N/A;   RIB PLATING N/A 01/06/2021   Procedure: STERNAL PLATING;  Surgeon: Corliss Skains, MD;  Location: [facility] OR;  Service: Thoracic;  Laterality: N/A;   SHOULDER SURGERY     TEE WITHOUT CARDIOVERSION N/A 05/21/2020   Procedure: TRANSESOPHAGEAL ECHOCARDIOGRAM (TEE);  Surgeon: Corliss Skains, MD;  Location: [facility] OR;  Service: Open Heart Surgery;  Laterality: N/A;     Family History  Problem Relation Age of Onset   Heart attack Father     New Diagnoses (since last visit): stroke in 09/2020, STEMI 04/2020  Family Support: Good  Lifestyle Diet: Breakfast: egg sandwich Lunch: sandwich Dinner: hamburger, pork chops,  chicken Drinks: tea, coca cola            Social History   Substance and Sexual Activity  Alcohol Use No      Social History   Tobacco Use  Smoking Status Every Day   Packs/day: 0.25   Years: 42.00   Pack years: 10.50   Types: Cigarettes  Smokeless Tobacco Never  Tobacco Comments   1-2  cigarettes "every now and then"      Health Maintenance  Topic Date Due   COVID-19 Vaccine (1) Never done   Hepatitis C Screening  Never done   Zoster Vaccines- Shingrix (1 of 2) Never done   COLONOSCOPY (Pts 45-40yrs Insurance coverage will need to be confirmed)  Never done   INFLUENZA VACCINE  Never done   TETANUS/TDAP  11/24/2030   HIV Screening  Completed   HPV VACCINES  Aged Out   Outpatient Encounter Medications as of 02/19/2021  Medication Sig   aspirin EC 81 MG tablet Take 1 tablet (81 mg total) by once mouth daily. Swallow whole.   atorvastatin (LIPITOR) 80 MG tablet Take 1 tablet (80 mg total) by mouth once daily.   carvedilol (COREG) 6.25 MG tablet Take 1 tablet (6.25 mg total) by mouth 2 (two) times daily.   furosemide (LASIX) 20 MG tablet Take 1 tablet (20 mg total) by mouth once daily.   ibuprofen (ADVIL) 200 MG tablet Take 4 tablets (800 mg total) by mouth every 8 (eight) hours as needed for mild pain or moderate pain.   losartan (COZAAR) 25 MG tablet Take 1 tablet (25 mg total) by mouth once daily.   cephALEXin (KEFLEX) 500 MG capsule Take 1 capsule (500 mg total) by mouth 3 (three) times daily for 7 days. (Patient not taking: Reported on 02/19/2021)   nicotine (NICODERM CQ - DOSED IN MG/24 HOURS) 14 mg/24hr patch Place 1 patch (14 mg total) onto the skin daily. (Patient not taking: Reported on 02/19/2021)   oxyCODONE 10 MG TABS Take 1 tablet (10 mg total) by mouth every 6 (six) hours as needed for severe pain. (Patient not taking: Reported on 02/19/2021)   polyethylene glycol (MIRALAX / GLYCOLAX) 17 g packet Take 17 g by mouth daily. (Patient not taking: Reported on 02/19/2021)   No facility-administered encounter medications on file as of 02/19/2021.     Health Maintenance/Date Completed  Last ED visit: 8/24 Last Visit to PCP: Matthew Tapia at [facility] Next Visit to PCP: unsure; cannot afford.  Specialist Visit: Dr. Cliffton Asters surgeon (has appt on  Friday due to recent pain); Dr. Kirke Corin cargiologist (has not made f/u) Dental Exam: dentures; has had for many years Eye Exam: wears reading glasses Prostate Exam: does not think so  Colonoscopy: plans to start once able to see PCP regularly (hopefully if insurance goes through) Flu Vaccine: never Pneumonia Vaccine: no COVID-19 Vaccine: no Shingrix Vaccine: no    Assessment and Plan: Adherence/STEMI 04/2021 and stroke 09/2020: Misses morning medications at least 1 time in past 2 weeks. Has a pill pack. Sometimes unable to reach pill pack in the morning due to pain. Advised patient to keep pill pack close by to ensure morning medications are taken. Emphasized importance of adherence given recent STEMI.  Pain due to recent sternum surgery in August 2022. Currently in pain. Patient has appointment with surgeon scheduled for Friday, plans to attend. Taking oxycodone 5 mg / 325 mg with 1 bowel movement/day. Tobacco cessation: Has never tried nicotine replacement therapy. Patient  expresses desire to quit. 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 Health maintenance: In process of applying to Medicaid with assistance of Dr. Jari Sportsman office. Unable to afford primary care visits. Reached out to [facility] to schedule appointment with patient. Spoke with Willeen Niece to assist in health care coordination while patient is uninsured.   RTC: 1 year    Jaynie Bream, PharmD Pharmacy Resident  02/19/2021 3:30 PM

## 2021-02-20 ENCOUNTER — Telehealth: Payer: Self-pay | Admitting: Pharmacy Technician

## 2021-02-20 ENCOUNTER — Other Ambulatory Visit: Payer: Self-pay

## 2021-02-20 ENCOUNTER — Telehealth: Payer: Self-pay | Admitting: Licensed Clinical Social Worker

## 2021-02-20 NOTE — Telephone Encounter (Signed)
During MTM appointment with Matthew Tapia, patient expressed concern regarding [place] medical bills.  Stated that he could no longer afford cardiologist and provider.  Patient has appointment with [facility] on 03/06/21.  Patient has applied for Medicaid.  Medicaid status is pending.  Patient cannot apply for [facility] until he receives a determination from [place].   Sherilyn Dacosta Care Manager [facility]

## 2021-02-20 NOTE — Telephone Encounter (Signed)
Received call back from [place], care management w/ [facility]. She shares that pt had a virtual visit w/ team at clinic yesterday. Pt discussed concerns of ongoing bills and concern about not being able to continue seeing the TCTS/cardiology team due to no financial assistance. LCSW clarified that pt is currently Medicaid Pending. He unfortunately is not able to apply for [facility] while waiting on Medicaid application. Kathie Rhodes offered to send referral to [facility] which I think would be helpful, I was not aware that it was the same application for both clinics. I let Kathie Rhodes know that I remain available to pt and have encouraged him to f/u regularly w/ disability and DSS offices. If denied for Medicaid will be eligible for [employer].   [name], MSW, LCSW [facility] Heart/Vascular Care Navigation  [phone]

## 2021-02-20 NOTE — Telephone Encounter (Signed)
Received call from [facility] from [place]. LCSW returned call this morning to [phone]; no answer, left message.   [name], MSW, LCSW [facility] Heart/Vascular Care Navigation  [phone]

## 2021-02-21 ENCOUNTER — Ambulatory Visit: Payer: Self-pay | Admitting: Thoracic Surgery (Cardiothoracic Vascular Surgery)

## 2021-02-21 ENCOUNTER — Other Ambulatory Visit: Payer: Self-pay

## 2021-02-21 ENCOUNTER — Ambulatory Visit (INDEPENDENT_AMBULATORY_CARE_PROVIDER_SITE_OTHER): Payer: Self-pay | Admitting: Thoracic Surgery (Cardiothoracic Vascular Surgery)

## 2021-02-21 VITALS — BP 140/89 | HR 89 | Resp 18 | Ht 66.0 in | Wt 245.0 lb

## 2021-02-21 DIAGNOSIS — Z09 Encounter for follow-up examination after completed treatment for conditions other than malignant neoplasm: Secondary | ICD-10-CM

## 2021-02-21 NOTE — Progress Notes (Signed)
      [address]       [place] [address]             [phone]        Matthew Tapia [place] Medical Record #[id number] Date of Birth: [birth year]  Referring: Iran Ouch, MD Primary Care: Miki Kins, FNP Primary Cardiologist:Muhammad Kirke Corin, MD  Reason for visit:   follow-up  History of Present Illness:     [name] presents for his 1 month follow-up appointment.  He has multiple complaints about chest wall pain, that he thinks is due to his increased weight.  He is asking for pain medication despite recently receiving pain medication from his primary care provider.  He denies any clicking or drainage.  Physical Exam: BP 140/89 (BP Location: Right Arm, Patient Position: Sitting)   Pulse 89   Resp 18   Ht 5\' 6"  (1.676 m)   Wt 245 lb (111.1 kg)   SpO2 96% Comment: Ra  BMI 39.54 kg/m   Alert NAD Incision healing well.  He does have several scratch marks around his incision..  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  He also has demonstrated some drug-seeking behaviors.  He would like to be evaluated in a pain clinic and have made a referral for this.  He will follow-up as needed.  Due to his behavior I do not think that we will write for any more pain medication for him.   59 02/21/2021 7:02 PM

## 2021-02-24 ENCOUNTER — Other Ambulatory Visit: Payer: Self-pay

## 2021-02-28 ENCOUNTER — Ambulatory Visit: Payer: Self-pay | Admitting: Medical

## 2021-03-06 ENCOUNTER — Ambulatory Visit: Payer: Self-pay | Admitting: Gerontology

## 2021-03-07 ENCOUNTER — Ambulatory Visit: Payer: Self-pay | Admitting: Thoracic Surgery (Cardiothoracic Vascular Surgery)

## 2021-03-10 ENCOUNTER — Telehealth: Payer: Self-pay | Admitting: Licensed Clinical Social Worker

## 2021-03-10 NOTE — Telephone Encounter (Signed)
Noted pt no showed his appt at [facility] to establish with PCP there. Attempted to reach pt to see if he was interested in rescheduling/if he needed ride to appt.  No answer, left message at [phone].    [name], MSW, LCSW [facility] Heart/Vascular Care Navigation  [phone]

## 2021-03-13 ENCOUNTER — Telehealth: Payer: Self-pay | Admitting: Licensed Clinical Social Worker

## 2021-03-13 NOTE — Telephone Encounter (Signed)
LCSW attempted again to reach pt. No answer, left voicemail for pt to return call at [phone].  Matthew Tapia, MSW, LCSW [facility] Heart/Vascular Care Navigation  [phone]

## 2021-03-18 ENCOUNTER — Telehealth: Payer: Self-pay | Admitting: Licensed Clinical Social Worker

## 2021-03-18 NOTE — Telephone Encounter (Signed)
Third message left for pt on cell phone, able to leave voicemail at [phone]. Remain available should pt be interested in re-engaging/rescheduling appointments.   Matthew Tapia, MSW, LCSW [facility] Heart/Vascular Care Navigation  [phone]

## 2021-03-25 ENCOUNTER — Emergency Department (HOSPITAL_COMMUNITY)
Admission: EM | Admit: 2021-03-25 | Discharge: 2021-03-25 | Disposition: A | Discharge status: Home / Self Care | Payer: Medicaid Other | Attending: Emergency Medicine | Admitting: Emergency Medicine

## 2021-03-25 ENCOUNTER — Emergency Department (HOSPITAL_COMMUNITY): Payer: Medicaid Other

## 2021-03-25 ENCOUNTER — Telehealth: Payer: Self-pay | Admitting: *Deleted

## 2021-03-25 ENCOUNTER — Other Ambulatory Visit: Payer: Self-pay

## 2021-03-25 DIAGNOSIS — F17210 Nicotine dependence, cigarettes, uncomplicated: Secondary | ICD-10-CM | POA: Insufficient documentation

## 2021-03-25 DIAGNOSIS — Z79899 Other long term (current) drug therapy: Secondary | ICD-10-CM | POA: Insufficient documentation

## 2021-03-25 DIAGNOSIS — I2510 Atherosclerotic heart disease of native coronary artery without angina pectoris: Secondary | ICD-10-CM | POA: Diagnosis not present

## 2021-03-25 DIAGNOSIS — J45909 Unspecified asthma, uncomplicated: Secondary | ICD-10-CM | POA: Diagnosis not present

## 2021-03-25 DIAGNOSIS — I5042 Chronic combined systolic (congestive) and diastolic (congestive) heart failure: Secondary | ICD-10-CM | POA: Insufficient documentation

## 2021-03-25 DIAGNOSIS — R072 Precordial pain: Secondary | ICD-10-CM | POA: Insufficient documentation

## 2021-03-25 DIAGNOSIS — R079 Chest pain, unspecified: Secondary | ICD-10-CM

## 2021-03-25 DIAGNOSIS — Z7982 Long term (current) use of aspirin: Secondary | ICD-10-CM | POA: Insufficient documentation

## 2021-03-25 DIAGNOSIS — I110 Hypertensive heart disease with heart failure: Secondary | ICD-10-CM | POA: Insufficient documentation

## 2021-03-25 LAB — CBC
HCT: 44.1 % (ref 39.0–52.0)
Hemoglobin: 14.4 g/dL (ref 13.0–17.0)
MCH: 32.3 pg (ref 26.0–34.0)
MCHC: 32.7 g/dL (ref 30.0–36.0)
MCV: 98.9 fL (ref 80.0–100.0)
Platelets: 286 K/uL (ref 150–400)
RBC: 4.46 MIL/uL (ref 4.22–5.81)
RDW: 13.1 % (ref 11.5–15.5)
WBC: 8.9 K/uL (ref 4.0–10.5)
nRBC: 0 % (ref 0.0–0.2)

## 2021-03-25 LAB — BASIC METABOLIC PANEL WITH GFR
Anion gap: 9 (ref 5–15)
BUN: 10 mg/dL (ref 6–20)
CO2: 23 mmol/L (ref 22–32)
Calcium: 9.1 mg/dL (ref 8.9–10.3)
Chloride: 103 mmol/L (ref 98–111)
Creatinine, Ser: 0.81 mg/dL (ref 0.61–1.24)
GFR, Estimated: >60 mL/min
Glucose, Bld: 113 mg/dL — ABNORMAL HIGH (ref 70–99)
Potassium: 3.8 mmol/L (ref 3.5–5.1)
Sodium: 135 mmol/L (ref 135–145)

## 2021-03-25 MED ORDER — HYDROCODONE-ACETAMINOPHEN 5-325 MG PO TABS
1.0000 | ORAL_TABLET | Freq: Once | ORAL | Status: AC
Start: 2021-03-25 — End: 2021-03-25
  Administered 2021-03-25: 1 via ORAL
  Filled 2021-03-25: qty 1

## 2021-03-25 MED ORDER — OXYCODONE HCL 5 MG PO TABS: 5.0000 mg | ORAL_TABLET | Freq: Four times a day (QID) | ORAL | 0 refills | Status: AC | PRN

## 2021-03-25 MED ORDER — OXYCODONE HCL 5 MG PO TABS
5.0000 mg | ORAL_TABLET | Freq: Once | ORAL | Status: AC
  Administered 2021-03-25: 5 mg via ORAL
  Filled 2021-03-25: qty 1

## 2021-03-25 NOTE — Telephone Encounter (Signed)
Patient contacted the office stating he was laying in the bed last night when he heard something in his chest "pop". Per patient he is in excruciating pain. Describes the pain to be in the center of his sternal incision and feels like a wire is pulling with movement. Patient states the right side of his chest has been swollen since his sternal plating procedure on 8/15 but now it is significantly more swollen. Advised patient because of his pain level to seek care via emergency room. Patient acknowledges receipt.

## 2021-03-25 NOTE — ED Provider Notes (Signed)
Emergency Medicine Provider Triage Evaluation Note  Matthew Tapia , a 55 y.o. male  was evaluated in triage.  Pt complains of sternal pain since yesterday.  Has history of CABG with subsequent revision as one of the wires came loose.  Revision was done in August.  He feels like something is "come loose again."He denies any actual chest pain, nausea, vomiting, diarrhea, or shortness of breath.  Review of Systems  Positive:  Negative: See above   Physical Exam  BP 134/89 (BP Location: Left Arm)   Pulse 90   Temp 98.5 F (36.9 C) (Oral)   Resp (!) 21   Ht 5\' 6"  (1.676 m)   Wt 111.1 kg   SpO2 96%   BMI 39.54 kg/m  Gen:   Awake, no distress   Resp:  Normal effort  MSK:   Moves extremities without difficulty  Other:  Well-healed sternotomy scar.  Right sided chest is swollen.  It is not warm to palpation.  Medical Decision Making  Medically screening exam initiated at 2:31 PM.  Appropriate orders placed.  Matthew Tapia was informed that the remainder of the evaluation will be completed by another provider, this initial triage assessment does not replace that evaluation, and the importance of remaining in the ED until their evaluation is complete.     [name] [place], PA-C 03/25/21 1432    13/01/22, MD 03/25/21 [phone]

## 2021-03-25 NOTE — Discharge Instructions (Addendum)
Return for any problem.  ?

## 2021-03-25 NOTE — ED Triage Notes (Signed)
Pt here POV with c/o chest pain. Pt states the pain feels as if the wires are cutting him from recent surgery. Cardiac history. 10/10. Taking OTC pain medications without relief. Aug 15 most recent sternum surgery.

## 2021-03-25 NOTE — ED Provider Notes (Signed)
[facility] EMERGENCY DEPARTMENT Provider Note   CSN: [id number] Arrival date & time: 03/25/21  1310     History Chief Complaint  Patient presents with   Chest Pain    Matthew Tapia is a 55 y.o. male.  55 year old male with prior medical history as detailed below presents for evaluation.  Patient with recent sternotomy which required revision.  Patient is known to Dr. Cliffton Asters of cardiothoracic's.  Patient with reported frequent pain to the sternum after these procedures.  Patient is not currently on pain medications.  Patient presents today complaining of worsening pain to the right lower edge of his sternum.  He has had similar pain previously.  He is concerned that the wires from his most recent sternotomy revision are causing significant pain.  There is no overlying erythema reported.  Patient without fever.  Patient without shortness of breath or new type of chest pain.  Patient has not recently seen the cardiothoracic team.  Patient is not currently on any narcotics for treatment of his pain.  Patient denies any specific inciting event.  Patient denies hearing any "popping" or "clicking" with chest wall movements.  The history is provided by the patient.  Chest Pain Chest pain location: Right lower sternal border. Pain quality: stabbing   Pain radiates to:  Does not radiate Pain severity:  Mild Onset quality:  Gradual Duration:  8 weeks Timing:  Intermittent Progression:  Worsening Chronicity:  Recurrent     Past Medical History:  Diagnosis Date   Anxiety    Asthma    CAD, multiple vessel    CHF (congestive heart failure) (HCC)    Dyslipidemia, goal LDL below 70    Former smoker    Quit 2021   HFrEF (heart failure with reduced ejection fraction) (HCC)    Hypertension    Polysubstance abuse (HCC)    UDS positive for cocaine, amphetamines, benzos 05/15/20   ST elevation myocardial infarction (STEMI) of inferior wall (HCC)    Stroke (HCC)  10/03/2020    Patient Active Problem List   Diagnosis Date Noted   Sternal pain 01/15/2021   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   Lactic acidosis 10/04/2020   Dehydration 10/04/2020   Chronic combined systolic and diastolic CHF (congestive heart failure) (HCC) 10/04/2020   Acute encephalopathy 10/03/2020   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   CAD (coronary artery disease) 05/17/2020   HFrEF (heart failure with reduced ejection fraction) (HCC)    CAD, 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   Polysubstance abuse (HCC) 05/16/2020   Cardiomyopathy, ischemic 05/16/2020   Hyperlipidemia LDL goal <70 05/16/2020   Essential hypertension 05/16/2020   Elevated hemoglobin A1c 05/16/2020   Acute ST elevation myocardial infarction (STEMI) of inferior wall (HCC) 05/14/2020   Tobacco abuse 05/14/2020   Leukocytosis 05/14/2020   Anxiety 05/14/2020   Cough 05/14/2020    Past Surgical History:  Procedure Laterality Date   CARDIAC CATHETERIZATION     CORONARY ANGIOGRAPHY N/A 05/20/2020   Procedure: CORONARY ANGIOGRAPHY;  Surgeon: Kathleene Hazel, MD;  Location: [facility] INVASIVE CV LAB;  Service: Cardiovascular;  Laterality: N/A;   CORONARY ARTERY BYPASS GRAFT N/A 05/21/2020   Procedure: CORONARY ARTERY BYPASS GRAFTING TIMES FOUR USING LEFT INTERNAL MAMMARY ARTERY  AND ENDOSCOPICALLY HARVESTED RIGHT GREATER SAPHANOUS VEIN.;  Surgeon: Corliss Skains, MD;  Location: [facility] OR;  Service: Open Heart Surgery;  Laterality: N/A;  flow track   CORONARY THROMBECTOMY N/A 05/20/2020   Procedure: Coronary Thrombectomy;  Surgeon: Verne Carrow  D, MD;  Location: [facility] INVASIVE CV LAB;  Service: Cardiovascular;  Laterality: N/A;   CORONARY/GRAFT ACUTE MI REVASCULARIZATION N/A 05/14/2020   Procedure: Coronary/Graft Acute MI Revascularization;  Surgeon: Iran Ouch, MD;  Location: [facility] INVASIVE CV LAB;  Service: Cardiovascular;  Laterality: N/A;   CORONARY/GRAFT ACUTE MI REVASCULARIZATION N/A  05/20/2020   Procedure: Coronary/Graft Acute MI Revascularization;  Surgeon: Kathleene Hazel, MD;  [facility]: [facility];  Service: Cardiovascular;  Laterality: N/A;   ENDOVEIN HARVEST OF GREATER SAPHENOUS VEIN Right 05/21/2020   Procedure: ENDOVEIN HARVEST OF RIGHT GREATER SAPHENOUS VEIN;  Surgeon: Corliss Skains, MD;  Location: [facility] OR;  Service: Open Heart Surgery;  Laterality: Right;   INTRAVASCULAR PRESSURE WIRE/FFR STUDY N/A 05/16/2020   Procedure: INTRAVASCULAR PRESSURE WIRE/FFR STUDY;  Surgeon: Iran Ouch, MD;  Location: [facility] INVASIVE CV LAB;  Service: Cardiovascular;  Laterality: N/A;   LEFT HEART CATH AND CORONARY ANGIOGRAPHY N/A 05/14/2020   Procedure: LEFT HEART CATH AND CORONARY ANGIOGRAPHY;  Surgeon: Iran Ouch, MD;  Location: [facility] INVASIVE CV LAB;  Service: Cardiovascular;  Laterality: N/A;   LEFT HEART CATH AND CORONARY ANGIOGRAPHY N/A 05/16/2020   Procedure: LEFT HEART CATH AND CORONARY ANGIOGRAPHY;  Surgeon: Iran Ouch, MD;  Location: [facility] INVASIVE CV LAB;  Service: Cardiovascular;  Laterality: N/A;   RIB PLATING N/A 01/06/2021   Procedure: STERNAL PLATING;  Surgeon: Corliss Skains, MD;  Location: [facility] OR;  Service: Thoracic;  Laterality: N/A;   SHOULDER SURGERY     TEE WITHOUT CARDIOVERSION N/A 05/21/2020   Procedure: TRANSESOPHAGEAL ECHOCARDIOGRAM (TEE);  Surgeon: Corliss Skains, MD;  Location: [facility] OR;  Service: Open Heart Surgery;  Laterality: N/A;       Family History  Problem Relation Age of Onset   Heart attack Father     Social History   Tobacco Use   Smoking status: Every Day    Packs/day: 0.25    Years: 42.00    Pack years: 10.50    Types: Cigarettes   Smokeless tobacco: Former    Types: Chew    Quit date: 2000   Tobacco comments:    1-2 cigarettes "every now and then"  Vaping Use   Vaping Use: Never used  Substance Use Topics   Alcohol use: No   Drug use: Not Currently    Home Medications Prior to  Admission medications   Medication Sig Start Date End Date Taking? Authorizing Provider  aspirin EC 81 MG tablet Take 1 tablet (81 mg total) by once mouth daily. Swallow whole. 11/28/20   Iran Ouch, MD  atorvastatin (LIPITOR) 80 MG tablet Take 1 tablet (80 mg total) by mouth once daily. 12/27/20 03/27/21  Furth, Cadence H, PA-C  carvedilol (COREG) 6.25 MG tablet Take 1 tablet (6.25 mg total) by mouth 2 (two) times daily. 12/27/20   Furth, Cadence H, PA-C  cephALEXin (KEFLEX) 500 MG capsule Take 1 capsule (500 mg total) by mouth 3 (three) times daily for 7 days. 01/20/21     furosemide (LASIX) 20 MG tablet Take 1 tablet (20 mg total) by mouth once daily. 12/27/20   Furth, Cadence H, PA-C  ibuprofen (ADVIL) 200 MG tablet Take 4 tablets (800 mg total) by mouth every 8 (eight) hours as needed for mild pain or moderate pain. 01/20/21   Gold, Wayne E, PA-C  losartan (COZAAR) 25 MG tablet Take 1 tablet (25 mg total) by mouth once daily. 12/27/20 03/27/21  Furth, Cadence H, PA-C  oxyCODONE 10  MG TABS Take 1 tablet (10 mg total) by mouth every 6 (six) hours as needed for severe pain. 01/20/21   Gold, Wayne E, PA-C  polyethylene glycol (MIRALAX / GLYCOLAX) 17 g packet Take 17 g by mouth daily. 01/20/21   Rowe Clack, PA-C    Allergies    Dilaudid [hydromorphone hcl]  Review of Systems   Review of Systems  Cardiovascular:  Positive for chest pain.  All other systems reviewed and are negative.  Physical Exam Updated Vital Signs BP 135/90 (BP Location: Left Arm)   Pulse 85   Temp 98.2 F (36.8 C) (Oral)   Resp 16   Ht 5\' 6"  (1.676 m)   Wt 111.1 kg   SpO2 98%   BMI 39.54 kg/m   Physical Exam Vitals and nursing note reviewed.  Constitutional:      General: He is not in acute distress.    Appearance: Normal appearance. He is well-developed.  HENT:     Head: Normocephalic and atraumatic.  Eyes:     Conjunctiva/sclera: Conjunctivae normal.     Pupils: Pupils are equal, round, and reactive to  light.  Cardiovascular:     Rate and Rhythm: Normal rate and regular rhythm.     Heart sounds: Normal heart sounds.  Pulmonary:     Effort: Pulmonary effort is normal. No respiratory distress.     Breath sounds: Normal breath sounds.  Chest:     Comments: Patient is localizing his pain to the right lower sternal border.  This is at the site of his most recent sternotomy revision.  Overlying skin is without erythema.  There is no fluctuance or crepitus. Abdominal:     General: There is no distension.     Palpations: Abdomen is soft.     Tenderness: There is no abdominal tenderness.  Musculoskeletal:        General: No deformity. Normal range of motion.     Cervical back: Normal range of motion and neck supple.  Skin:    General: Skin is warm and dry.  Neurological:     General: No focal deficit present.     Mental Status: He is alert and oriented to person, place, and time.    ED Results / Procedures / Treatments   Labs (all labs ordered are listed, but only abnormal results are displayed) Labs Reviewed  BASIC METABOLIC PANEL - Abnormal; Notable for the following components:      Result Value   Glucose, Bld 113 (*)    All other components within normal limits  CBC    EKG EKG Interpretation  Date/Time:  Tuesday March 25 2021 14:08:29 EDT Ventricular Rate:  87 PR Interval:  128 QRS Duration: 96 QT Interval:  384 QTC Calculation: 462 R Axis:   14 Text Interpretation: Normal sinus rhythm Inferior infarct , age undetermined Possible Anterolateral infarct , age undetermined Abnormal ECG Confirmed by 12-[birth year]-1978 [phone]) on 03/25/2021 6:39:49 PM  Radiology DG Chest 2 View  Result Date: 03/25/2021 CLINICAL DATA:  Chest pain. EXAM: CHEST - 2 VIEW COMPARISON:  01/31/2021 FINDINGS: Sequelae of CABG are again identified with unchanged cardiomediastinal silhouette. The inferior-most sternal wire is again noted to be fractured and demonstrates some interval rotation and splaying  compared to the prior study with the superior aspect of the wire projecting anterior to the sternum. No confluent airspace opacity, edema, pleural effusion, pneumothorax is identified. Soft tissue swelling is noted anterior to the sternum. IMPRESSION: No evidence of active cardiopulmonary disease.  Electronically Signed   By: Sebastian Ache M.D.   On: 03/25/2021 16:06    Procedures Procedures   Medications Ordered in ED Medications  HYDROcodone-acetaminophen (NORCO/VICODIN) 5-325 MG per tablet 1 tablet (1 tablet Oral Given 03/25/21 1446)  oxyCODONE (Oxy IR/ROXICODONE) immediate release tablet 5 mg (5 mg Oral Given 03/25/21 1912)    ED Course  I have reviewed the triage vital signs and the nursing notes.  Pertinent labs & imaging results that were available during my care of the patient were reviewed by me and considered in my medical decision making (see chart for details).    MDM Rules/Calculators/A&P                           MDM  MSE complete  TIVIS WHERRY was evaluated in Emergency Department on 03/25/2021 for the symptoms described in the history of present illness. He was evaluated in the context of the global COVID-19 pandemic, which necessitated consideration that the patient might be at risk for infection with the SARS-CoV-2 virus that causes COVID-19. Institutional protocols and algorithms that pertain to the evaluation of patients at risk for COVID-19 are in a state of rapid change based on information released by regulatory bodies including the CDC and federal and state organizations. These policies and algorithms were followed during the patient's care in the ED.  Patient is presenting with complaint at site of recent sternotomy which required revision.  Patient has had significant discomfort related to his sternotomy is in the recent past.  Patient is currently without home narcotics for management of this pain.  X-ray today reveals some interval movement of the inferior most  sternal wire.  Patient is localizing his pain to this area on exam.  Patient does have resources and knowledge to follow-up closely with Dr. Cliffton Asters in the outpatient setting.  Patient is agreeable to use of oral narcotics for the next 2 to 3 days until he can follow-up with Dr. Cliffton Asters in the outpatient setting.  Patient's work-up otherwise is without significant abnormality.  Patient now desires discharge home.  Importance of close follow-up is strongly recommended.   Final Clinical Impression(s) / ED Diagnoses Final diagnoses:  Chest pain, unspecified type    Rx / DC Orders ED Discharge Orders          Ordered    oxyCODONE (ROXICODONE) 5 MG immediate release tablet  Every 6 hours PRN        03/25/21 1931             Wynetta Fines, MD 03/25/21 1932

## 2021-03-28 ENCOUNTER — Telehealth: Payer: Self-pay | Admitting: Licensed Clinical Social Worker

## 2021-03-28 ENCOUNTER — Telehealth: Payer: Self-pay | Admitting: Thoracic Surgery (Cardiothoracic Vascular Surgery)

## 2021-03-28 ENCOUNTER — Other Ambulatory Visit: Payer: Self-pay

## 2021-03-28 NOTE — Telephone Encounter (Signed)
Pt returned my calls today, he shares that he has been having ongoing pain in his chest. He went to ER on 11/1 and was prescribed medication to [facility] but doesn't have a ride to get there. Before I was able to assist with additional ride service support pt had found someone to pick up medication, he thanked me for my assistance. Encouraged him to f/u with Dr. Lucilla Lame office.   [name], MSW, LCSW [facility] Health Heart/Vascular Care Navigation  [phone]

## 2021-03-31 ENCOUNTER — Telehealth: Payer: Self-pay | Admitting: Licensed Clinical Social Worker

## 2021-03-31 NOTE — Telephone Encounter (Signed)
LCSW noted that pt has only been approved for Medicaid Family Planning at this time- confirmed on [employer]. I mailed pt new copy of [facility] Financial Aid application and noted which documents he needs to gather in order to complete/return it. I faxed in signed [id number] to request letter of nonfiling be mailed to pt. Remain available for assistance with application as needed by pt.   [name], MSW, LCSW [facility] Health Heart/Vascular Care Navigation  [phone]

## 2021-04-07 ENCOUNTER — Telehealth: Payer: Self-pay | Admitting: Licensed Clinical Social Worker

## 2021-04-07 NOTE — Telephone Encounter (Signed)
Received a call from pt this afternoon from his mother's number [phone]. He states that he can be reached at that number. We discussed that he was only approved at this time for [facility]. I walked him through the [facility] Financial Aid application and what he needs to gather for supporting documents. He is aware to keep his eyes open for the letter of nonfiling from the IRS. I encouraged him to reschedule his appt w/ [facility] to establish as a PCP pt and to let me know when he has gathered needed documents.   [name], MSW, LCSW [facility] Health Heart/Vascular Care Navigation  [phone]

## 2021-04-15 ENCOUNTER — Telehealth: Payer: Self-pay | Admitting: Licensed Clinical Social Worker

## 2021-04-15 NOTE — Telephone Encounter (Signed)
Pt called this Clinical research associate concerned b/c he called Social Security and they state he does not have an application for disability on file with them. Unfortunately, I sent referral to [facility] on 01/03/21 and pt had shared after that he had spoken with their staff. At this time I have no way to verify what has and has not been submitted. I direct him to contact [name] at [facility] and provided him with her number. I have also sent an email.   He also states that he hasn't received a determination from Medicaid, however I think he had in the form of Medicaid Family Planning card/letter. I provided him [facility] DSS caseworker name and he will call her as well to verify.   LCSW will f/u with pt next week if I don't hear back from him before offices closed for the holiday.   [name], MSW, LCSW Clinical Social Worker II [facility] Navigation  [phone]- work cell phone (preferred) [phone]- desk phone

## 2021-04-16 NOTE — Telephone Encounter (Signed)
Received the following email back from Thermon Leyland; Disability Specialist at [facility].  Hi Rutherford Nail,  [place]'s claim has been filed and mailed out by [employer].  The problem was he had already filed a claim and neglected to let me know back in August.  I had to file a new claim so he should be receiving paperwork from Social Security soon to start the process again.  Have a great Thanksgiving! Thermon Leyland, DAP Case Manager    [name], MSW, LCSW Clinical Social Worker II [facility] Navigation  [phone]- work cell phone (preferred) [phone]- desk phone

## 2021-04-21 ENCOUNTER — Telehealth: Payer: Self-pay | Admitting: Licensed Clinical Social Worker

## 2021-04-21 NOTE — Telephone Encounter (Signed)
LCSW received call from pt this morning. He shares that he has not heard from [place]. LCSW shared that it may take her > 48 hrs to return calls based on volume of case load. I shared the message she had sent me last Wednesday and that pt should expect paperwork from [facility]. I reconfirmed Medicaid claim information (contact number at [employer] and case ID #). I encouraged him to keep his eyes open for that information and call Cecille Rubin if any questions once received.   Pt states that the surgeon is "ignoring his calls." LCSW inquired when the last time was that he called; he states he called after his visit to ED on 11/1. It appears he had a virtual appt scheduled and completed on 11/4 but no notes associated. LCSW encouraged him to call again and reminded him that sometimes f/u can be helpful especially around the holidays for busy offices. Apologized that he feels that his needs are not being met currently. I also encouraged him to call and get rescheduled w/ [facility] primary care. I provided him w/ the prior appt date and the number to get rescheduled. Encouraged him to call today/tomorrow morning.  Remain available as needed.   Westley Hummer, MSW, [place]  [phone]- work cell phone (preferred) [phone]- desk phone

## 2021-04-21 NOTE — Telephone Encounter (Signed)
LCSW received additional call/voicemail from pt this afternoon, he shares that he received the packet from the [facility] but he "can't get in touch with [name] and doesn't know what to do." LCSW attempted to reach pt after receiving voicemail. No answer, voicemail not set up at [phone]. I texted and let him know to give [name] some time to return call. Asked for him to let me know if still no update after Thursday from [place].   Matthew Tapia, MSW, LCSW Clinical Social Worker II [facility] Navigation  [phone]- work cell phone (preferred) [phone]- desk phone

## 2021-04-25 ENCOUNTER — Telehealth: Payer: Self-pay | Admitting: Licensed Clinical Social Worker

## 2021-04-25 NOTE — Telephone Encounter (Signed)
LCSW received the following text messages from pt. "I have still not heard anything from [facility] and I dont know what to do with this paperwork. If I don't get some help soon with my disability I'm going to have to have to find somewhere to live my mom is 56 years old and she on a fixed income and doesn't make enough to support me."   LCSW unfortunately not sure what paperwork pt has received from [employer] administration. I responded the following: "I'm sorry she has not gotten back with you. She is aware you have the paperwork and I have provided her with your number, she responded back to me only thank you for letting her know. Unfortunately, I don't have any way to get things expedited/have her respond more quickly. I encourage you to call again too and leave a reminder message for her. Her number is [phone] extension [phone]. The executive director is extension 102 if you'd like to speak with her."  [name], MSW, LCSW Clinical Social Worker II [facility] Navigation  [phone]- work cell phone (preferred) [phone]- desk phone

## 2021-04-25 NOTE — Telephone Encounter (Signed)
Additional messages received from pt stating "I dont want to sound like I'm being a bad person about this that's just not me but all this pain I have been going through I really have to take a deep breath sometiems so I dont act like that because again that's not me I'm happy go lucky I would rather make you smile than be sad."   LCSW responded providing support for frustration and encouragement to call and if he does not hear a response within 48 hours to call back no matter if its an agency or a doctors office. Provided context that many nonprofits and offices are experiencing staffing challenges and they are getting to people as soon as possible. Does not hurt to call again. Pt has been encouraged multiple times to call and reschedule the PCP appt he no showed with [facility] and call [facility] if discomfort persists. Pt acknowledged message and states he will call office.   [name], MSW, LCSW Clinical Social Worker II [facility] Navigation  [phone]- work cell phone (preferred) [phone]- desk phone

## 2021-05-02 ENCOUNTER — Telehealth: Payer: Self-pay | Admitting: Licensed Clinical Social Worker

## 2021-05-02 NOTE — Telephone Encounter (Signed)
LCSW received update from pt that he had an appt with [facility] yesterday to complete disability application paperwork with their team- he confirmed w/ me this morning that the appointment was helpful and he feels that he had his concerns addressed. I remain available for any additional questions/concerns.   [name], MSW, LCSW Clinical Social Worker II [facility] Navigation  [phone]- work cell phone (preferred) [phone]- desk phone

## 2021-05-20 ENCOUNTER — Telehealth: Payer: Self-pay | Admitting: Licensed Clinical Social Worker

## 2021-05-20 NOTE — Telephone Encounter (Signed)
LCSW received call and text from Matthew Tapia this morning. He shares he received confirmation of enrollment in Medicaid. LCSW able to confirm coverage on [employer]. Unfortunately, Matthew Tapia enrolled in [employer] which is not in network with [place] (confirmed w/ Bertram Gala, [facility] business office). LCSW has sent Matthew Tapia instructions on how to find in network provider or if interested to change his managed medicaid plan to one of the in network options if he wants to remain w/ [facility]. I have mailed these to his home address at his request (see letter details in this encounter), I remain available as needed.   [name], MSW, LCSW Clinical Social Worker II [facility] Navigation  [phone]- work cell phone (preferred) [phone]- desk phone

## 2021-05-20 NOTE — Telephone Encounter (Signed)
[name], Matthew Tapia were approved for [facility]  Complete Medicaid.   At this time, it is not in network with [facility] per our billing department. This does not mean you have to change your plan- instead you can contact [place] [employer] at their member services at [phone]. They can direct you to an in-network system. If you would like to maintain your [place] providers you can elect to change to one of our in-network options ([facility], [facility] [place], and [place]).  You can see each of the plan's benefits in the attached packet from the [employer]. I am unable to advise you on whether to keep the assigned insurance or to switch to an in network plan. If you do switch then I also cannot advise you which plan to pick, so I recommend you read over the choices and select one that you feel most comfortable with. If you have any questions please let me know!  Hope your holidays were restful!   [name], MSW, LCSW Clinical Social Worker II [facility] [phone]

## 2021-07-28 ENCOUNTER — Telehealth: Payer: Self-pay | Admitting: Cardiovascular Disease

## 2021-07-28 NOTE — Telephone Encounter (Signed)
Patient scheduled for 3/16 pre op clearance with [facility] and [facility] wants patient to have echo done prior to appt. ? ?[facility] will send notes/ order .   ?

## 2021-07-29 NOTE — Telephone Encounter (Signed)
Attempted to schedule no ans no vm ? ? ?Paper order placed in tech box for review.  ?

## 2021-07-29 NOTE — Telephone Encounter (Signed)
Attempted to schedule.  LMOV to call office.  ° °

## 2021-07-30 ENCOUNTER — Other Ambulatory Visit: Payer: Self-pay

## 2021-07-30 ENCOUNTER — Other Ambulatory Visit: Payer: Self-pay | Admitting: Cardiothoracic Surgery

## 2021-07-30 ENCOUNTER — Ambulatory Visit (INDEPENDENT_AMBULATORY_CARE_PROVIDER_SITE_OTHER): Payer: Medicaid Other

## 2021-07-30 DIAGNOSIS — I2510 Atherosclerotic heart disease of native coronary artery without angina pectoris: Secondary | ICD-10-CM

## 2021-07-30 MED ORDER — PERFLUTREN LIPID MICROSPHERE
1.0000 mL | INTRAVENOUS | Status: AC | PRN
  Administered 2021-07-30: 2 mL via INTRAVENOUS

## 2021-07-31 LAB — ECHOCARDIOGRAM COMPLETE
AR max vel: 2.33 cm2
AV Area VTI: 2.52 cm2
AV Area mean vel: 2.4 cm2
AV Mean grad: 5 mmHg
AV Peak grad: 7.8 mmHg
Ao pk vel: 1.4 m/s
Area-P 1/2: 3.61 cm2
Calc EF: 49.5 %
MV M vel: 4.97 m/s
MV Peak grad: 98.8 mmHg
S' Lateral: 4.9 cm
Single Plane A2C EF: 48.2 %
Single Plane A4C EF: 52.9 %

## 2021-08-07 ENCOUNTER — Ambulatory Visit: Payer: Self-pay | Admitting: Cardiovascular Disease

## 2021-08-07 NOTE — Progress Notes (Deleted)
?  ?Cardiology Office Note ? ? ?Date:  08/07/2021  ? ?ID:  Matthew Tapia, DOB [birth year], MRN [id number] ? ?PCP:  Mechele Claude, FNP  ?Cardiologist:   Kathlyn Sacramento, MD  ? ?No chief complaint on file. ? ? ?  ?History of Present Illness: ?Matthew Tapia is a 56 y.o. male who presents for a follow-up visit regarding coronary artery disease. ?He has known history of substance abuse including cocaine and amphetamine, anxiety, chronic pain, essential hypertension, hyperlipidemia and tobacco use. ?He was hospitalized in December 2021 with inferior ST elevation myocardial infarction with late presentation.  Emergent cardiac catheterization showed thrombotic occlusion of the right coronary artery with massive amount of thrombus throughout the proximal to distal segment with left-to-right collaterals.  In addition, there was high-grade stenosis in the mid left circumflex at the bifurcation of OM 2 and significant proximal LAD disease by FFR.  EF was 30 to 35%.  I performed successful aspiration thrombectomy and balloon angioplasty to the right coronary artery with reestablishment of TIMI II flow.  No stenting was performed on the patient was given Aggrastat infusion for 48 hours as well as dual antiplatelet therapy.  Relook angiography showed significant improvement in the RCA with TIMI-3 flow of the area was still diffuse disease.  The patient was referred for CABG but he reoccluded his RCA before surgery when Aggrastat was held.  Balloon angioplasty was performed again and ultimately the patient underwent CABG. ?His echocardiogram on presentation showed an EF of 35 to 40%.  Subsequent echo in May of 2022 showed an EF of 40 to 45%. ?He was hospitalized in May with mental status change.  MRI showed patchy multifocal acute ischemic infarcts of the posterior left temporoparietal region and left basal ganglia with associated mild petechiae without frank hemorrhage.  He was also dehydrated and improved with IV fluids.   Urine tox screen was negative. ? ?Subsequently, he had multiple presentations with chest pain related to his sternal wound.  He underwent sternal plating in August. ? ?Past Medical History:  ?Diagnosis Date  ? Anxiety   ? Asthma   ? CAD, multiple vessel   ? CHF (congestive heart failure) ([place])   ? Dyslipidemia, goal LDL below 70   ? Former smoker   ? Quit 2021  ? HFrEF (heart failure with reduced ejection fraction) ([place])   ? Hypertension   ? Polysubstance abuse ([place])   ? UDS positive for cocaine, amphetamines, benzos 05/15/20  ? ST elevation myocardial infarction (STEMI) of inferior wall ([place])   ? Stroke [facility]) 10/03/2020  ? ? ?Past Surgical History:  ?Procedure Laterality Date  ? CARDIAC CATHETERIZATION    ? CORONARY ANGIOGRAPHY N/A 05/20/2020  ? Procedure: CORONARY ANGIOGRAPHY;  Surgeon: Burnell Blanks, MD;  Location: [place] [facility];  Service: Cardiovascular;  Laterality: N/A;  ? CORONARY ARTERY BYPASS GRAFT N/A 05/21/2020  ? Procedure: CORONARY ARTERY BYPASS GRAFTING TIMES FOUR USING LEFT INTERNAL MAMMARY ARTERY  AND ENDOSCOPICALLY HARVESTED RIGHT GREATER SAPHANOUS VEIN.;  Surgeon: Lajuana Matte, MD;  Location: [place];  Service: Open Heart Surgery;  Laterality: N/A;  flow track  ? CORONARY THROMBECTOMY N/A 05/20/2020  ? Procedure: Coronary Thrombectomy;  Surgeon: Burnell Blanks, MD;  Location: [place] [facility];  Service: Cardiovascular;  Laterality: N/A;  ? CORONARY/GRAFT ACUTE MI REVASCULARIZATION N/A 05/14/2020  ? Procedure: Coronary/Graft Acute MI Revascularization;  Surgeon: Wellington Hampshire, MD;  Location: [place] [facility];  Service: Cardiovascular;  Laterality: N/A;  ?  CORONARY/GRAFT ACUTE MI REVASCULARIZATION N/A 05/20/2020  ? Procedure: Coronary/Graft Acute MI Revascularization;  Surgeon: Burnell Blanks, MD;  Location: [place] [facility];  Service: Cardiovascular;  Laterality: N/A;  ? ENDOVEIN HARVEST OF GREATER SAPHENOUS VEIN Right 05/21/2020  ? Procedure:  ENDOVEIN HARVEST OF RIGHT GREATER SAPHENOUS VEIN;  Surgeon: Lajuana Matte, MD;  Location: [place];  Service: Open Heart Surgery;  Laterality: Right;  ? INTRAVASCULAR PRESSURE WIRE/FFR STUDY N/A 05/16/2020  ? Procedure: INTRAVASCULAR PRESSURE WIRE/FFR STUDY;  Surgeon: Wellington Hampshire, MD;  Location: [place] [facility];  Service: Cardiovascular;  Laterality: N/A;  ? LEFT HEART CATH AND CORONARY ANGIOGRAPHY N/A 05/14/2020  ? Procedure: LEFT HEART CATH AND CORONARY ANGIOGRAPHY;  Surgeon: Wellington Hampshire, MD;  Location: [place] [facility];  Service: Cardiovascular;  Laterality: N/A;  ? LEFT HEART CATH AND CORONARY ANGIOGRAPHY N/A 05/16/2020  ? Procedure: LEFT HEART CATH AND CORONARY ANGIOGRAPHY;  Surgeon: Wellington Hampshire, MD;  Location: [place] [facility];  Service: Cardiovascular;  Laterality: N/A;  ? RIB PLATING N/A 01/06/2021  ? Procedure: STERNAL PLATING;  Surgeon: Lajuana Matte, MD;  Location: [place];  Service: Thoracic;  Laterality: N/A;  ? SHOULDER SURGERY    ? TEE WITHOUT CARDIOVERSION N/A 05/21/2020  ? Procedure: TRANSESOPHAGEAL ECHOCARDIOGRAM (TEE);  Surgeon: Lajuana Matte, MD;  Location: [place];  Service: Open Heart Surgery;  Laterality: N/A;  ? ? ? ?Current Outpatient Medications  ?Medication Sig Dispense Refill  ? aspirin EC 81 MG tablet Take 1 tablet (81 mg total) by once mouth daily. Swallow whole. 90 tablet 1  ? atorvastatin (LIPITOR) 80 MG tablet Take 1 tablet (80 mg total) by mouth once daily. 90 tablet 1  ? carvedilol (COREG) 6.25 MG tablet Take 1 tablet (6.25 mg total) by mouth 2 (two) times daily. 180 tablet 1  ? cephALEXin (KEFLEX) 500 MG capsule Take 1 capsule (500 mg total) by mouth 3 (three) times daily for 7 days. 21 capsule 0  ? furosemide (LASIX) 20 MG tablet Take 1 tablet (20 mg total) by mouth once daily. 180 tablet 1  ? ibuprofen (ADVIL) 200 MG tablet Take 4 tablets (800 mg total) by mouth every 8 (eight) hours as needed for mild pain or moderate pain. 30 tablet  0  ? losartan (COZAAR) 25 MG tablet Take 1 tablet (25 mg total) by mouth once daily. 90 tablet 1  ? oxyCODONE (ROXICODONE) 5 MG immediate release tablet Take 1 tablet (5 mg total) by mouth every 6 (six) hours as needed for severe pain. 12 tablet 0  ? oxyCODONE 10 MG TABS Take 1 tablet (10 mg total) by mouth every 6 (six) hours as needed for severe pain. 28 tablet 0  ? polyethylene glycol (MIRALAX / GLYCOLAX) 17 g packet Take 17 g by mouth daily. 14 each 0  ? ?No current facility-administered medications for this visit.  ? ? ?Allergies:   Dilaudid [hydromorphone hcl]  ? ? ?Social History:  The patient  reports that he has been smoking cigarettes. He has a 10.50 pack-year smoking history. He quit smokeless tobacco use about 23 years ago.  His smokeless tobacco use included chew. He reports that he does not currently use drugs. He reports that he does not drink alcohol.  ? ?Family History:  The patient's family history includes Heart attack in his father.  ? ? ?ROS:  Please see the history of present illness.   Otherwise, review of systems are positive for none.  All other systems are reviewed and negative.  ? ? ?PHYSICAL EXAM: ?VS:  There were no vitals taken for this visit. , BMI There is no height or weight on file to calculate BMI. ?GEN: Well nourished, well developed, in no acute distress  ?HEENT: normal  ?Neck: no JVD, carotid bruits, or masses ?Cardiac: RRR; no murmurs, rubs, or gallops,no edema  ?Respiratory:  clear to auscultation bilaterally, normal work of breathing ?GI: soft, nontender, nondistended, + BS ?MS: no deformity or atrophy  ?Skin: warm and dry, no rash ?Neuro:  Strength and sensation are intact ?Psych: euthymic mood, full affect ? ? ?EKG:  EKG is ordered today. ?The ekg ordered today demonstrates sinus rhythm with old inferior infarct. ? ? ?Recent Labs: ?10/03/2020: TSH 0.791 ?10/04/2020: Magnesium 2.2 ?01/15/2021: ALT 21; B Natriuretic Peptide 205.5 ?03/25/2021: BUN 10; Creatinine, Ser 0.81;  Hemoglobin 14.4; Platelets 286; Potassium 3.8; Sodium 135  ? ? ?Lipid Panel ?   ?Component Value Date/Time  ? CHOL 168 12/27/2020 1531  ? TRIG 309 (H) 12/27/2020 1531  ? HDL 37 (L) 12/27/2020 1531  ? CHOLHDL 4.5 08/

## 2021-08-08 ENCOUNTER — Encounter: Payer: Self-pay | Admitting: Cardiovascular Disease

## 2021-09-26 ENCOUNTER — Other Ambulatory Visit: Payer: Self-pay

## 2021-12-12 ENCOUNTER — Encounter: Payer: Self-pay | Admitting: Cardiovascular Disease

## 2021-12-12 ENCOUNTER — Other Ambulatory Visit: Payer: Self-pay | Admitting: *Deleted

## 2021-12-12 ENCOUNTER — Ambulatory Visit (INDEPENDENT_AMBULATORY_CARE_PROVIDER_SITE_OTHER): Payer: Medicaid Other | Admitting: Cardiovascular Disease

## 2021-12-12 VITALS — BP 126/82 | HR 81 | Ht 67.0 in | Wt 250.0 lb

## 2021-12-12 DIAGNOSIS — I255 Ischemic cardiomyopathy: Secondary | ICD-10-CM | POA: Diagnosis not present

## 2021-12-12 DIAGNOSIS — E782 Mixed hyperlipidemia: Secondary | ICD-10-CM

## 2021-12-12 DIAGNOSIS — I5020 Unspecified systolic (congestive) heart failure: Secondary | ICD-10-CM | POA: Diagnosis not present

## 2021-12-12 DIAGNOSIS — R079 Chest pain, unspecified: Secondary | ICD-10-CM | POA: Diagnosis not present

## 2021-12-12 DIAGNOSIS — I2510 Atherosclerotic heart disease of native coronary artery without angina pectoris: Secondary | ICD-10-CM | POA: Diagnosis not present

## 2021-12-12 MED ORDER — ATORVASTATIN CALCIUM 80 MG PO TABS: 80.0000 mg | ORAL_TABLET | Freq: Every day | ORAL | 6 refills | Status: DC

## 2021-12-12 MED ORDER — CARVEDILOL 6.25 MG PO TABS: 6.2500 mg | ORAL_TABLET | Freq: Two times a day (BID) | ORAL | 6 refills | Status: DC

## 2021-12-12 MED ORDER — FUROSEMIDE 20 MG PO TABS: 20.0000 mg | ORAL_TABLET | Freq: Every day | ORAL | 6 refills | Status: DC

## 2021-12-12 MED ORDER — LOSARTAN POTASSIUM 25 MG PO TABS: 25.0000 mg | ORAL_TABLET | Freq: Every day | ORAL | 6 refills | Status: DC

## 2021-12-12 NOTE — Progress Notes (Unsigned)
Cardiology Office Note   Date:  12/12/2021   ID:  Matthew Tapia, DOB [birth year], MRN [id number]  PCP:  Miki Kins, FNP  Cardiologist:   Lorine Bears, MD   Chief Complaint  Patient presents with   Follow-up   Chest Pain    Soreness as well.      History of Present Illness: Matthew Tapia is a 56 y.o. male who presents for a follow-up visit regarding coronary artery disease. He has known history of substance abuse including cocaine and amphetamine, anxiety, chronic pain, essential hypertension, hyperlipidemia and tobacco use. He was hospitalized in December with inferior ST elevation myocardial infarction with late presentation.  Emergent cardiac catheterization showed thrombotic occlusion of the right coronary artery with massive amount of thrombus throughout the proximal to distal segment with left-to-right collaterals.  In addition, there was high-grade stenosis in the mid left circumflex at the bifurcation of OM 2 and significant proximal LAD disease by FFR.  EF was 30 to 35%.  I performed successful aspiration thrombectomy and balloon angioplasty to the right coronary artery with reestablishment of TIMI II flow.  No stenting was performed on the patient was given Aggrastat infusion for 48 hours as well as dual antiplatelet therapy.  Relook angiography showed significant improvement in the RCA with TIMI-3 flow of the area was still diffuse disease.  The patient was referred for CABG but he reoccluded his RCA before surgery went Aggrastat was held.  Balloon angioplasty was performed again and ultimately the patient underwent CABG. His echocardiogram on presentation showed an EF of 35 to 40%.  Subsequent echo in May showed an EF of 40 to 45%. He was hospitalized in May with mental status change.  MRI showed patchy multifocal acute ischemic infarcts of the posterior left temporoparietal region and left basal ganglia with associated mild petechiae without frank hemorrhage.  He was  also dehydrated and improved with IV fluids.  Urine tox screen was negative. He went to the emergency room recently with chest pain.  His troponin was normal.  The chest pain is at the sternal and there is some popping noise in the lower part of the sternum.  It gets worse with coughing and deep breath. Unfortunately, he lost his job and has not been taking any of his medications as he cannot afford them.  He cut down on tobacco use to one fourth of a pack per day.  He denies any drug use since his CABG.  Past Medical History:  Diagnosis Date   Anxiety    Asthma    CAD, multiple vessel    CHF (congestive heart failure) (H[facility])    Dyslipidemia, goal LDL below 70    Former smoker    Quit 2021   HFrEF (heart failure with reduced ejection fraction) (H[facility])    Hypertension    Polysubstance abuse ([facility])    UDS positive for cocaine, amphetamines, benzos 05/15/20   ST elevation myocardial infarction (STEMI) of inferior wall (H[facility])    Stroke ([facility]) 10/03/2020    Past Surgical History:  Procedure Laterality Date   CARDIAC CATHETERIZATION     CORONARY ANGIOGRAPHY N/A 05/20/2020   Procedure: CORONARY ANGIOGRAPHY;  Surgeon: Kathleene Hazel, MD;  [facility];  Service: Cardiovascular;  Laterality: N/A;   CORONARY ARTERY BYPASS GRAFT N/A 05/21/2020   Procedure: CORONARY ARTERY BYPASS GRAFTING TIMES FOUR USING LEFT INTERNAL MAMMARY ARTERY  AND ENDOSCOPICALLY HARVESTED RIGHT GREATER SAPHANOUS VEIN.;  Surgeon: Corliss Skains, MD;  Location: [facility];  Service: Open Heart Surgery;  Laterality: N/A;  flow track   CORONARY THROMBECTOMY N/A 05/20/2020   Procedure: Coronary Thrombectomy;  Surgeon: Kathleene Hazel, MD;  [facility];  Service: Cardiovascular;  Laterality: N/A;   CORONARY/GRAFT ACUTE MI REVASCULARIZATION N/A 05/14/2020   Procedure: Coronary/Graft Acute MI Revascularization;  Surgeon: Iran Ouch, MD;  Location: [facility];  Service:  Cardiovascular;  Laterality: N/A;   CORONARY/GRAFT ACUTE MI REVASCULARIZATION N/A 05/20/2020   Procedure: Coronary/Graft Acute MI Revascularization;  Surgeon: Kathleene Hazel, MD;  [facility];  Service: Cardiovascular;  Laterality: N/A;   ENDOVEIN HARVEST OF GREATER SAPHENOUS VEIN Right 05/21/2020   Procedure: ENDOVEIN HARVEST OF RIGHT GREATER SAPHENOUS VEIN;  Surgeon: Corliss Skains, MD;  [facility];  Service: Open Heart Surgery;  Laterality: Right;   INTRAVASCULAR PRESSURE WIRE/FFR STUDY N/A 05/16/2020   Procedure: INTRAVASCULAR PRESSURE WIRE/FFR STUDY;  Surgeon: Iran Ouch, MD;  Location: [facility];  Service: Cardiovascular;  Laterality: N/A;   LEFT HEART CATH AND CORONARY ANGIOGRAPHY N/A 05/14/2020   Procedure: LEFT HEART CATH AND CORONARY ANGIOGRAPHY;  Surgeon: Iran Ouch, MD;  Location: [facility];  Service: Cardiovascular;  Laterality: N/A;   LEFT HEART CATH AND CORONARY ANGIOGRAPHY N/A 05/16/2020   Procedure: LEFT HEART CATH AND CORONARY ANGIOGRAPHY;  Surgeon: Iran Ouch, MD;  Location: [facility];  Service: Cardiovascular;  Laterality: N/A;   RIB PLATING N/A 01/06/2021   Procedure: STERNAL PLATING;  Surgeon: Corliss Skains, MD;  [facility];  Service: Thoracic;  Laterality: N/A;   SHOULDER SURGERY     TEE WITHOUT CARDIOVERSION N/A 05/21/2020   Procedure: TRANSESOPHAGEAL ECHOCARDIOGRAM (TEE);  Surgeon: Corliss Skains, MD;  Location[facility];  Service: Open Heart Surgery;  Laterality: N/A;     Current Outpatient Medications  Medication Sig Dispense Refill   aspirin EC 81 MG tablet Take 1 tablet (81 mg total) by once mouth daily. Swallow whole. 90 tablet 1   cephALEXin (KEFLEX) 500 MG capsule Take 1 capsule (500 mg total) by mouth 3 (three) times daily for 7 days. 21 capsule 0   ibuprofen (ADVIL) 200 MG tablet Take 4 tablets (800 mg total) by mouth every 8 (eight) hours as needed for  mild pain or moderate pain. 30 tablet 0   oxyCODONE (ROXICODONE) 5 MG immediate release tablet Take 1 tablet (5 mg total) by mouth every 6 (six) hours as needed for severe pain. 12 tablet 0   oxyCODONE 10 MG TABS Take 1 tablet (10 mg total) by mouth every 6 (six) hours as needed for severe pain. 28 tablet 0   polyethylene glycol (MIRALAX / GLYCOLAX) 17 g packet Take 17 g by mouth daily. 14 each 0   atorvastatin (LIPITOR) 80 MG tablet Take 1 tablet (80 mg total) by mouth daily. 30 tablet 6   carvedilol (COREG) 6.25 MG tablet Take 1 tablet (6.25 mg total) by mouth 2 (two) times daily. 60 tablet 6   furosemide (LASIX) 20 MG tablet Take 1 tablet (20 mg total) by mouth once daily. 60 tablet 6   losartan (COZAAR) 25 MG tablet Take 1 tablet (25 mg total) by mouth daily. 30 tablet 6   No current facility-administered medications for this visit.    Allergies:   Dilaudid [hydromorphone hcl]    Social History:  The patient  reports that he has been smoking cigarettes. He has a  10.50 pack-year smoking history. He quit smokeless tobacco use about 23 years ago.  His smokeless tobacco use included chew. He reports that he does not currently use drugs. He reports that he does not drink alcohol.   Family History:  The patient's family history includes Heart attack in his father.    ROS:  Please see the history of present illness.   Otherwise, review of systems are positive for none.   All other systems are reviewed and negative.    PHYSICAL EXAM: VS:  BP 126/82 (BP Location: Left Arm, Patient Position: Sitting, Cuff Size: Large)   Pulse 81   Ht 5\' 7"  (1.702 m)   Wt 250 lb (113.4 kg)   BMI 39.16 kg/m  , BMI Body mass index is 39.16 kg/m. GEN: Well nourished, well developed, in no acute distress  HEENT: normal  Neck: no JVD, carotid bruits, or masses Cardiac: RRR; no murmurs, rubs, or gallops,no edema  Respiratory:  clear to auscultation bilaterally, normal work of breathing GI: soft, nontender,  nondistended, + BS MS: no deformity or atrophy  Skin: warm and dry, no rash Neuro:  Strength and sensation are intact Psych: euthymic mood, full affect   EKG:  EKG is ordered today. The ekg ordered today demonstrates sinus rhythm with old inferior infarct.   Recent Labs: 01/15/2021: ALT 21; B Natriuretic Peptide 205.5 03/25/2021: BUN 10; Creatinine, Ser 0.81; Hemoglobin 14.4; Platelets 286; Potassium 3.8; Sodium 135    Lipid Panel    Component Value Date/Time   CHOL 168 12/27/2020 1531   TRIG 309 (H) 12/27/2020 1531   HDL 37 (L) 12/27/2020 1531   CHOLHDL 4.5 12/27/2020 1531   VLDL 62 (H) 12/27/2020 1531   LDLCALC 69 12/27/2020 1531   LDLDIRECT 139.3 (H) 10/04/2020 1131      Wt Readings from Last 3 Encounters:  12/12/21 250 lb (113.4 kg)  03/25/21 245 lb (111.1 kg)  02/21/21 245 lb (111.1 kg)           No data to display            ASSESSMENT AND PLAN:  1.  Coronary artery disease involving native coronary arteries: No convincing symptoms of angina.  His current chest pain is clearly musculoskeletal.  I recommend continuing medical therapy.  We have to resume his heart medications but he is limited by finances.  Will refer him to medication management for at least the short-term.  2.  Chronic systolic heart failure due to ischemic cardiomyopathy: He appears to be euvolemic.  I am going to start him on small dose carvedilol and losartan.  Ultimately, we will consider switching him to [facility].  3.  Hyperlipidemia: I resumed atorvastatin 80 mg daily.  4.  Musculoskeletal chest pain: This seems to be due to previous CABG.  I am not entirely sure his sternum has healed completely.  This seems to be aggravated by his chronic cough.  I asked him to start using cough syrup.  In addition, I agreed to give him 1 refill on oxycodone.  I gave him 15 tablets and instructed him that I will not be able to give any further refills of this.    5.  Recent stroke: Stable at this  time.  The patient has multiple chronic severe medical conditions.  I encouraged him to apply for disability and Medicaid.    Disposition:   FU in 3 months.  Signed,  [facility], MD  12/12/2021 3:24 PM    [facility] 

## 2021-12-12 NOTE — Patient Instructions (Addendum)
Medication Instructions:  NONE *If you need a refill on your cardiac medications before your next appointment, please call your pharmacy*   Lab Work: NONE If you have labs (blood work) drawn today and your tests are completely normal, you will receive your results only by: MyChart Message (if you have MyChart) OR A paper copy in the mail If you have any lab test that is abnormal or we need to change your treatment, we will call you to review the results.   Testing/Procedures: NONE   Follow-Up: At [facility], you and your health needs are our priority.  As part of our continuing mission to provide you with exceptional heart care, we have created designated Provider Care Teams.  These Care Teams include your primary Cardiologist (physician) and Advanced Practice Providers (APPs -  Physician Assistants and Nurse Practitioners) who all work together to provide you with the care you need, when you need it.  We recommend signing up for the patient portal called "MyChart".  Sign up information is provided on this After Visit Summary.  MyChart is used to connect with patients for Virtual Visits (Telemedicine).  Patients are able to view lab/test results, encounter notes, upcoming appointments, etc.  Non-urgent messages can be sent to your provider as well.   To learn more about what you can do with MyChart, go to [email or web address].    Your next appointment:   6 month(s)  The format for your next appointment:   In Person  Provider:   You may see Lorine Bears, MD or one of the following Advanced Practice Providers on your designated Care Team:   Nicolasa Ducking, NP Eula Listen, PA-C Cadence Fransico Michael, PA-C{   Important Information About Sugar

## 2022-03-31 ENCOUNTER — Other Ambulatory Visit: Payer: Self-pay

## 2022-03-31 ENCOUNTER — Encounter: Payer: Self-pay | Admitting: Emergency Medicine

## 2022-03-31 ENCOUNTER — Emergency Department
Admission: EM | Admit: 2022-03-31 | Discharge: 2022-04-01 | Disposition: A | Discharge status: Home / Self Care | Payer: Medicaid Other | Attending: Emergency Medicine | Admitting: Emergency Medicine

## 2022-03-31 ENCOUNTER — Emergency Department: Payer: Medicaid Other

## 2022-03-31 DIAGNOSIS — I5042 Chronic combined systolic (congestive) and diastolic (congestive) heart failure: Secondary | ICD-10-CM | POA: Insufficient documentation

## 2022-03-31 DIAGNOSIS — I2510 Atherosclerotic heart disease of native coronary artery without angina pectoris: Secondary | ICD-10-CM | POA: Insufficient documentation

## 2022-03-31 DIAGNOSIS — Z20822 Contact with and (suspected) exposure to covid-19: Secondary | ICD-10-CM | POA: Insufficient documentation

## 2022-03-31 DIAGNOSIS — J45909 Unspecified asthma, uncomplicated: Secondary | ICD-10-CM | POA: Insufficient documentation

## 2022-03-31 DIAGNOSIS — Z951 Presence of aortocoronary bypass graft: Secondary | ICD-10-CM | POA: Insufficient documentation

## 2022-03-31 DIAGNOSIS — Z87891 Personal history of nicotine dependence: Secondary | ICD-10-CM | POA: Insufficient documentation

## 2022-03-31 DIAGNOSIS — F1414 Cocaine abuse with cocaine-induced mood disorder: Secondary | ICD-10-CM | POA: Diagnosis not present

## 2022-03-31 DIAGNOSIS — F1490 Cocaine use, unspecified, uncomplicated: Secondary | ICD-10-CM

## 2022-03-31 DIAGNOSIS — R443 Hallucinations, unspecified: Secondary | ICD-10-CM

## 2022-03-31 DIAGNOSIS — I110 Hypertensive heart disease with heart failure: Secondary | ICD-10-CM | POA: Insufficient documentation

## 2022-03-31 DIAGNOSIS — F419 Anxiety disorder, unspecified: Secondary | ICD-10-CM | POA: Diagnosis not present

## 2022-03-31 DIAGNOSIS — R441 Visual hallucinations: Secondary | ICD-10-CM | POA: Diagnosis present

## 2022-03-31 LAB — TROPONIN I (HIGH SENSITIVITY)
Troponin I (High Sensitivity): 17 ng/L (ref <18)
Troponin I (High Sensitivity): 15 ng/L (ref <18)

## 2022-03-31 LAB — COMPREHENSIVE METABOLIC PANEL
ALT: 31 U/L (ref 0–44)
AST: 50 U/L — ABNORMAL HIGH (ref 15–41)
Albumin: 4.1 g/dL (ref 3.5–5.0)
Alkaline Phosphatase: 64 U/L (ref 38–126)
Anion gap: 11 (ref 5–15)
BUN: 12 mg/dL (ref 6–20)
CO2: 20 mmol/L — ABNORMAL LOW (ref 22–32)
Calcium: 9.3 mg/dL (ref 8.9–10.3)
Chloride: 106 mmol/L (ref 98–111)
Creatinine, Ser: 1.02 mg/dL (ref 0.61–1.24)
GFR, Estimated: >60 mL/min (ref >60)
Glucose, Bld: 121 mg/dL — ABNORMAL HIGH (ref 70–99)
Potassium: 3.4 mmol/L — ABNORMAL LOW (ref 3.5–5.1)
Sodium: 137 mmol/L (ref 135–145)
Total Bilirubin: 1.2 mg/dL (ref 0.3–1.2)
Total Protein: 8.2 g/dL — ABNORMAL HIGH (ref 6.5–8.1)

## 2022-03-31 LAB — CBC
HCT: 42.1 % (ref 39.0–52.0)
Hemoglobin: 14.2 g/dL (ref 13.0–17.0)
MCH: 32.2 pg (ref 26.0–34.0)
MCHC: 33.7 g/dL (ref 30.0–36.0)
MCV: 95.5 fL (ref 80.0–100.0)
Platelets: 255 10*3/uL (ref 150–400)
RBC: 4.41 MIL/uL (ref 4.22–5.81)
RDW: 13.1 % (ref 11.5–15.5)
WBC: 10.3 10*3/uL (ref 4.0–10.5)
nRBC: 0 % (ref 0.0–0.2)

## 2022-03-31 LAB — URINE DRUG SCREEN, QUALITATIVE (ARMC ONLY)
Amphetamines, Ur Screen: NOT DETECTED
Barbiturates, Ur Screen: NOT DETECTED
Benzodiazepine, Ur Scrn: NOT DETECTED
Cannabinoid 50 Ng, Ur ~~LOC~~: NOT DETECTED
Cocaine Metabolite,Ur ~~LOC~~: POSITIVE — AB
MDMA (Ecstasy)Ur Screen: NOT DETECTED
Methadone Scn, Ur: NOT DETECTED
Opiate, Ur Screen: NOT DETECTED
Phencyclidine (PCP) Ur S: NOT DETECTED
Tricyclic, Ur Screen: NOT DETECTED

## 2022-03-31 LAB — RESP PANEL BY RT-PCR (FLU A&B, COVID) ARPGX2
Influenza A by PCR: NEGATIVE
Influenza B by PCR: NEGATIVE
SARS Coronavirus 2 by RT PCR: NEGATIVE

## 2022-03-31 LAB — SALICYLATE LEVEL: Salicylate Lvl: <7 mg/dL — ABNORMAL LOW (ref 7.0–30.0)

## 2022-03-31 LAB — ACETAMINOPHEN LEVEL: Acetaminophen (Tylenol), Serum: <10 ug/mL — ABNORMAL LOW (ref 10–30)

## 2022-03-31 LAB — ETHANOL: Alcohol, Ethyl (B): <10 mg/dL (ref <10)

## 2022-03-31 NOTE — ED Notes (Signed)
Pt given a [place] sandwich tray and a cup of sprite per pt request.

## 2022-03-31 NOTE — ED Triage Notes (Signed)
Pt to ED from home under IVC with [place] [employer] c/o hallucinations.  Papers states pt delusional and seeing a rat and chasing it that isn't visible to others, pt was sweating profusely and thought to be on drugs.  Pt denies hallucination claims.  Denies alcohol use and states cocaine last week.  Pt speaking rapidly, unable to sit still and picking at face, also states chest pain.  Pt dressed into hospital appropriate scrubs by this RN and Beth, EDT.  Belongings placed into one bag and kept at hospital.  Contents include: 1 pack cigarettes, 1 red lighter, 1 black phone, 1 yellow t shirt, 1 pair blue shorts, 1 pair black flip flops.

## 2022-03-31 NOTE — ED Notes (Signed)
Pt to bhu with security.  Report off to [place] rn bhu nurse

## 2022-03-31 NOTE — BH Assessment (Signed)
Comprehensive Clinical Assessment (CCA) Note  03/31/2022 [name] [id number] Recommendations for Services/Supports/Treatments: Consulted with Rashaun D., NP, who recommended that the pt be observed and reassessed in the AM. Notified Dr. Sidney Ace and Amy, RN of disposition recommendation.  Matthew Tapia is a 56 year old, English speaking, white male with no known psych hx. Pt has a hx of polysubstance abuse. Per triage note: Pt to ED from home under IVC with [place] [facility] c/o hallucinations.  Papers states pt. delusional and seeing a rat and chasing it that isn't visible to others, pt. was sweating profusely and thought to be on drugs.  Pt denies hallucination claims.  Denies alcohol use and states cocaine last week.  Pt speaking rapidly, unable to sit still and picking at face, also states chest pain.  On assessment, pt. was adamant that he'd saw a rat run into his room which resulted in him frantically moving things out of the room. Pt reported that his sister insisted that there was no rat, reporting that they don't get along. Pt had pressured, tangential speech. Pt denied substance use. Pt had absent insight and impaired judgment. Pt was cooperative and mannerable throughout the assessment. Pt had restless/agitated psychomotor activity. Pt had numerous open wounds on his face and was actively skin picking, causing bleeding. Pt had neglected grooming. Pt had a hypomanic mood and responsive affect. Pt denied SI/HI/AV/H. Pt's UDS + for cocaine.  Chief Complaint:  Chief Complaint  Patient presents with   Hallucinations   Chest Pain   Visit Diagnosis: Anxiety Polysubstance abuse    CCA Screening, Triage and Referral (STR)  Patient Reported Information How did you hear about [place]? No data recorded Referral name: No data recorded Referral phone number: No data recorded  Whom do you see for routine medical problems? No data recorded Practice/Facility Name: No data recorded Practice/Facility  Phone Number: No data recorded Name of Contact: No data recorded Contact Number: No data recorded Contact Fax Number: No data recorded Prescriber Name: No data recorded Prescriber Address (if known): No data recorded  What Is the Reason for Your Visit/Call Today? No data recorded How Long Has This Been Causing You Problems? No data recorded What Do You Feel Would Help You the Most Today? No data recorded  Have You Recently Been in Any Inpatient Treatment (Hospital/Detox/Crisis Center/28-Day Program)? No data recorded Name/Location of Program/Hospital:No data recorded How Long Were You There? No data recorded When Were You Discharged? No data recorded  Have You Ever Received Services From [facility] Before? No data recorded Who Do You See at [facility]? No data recorded  Have You Recently Had Any Thoughts About Hurting Yourself? No data recorded Are You Planning to Commit Suicide/Harm Yourself At This time? No data recorded  Have you Recently Had Thoughts About Hurting Someone Karolee Ohs? No data recorded Explanation: No data recorded  Have You Used Any Alcohol or Drugs in the Past 24 Hours? No data recorded How Long Ago Did You Use Drugs or Alcohol? No data recorded What Did You Use and How Much? No data recorded  Do You Currently Have a Therapist/Psychiatrist? No data recorded Name of Therapist/Psychiatrist: No data recorded  Have You Been Recently Discharged From Any Office Practice or Programs? No data recorded Explanation of Discharge From Practice/Program: No data recorded    CCA Screening Triage Referral Assessment Type of Contact: No data recorded Is this Initial or Reassessment? No data recorded Date Telepsych consult ordered in [facility]:  No data recorded Time Telepsych  consult ordered in [facility]:  No data recorded  Patient Reported Information Reviewed? No data recorded Patient Left Without Being Seen? No data recorded Reason for Not Completing Assessment: No data  recorded  Collateral Involvement: No data recorded  Does Patient Have a Court Appointed Legal Guardian? No data recorded Name and Contact of Legal Guardian: No data recorded If Minor and Not Living with Parent(s), Who has Custody? No data recorded Is CPS involved or ever been involved? No data recorded Is APS involved or ever been involved? No data recorded  Patient Determined To Be At Risk for Harm To Self or Others Based on Review of Patient Reported Information or Presenting Complaint? No data recorded Method: No data recorded Availability of Means: No data recorded Intent: No data recorded Notification Required: No data recorded Additional Information for Danger to Others Potential: No data recorded Additional Comments for Danger to Others Potential: No data recorded Are There Guns or Other Weapons in Your Home? No data recorded Types of Guns/Weapons: No data recorded Are These Weapons Safely Secured?                            No data recorded Who Could Verify You Are Able To Have These Secured: No data recorded Do You Have any Outstanding Charges, Pending Court Dates, Parole/Probation? No data recorded Contacted To Inform of Risk of Harm To Self or Others: No data recorded  Location of Assessment: No data recorded  Does Patient Present under Involuntary Commitment? No data recorded IVC Papers Initial File Date: No data recorded  [place] of Residence: No data recorded  Patient Currently Receiving the Following Services: No data recorded  Determination of Need: No data recorded  Options For Referral: No data recorded    CCA Biopsychosocial Intake/Chief Complaint:  No data recorded Current Symptoms/Problems: No data recorded  Patient Reported Schizophrenia/Schizoaffective Diagnosis in Past: No data recorded  Strengths: No data recorded Preferences: No data recorded Abilities: No data recorded  Type of Services Patient Feels are Needed: No data recorded  Initial  Clinical Notes/Concerns: No data recorded  Mental Health Symptoms Depression:  No data recorded  Duration of Depressive symptoms: No data recorded  Mania:  No data recorded  Anxiety:   No data recorded  Psychosis:  No data recorded  Duration of Psychotic symptoms: No data recorded  Trauma:  No data recorded  Obsessions:  No data recorded  Compulsions:  No data recorded  Inattention:  No data recorded  Hyperactivity/Impulsivity:  No data recorded  Oppositional/Defiant Behaviors:  No data recorded  Emotional Irregularity:  No data recorded  Other Mood/Personality Symptoms:  No data recorded   Mental Status Exam Appearance and self-care  Stature:  No data recorded  Weight:  No data recorded  Clothing:  No data recorded  Grooming:  No data recorded  Cosmetic use:  No data recorded  Posture/gait:  No data recorded  Motor activity:  No data recorded  Sensorium  Attention:  No data recorded  Concentration:  No data recorded  Orientation:  No data recorded  Recall/memory:  No data recorded  Affect and Mood  Affect:  No data recorded  Mood:  No data recorded  Relating  Eye contact:  No data recorded  Facial expression:  No data recorded  Attitude toward examiner:  No data recorded  Thought and Language  Speech flow: No data recorded  Thought content:  No data recorded  Preoccupation:  No  data recorded  Hallucinations:  No data recorded  Organization:  No data recorded  [employer] of Knowledge:  No data recorded  Intelligence:  No data recorded  Abstraction:  No data recorded  Judgement:  No data recorded  Reality Testing:  No data recorded  Insight:  No data recorded  Decision Making:  No data recorded  Social Functioning  Social Maturity:  No data recorded  Social Judgement:  No data recorded  Stress  Stressors:  No data recorded  Coping Ability:  No data recorded  Skill Deficits:  No data recorded  Supports:  No data recorded    Religion:     Leisure/Recreation:    Exercise/Diet:     CCA Employment/Education Employment/Work Situation:    Education:     CCA Family/Childhood History Family and Relationship History:    Childhood History:     Child/Adolescent Assessment:     CCA Substance Use Alcohol/Drug Use:                           ASAM's:  Six Dimensions of Multidimensional Assessment  Dimension 1:  Acute Intoxication and/or Withdrawal Potential:      Dimension 2:  Biomedical Conditions and Complications:      Dimension 3:  Emotional, Behavioral, or Cognitive Conditions and Complications:     Dimension 4:  Readiness to Change:     Dimension 5:  Relapse, Continued use, or Continued Problem Potential:     Dimension 6:  Recovery/Living Environment:     ASAM Severity Score:    ASAM Recommended Level of Treatment:     Substance use Disorder (SUD)    Recommendations for Services/Supports/Treatments:    DSM5 Diagnoses: Patient Active Problem List   Diagnosis Date Noted   Sternal pain 01/15/2021   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   Lactic acidosis 10/04/2020   Dehydration 10/04/2020   Chronic combined systolic and diastolic CHF (congestive heart failure) (HCC) 10/04/2020   Acute encephalopathy 10/03/2020   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

## 2022-03-31 NOTE — ED Notes (Signed)
Pt. To BHU from ED ambulatory without difficulty, to room  BHU 1. Report from Amy RN. Pt. Is alert and oriented, warm and dry in no distress. Pt. Denies SI, HI, and AVH. Pt. Calm and cooperative. Patient states he saw a mouse and he told his sister because they have been having issues with mice. Patient states sister got agitated and made him upset. Pt. Made aware of security cameras and Q15 minute rounds. Pt. Encouraged to let Nursing staff know of any concerns or needs.    ENVIRONMENTAL ASSESSMENT Potentially harmful objects out of patient reach: Yes.   Personal belongings secured: Yes.   Patient dressed in hospital provided attire only: Yes.   Plastic bags out of patient reach: Yes.   Patient care equipment (cords, cables, call bells, lines, and drains) shortened, removed, or accounted for: Yes.   Equipment and supplies removed from bottom of stretcher: Yes.   Potentially toxic materials out of patient reach: Yes.   Sharps container removed or out of patient reach: Yes.

## 2022-03-31 NOTE — ED Provider Notes (Signed)
[facility] Provider Note    Event Date/Time   First MD Initiated Contact with Patient 03/31/22 2029     (approximate)   History   Hallucinations and Chest Pain   HPI  Matthew Tapia is a 56 y.o. male past medical history of polysubstance use disorder, STEMI, triple bypass surgery, heart from who presents under IVC.  Per paperwork patient was delusional seeing things apparently was chasing around around his room that others could not see.  Was found to be profusely sweating and thought to be on drugs.  Patient tells me that there was a mouse loose in his house.  He lives with his sister.  Says they do not get along very well.  He denies drug or alcohol use.  Denies SI or HI     Past Medical History:  Diagnosis Date   Anxiety    Asthma    CAD, multiple vessel    CHF (congestive heart failure) ([place])    Dyslipidemia, goal LDL below 60    Former smoker    Quit 2021   HFrEF (heart failure with reduced ejection fraction) ([place])    Hypertension    Polysubstance abuse ([place])    UDS positive for cocaine, amphetamines, benzos 05/15/20   ST elevation myocardial infarction (STEMI) of inferior wall ([place])    Stroke ([place]) 10/03/2020    Patient Active Problem List   Diagnosis Date Noted   Sternal pain 01/15/2021   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   Lactic acidosis 10/04/2020   Dehydration 10/04/2020   Chronic combined systolic and diastolic CHF (congestive heart failure) ([place]) 10/04/2020   Acute encephalopathy 10/03/2020   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   CAD (coronary artery disease) 05/17/2020   HFrEF (heart failure with reduced ejection fraction) (HCC)    CAD, 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   Polysubstance abuse ([place]) 05/16/2020   Cardiomyopathy, ischemic 05/16/2020   Hyperlipidemia LDL goal <70 05/16/2020   Essential hypertension 05/16/2020   Elevated hemoglobin A1c 05/16/2020   Acute ST elevation myocardial infarction (STEMI) of inferior  wall ([place]) 05/14/2020   Tobacco abuse 05/14/2020   Leukocytosis 05/14/2020   Anxiety 05/14/2020   Cough 05/14/2020     Physical Exam  Triage Vital Signs: ED Triage Vitals  Enc Vitals Group     BP 03/31/22 2012 (!) 147/114     Pulse Rate 03/31/22 2012 98     Resp 03/31/22 2012 16     Temp 03/31/22 2012 98.3 F (36.8 C)     Temp Source 03/31/22 2012 Oral     SpO2 03/31/22 2012 95 %     Weight 03/31/22 1954 200 lb (90.7 kg)     Height 03/31/22 1954 5\' 6"  (1.676 m)     Head Circumference --      Peak Flow --      Pain Score 03/31/22 1954 8     Pain Loc --      Pain Edu? --      Excl. in [place]? --     Most recent vital signs: Vitals:   03/31/22 2012  BP: (!) 147/114  Pulse: 98  Resp: 16  Temp: 98.3 F (36.8 C)  SpO2: 95%     General: Awake, no distress.  CV:  Good peripheral perfusion.  Resp:  Normal effort.  Abd:  No distention.  Neuro:             Awake, Alert, Oriented x 3  Other:  Patient is  hyperactive, has difficulty sitting still, repetitively scratching at his face   ED Results / Procedures / Treatments  Labs (all labs ordered are listed, but only abnormal results are displayed) Labs Reviewed  COMPREHENSIVE METABOLIC PANEL - Abnormal; Notable for the following components:      Result Value   Potassium 3.4 (*)    CO2 20 (*)    Glucose, Bld 121 (*)    Total Protein 8.2 (*)    AST 50 (*)    All other components within normal limits  SALICYLATE LEVEL - Abnormal; Notable for the following components:   Salicylate Lvl <7.0 (*)    All other components within normal limits  ACETAMINOPHEN LEVEL - Abnormal; Notable for the following components:   Acetaminophen (Tylenol), Serum <10 (*)    All other components within normal limits  URINE DRUG SCREEN, QUALITATIVE ([facility] ONLY) - Abnormal; Notable for the following components:   Cocaine Metabolite,[place] POSITIVE (*)    All other components within normal limits  RESP PANEL BY RT-PCR (FLU A&B, COVID) ARPGX2   ETHANOL  CBC  TROPONIN I (HIGH SENSITIVITY)     EKG     RADIOLOGY    PROCEDURES:  Critical Care performed: No  Procedures   MEDICATIONS ORDERED IN ED: Medications - No data to display   IMPRESSION / MDM / ASSESSMENT AND PLAN / ED COURSE  I reviewed the triage vital signs and the nursing notes.                              Patient's presentation is most consistent with acute complicated illness / injury requiring diagnostic workup.  Differential diagnosis includes, but is not limited to, intoxication with sympathomimetic such as meth, alcohol intoxication, psychosis  Patient is a 56 year old male presenting under IVC because of delusions and hallucinations.  Apparently he had seen a rat around his house but no one else could see.  Patient is somewhat disheveled appearing he has multiple areas of skin breakdown on his face that he is repetitively scratching out.  He is pleasant but hyperactive.  Tells me does not get along well with his sister and that there was a mouse in the house today.  Denies SI or HI.  Given concern for patient's safety we will uphold IVC and have psychiatry see.  Do not think he currently requires any sedation at this time but will have low threshold to give low-dose benzo if patient becoming more agitated.  The patient has been placed in psychiatric observation due to the need to provide a safe environment for the patient while obtaining psychiatric consultation and evaluation, as well as ongoing medical and medication management to treat the patient's condition.  The patient has been placed under full IVC at this time.        FINAL CLINICAL IMPRESSION(S) / ED DIAGNOSES   Final diagnoses:  Hallucinations     Rx / DC Orders   ED Discharge Orders     None        Note:  This document was prepared using Dragon voice recognition software and may include unintentional dictation errors.   Georga Hacking, MD 03/31/22 [phone]

## 2022-04-01 DIAGNOSIS — F1414 Cocaine abuse with cocaine-induced mood disorder: Secondary | ICD-10-CM

## 2022-04-01 NOTE — Consult Note (Signed)
[facility] Face-to-Face Psychiatry Consult   Reason for Consult:  hallucinations Referring Physician:  EDP Patient Identification: TEON HUDNALL MRN:  [id number] Principal Diagnosis: Cocaine abuse with cocaine-induced mood disorder (HCC) Diagnosis:  Principal Problem:   Cocaine abuse with cocaine-induced mood disorder (HCC)   Total Time spent with patient: 30 minutes  Subjective:   JAMAN ARO is a 56 y.o. male patient admitted with hallucinations/delusion.  HPI: Patient presented to the ED under IVC via [facility] last evening.  According to IVC papers, patient was having hallucinations/delusions of seeing rats and chasing it.  On evaluation this morning, patient is calm and cooperative.  He states that he lives with his mother and sister and there is some discord at home.  He believes that his sister might be using cameras in his room to see if he is doing anything he should not be.  He states that it "finally got to me" he said he was tearing his room apart after looking for the cameras but he told people he is seeing things so his sister would not know that he was looking for cameras.  Patient admits to using cocaine, but states that it is "only once in a blue moon."  Discussed substance use and his health.  Patient has marked, he states that he is apparently picking at.  Patient  declines any help with substance abuse,  but says that he may think about it now that he has been approved for Medicare and disability payments. He reports that he is going to find a new living situation.  Patient is speaking in clear, coherent sentences.  Normal speech and cadence.  He denies suicidal or homicidal ideations.  He does not endorse any auditory or visual hallucinations.  Perceptions appeared to be within normal limits at this time.  Patient no longer meets criteria for involuntary commitment.  IVC was released.  She requested discharge home.  Past Psychiatric History: Polysubstance abuse,  anxiety  Risk to Self:   Risk to Others:   Prior Inpatient Therapy:   Prior Outpatient Therapy:    Past Medical History:  Past Medical History:  Diagnosis Date   Anxiety    Asthma    CAD, multiple vessel    CHF (congestive heart failure) (HCC)    Dyslipidemia, goal LDL below 70    Former smoker    Quit 2021   HFrEF (heart failure with reduced ejection fraction) (HCC)    Hypertension    Polysubstance abuse (HCC)    UDS positive for cocaine, amphetamines, benzos 05/15/20   ST elevation myocardial infarction (STEMI) of inferior wall (HCC)    Stroke (HCC) 10/03/2020    Past Surgical History:  Procedure Laterality Date   CARDIAC CATHETERIZATION     CORONARY ANGIOGRAPHY N/A 05/20/2020   Procedure: CORONARY ANGIOGRAPHY;  Surgeon: Kathleene Hazel, MD;  [facility];  Service: Cardiovascular;  Laterality: N/A;   CORONARY ARTERY BYPASS GRAFT N/A 05/21/2020   Procedure: CORONARY ARTERY BYPASS GRAFTING TIMES FOUR USING LEFT INTERNAL MAMMARY ARTERY  AND ENDOSCOPICALLY HARVESTED RIGHT GREATER SAPHANOUS VEIN.;  Surgeon: Corliss Skains, MD;  Location: [facility] OR;  Service: Open Heart Surgery;  Laterality: N/A;  flow track   CORONARY THROMBECTOMY N/A 05/20/2020   Procedure: Coronary Thrombectomy;  Surgeon: Kathleene Hazel, MD;  [facility]: [facility];  Service: Cardiovascular;  Laterality: N/A;   CORONARY/GRAFT ACUTE MI REVASCULARIZATION N/A 05/14/2020   Procedure: Coronary/Graft Acute MI Revascularization;  Surgeon: Lorine Bears  A, MD;  Location: [facility] INVASIVE CV LAB;  Service: Cardiovascular;  Laterality: N/A;   CORONARY/GRAFT ACUTE MI REVASCULARIZATION N/A 05/20/2020   Procedure: Coronary/Graft Acute MI Revascularization;  Surgeon: Kathleene HazelMcAlhany, Christopher D, MD;  [facility];  Service: Cardiovascular;  Laterality: N/A;   ENDOVEIN HARVEST OF GREATER SAPHENOUS VEIN Right 05/21/2020   Procedure: ENDOVEIN HARVEST OF RIGHT GREATER SAPHENOUS  VEIN;  Surgeon: Corliss SkainsLightfoot, Harrell O, MD;  Location: [facility];  Service: Open Heart Surgery;  Laterality: Right;   INTRAVASCULAR PRESSURE WIRE/FFR STUDY N/A 05/16/2020   Procedure: INTRAVASCULAR PRESSURE WIRE/FFR STUDY;  Surgeon: Iran OuchArida, Muhammad A, MD;  Location: [facility] INVASIVE CV LAB;  Service: Cardiovascular;  Laterality: N/A;   LEFT HEART CATH AND CORONARY ANGIOGRAPHY N/A 05/14/2020   Procedure: LEFT HEART CATH AND CORONARY ANGIOGRAPHY;  Surgeon: Iran OuchArida, Muhammad A, MD;  Location: [facility];  Service: Cardiovascular;  Laterality: N/A;   LEFT HEART CATH AND CORONARY ANGIOGRAPHY N/A 05/16/2020   Procedure: LEFT HEART CATH AND CORONARY ANGIOGRAPHY;  Surgeon: Iran OuchArida, Muhammad A, MD;  Location: [facility] INVAS[facility] CV [facility];  Service: Cardiovascular;  Laterality: N/A;   RIB PLATING N/A 01/06/2021   Procedure: STERNAL PLATING;  Surgeon: Corliss SkainsLightfoot, Harrell O, MD;  [facility];  Service: Thoracic;  Laterality: N/A;   SHOULDER SURGERY     TEE WITHOUT CARDIOVERSION N/A 05/21/2020   Procedure: TRANSESOPHAGEAL ECHOCARDIOGRAM (TEE);  Surgeon: Corliss SkainsLightfoot, Harrell O, MD;  [facility];  Service: Open Heart Surgery;  Laterality: N/A;   Family History:  Family History  Problem Relation Age of Onset   Heart attack Father    Family Psychiatric  History: None reported Social History:  Social History   Substance and Sexual Activity  Alcohol Use No     Social History   Substance and Sexual Activity  Drug Use Yes   Types: Cocaine    Social History   Socioeconomic History   Marital status: Married    Spouse name: Not on file   Number of children: Not on file   Years of education: Not on file   Highest education level: Not on file  Occupational History   Not on file  Tobacco Use   Smoking status: Every Day    Packs/day: 0.25    Years: 42.00    Total pack years: 10.50    Types: Cigarettes   Smokeless tobacco: Former    Types: Chew    Quit date: 2000   Tobacco comments:    1-2 cigarettes  "every now and then"  Vaping Use   Vaping Use: Never used  Substance and Sexual Activity   Alcohol use: No   Drug use: Yes    Types: Cocaine   Sexual activity: Not Currently  Other Topics Concern   Not on file  Social History Narrative   Not on file   Social Determinants of Health   Financial Resource Strain: High Risk (12/18/2020)   Overall Financial Resource Strain (CARDIA)    Difficulty of Paying Living Expenses: Hard  Food Insecurity: No Food Insecurity (12/18/2020)   Hunger Vital Sign    Worried About Running Out of Food in the Last Year: Never true    Ran Out of Food in the Last Year: Never true  Transportation Needs: Unmet Transportation Needs (12/18/2020)   PRAPARE - Transportation    Lack of Transportation (Medical): Yes    Lack of Transportation (Non-Medical): Yes  Physical Activity: Not on file  Stress: Stress Concern Present (12/18/2020)   [employer]  [employer] - Occupational Stress Questionnaire    Feeling of Stress : To some extent  Social Connections: Not on file   Additional Social History:    Allergies:   Allergies  Allergen Reactions   Dilaudid [Hydromorphone Hcl] Shortness Of Breath    Labs:  Results for orders placed or performed during the hospital encounter of 03/31/22 (from the past 48 hour(s))  Troponin I (High Sensitivity)     Status: None   Collection Time: 03/31/22  7:57 PM  Result Value Ref Range   Troponin I (High Sensitivity) 17 <18 ng/L    Comment: (NOTE) Elevated high sensitivity troponin I (hsTnI) values and significant  changes across serial measurements may suggest ACS but many other  chronic and acute conditions are known to elevate hsTnI results.  Refer to the "Links" section for chest pain algorithms and additional  guidance. Performed at [facility], [address] [place], [place] [address]   Comprehensive metabolic panel     Status: Abnormal   Collection Time: 03/31/22  7:57 PM  Result Value  Ref Range   Sodium 137 135 - 145 mmol/L   Potassium 3.4 (L) 3.5 - 5.1 mmol/L   Chloride 106 98 - 111 mmol/L   CO2 20 (L) 22 - 32 mmol/L   Glucose, Bld 121 (H) 70 - 99 mg/dL    Comment: Glucose reference range applies only to samples taken after fasting for at least 8 hours.   BUN 12 6 - 20 mg/dL   Creatinine, Ser 0.62 0.61 - 1.24 mg/dL   Calcium 9.3 8.9 - 37.6 mg/dL   Total Protein 8.2 (H) 6.5 - 8.1 g/dL   Albumin 4.1 3.5 - 5.0 g/dL   AST 50 (H) 15 - 41 U/L   ALT 31 0 - 44 U/L   Alkaline Phosphatase 64 38 - 126 U/L   Total Bilirubin 1.2 0.3 - 1.2 mg/dL   GFR, Estimated >28 >31 mL/min    Comment: (NOTE) Calculated using the CKD-EPI Creatinine Equation (2021)    Anion gap 11 5 - 15    Comment: Performed at [facility], [address] [place], [place] [address]  Ethanol     Status: None   Collection Time: 03/31/22  7:57 PM  Result Value Ref Range   Alcohol, Ethyl (B) <10 <10 mg/dL    Comment: (NOTE) Lowest detectable limit for serum alcohol is 10 mg/dL.  For medical purposes only. Performed at [facility], [address] [place], [place] [address]   Salicylate level     Status: Abnormal   Collection Time: 03/31/22  7:57 PM  Result Value Ref Range   Salicylate Lvl <7.0 (L) 7.0 - 30.0 mg/dL    Comment: Performed at [facility], [address] [place], [place] [address]  Acetaminophen level     Status: Abnormal   Collection Time: 03/31/22  7:57 PM  Result Value Ref Range   Acetaminophen (Tylenol), Serum <10 (L) 10 - 30 ug/mL    Comment: (NOTE) Therapeutic concentrations vary significantly. A range of 10-30 ug/mL  may be an effective concentration for many patients. However, some  are best treated at concentrations outside of this range. Acetaminophen concentrations >150 ug/mL at 4 hours after ingestion  and >50 ug/mL at 12 hours after ingestion are often associated with  toxic reactions.  Performed at [facility], [address], [place], [place] [address]   cbc     Status: None  Collection Time: 03/31/22  7:57 PM  Result Value Ref Range   WBC 10.3 4.0 - 10.5 K/uL   RBC 4.41 4.22 - 5.81 MIL/uL   Hemoglobin 14.2 13.0 - 17.0 g/dL   HCT 83.6 62.9 - 47.6 %   MCV 95.5 80.0 - 100.0 fL   MCH 32.2 26.0 - 34.0 pg   MCHC 33.7 30.0 - 36.0 g/dL   RDW 54.6 50.3 - 54.6 %   Platelets 255 150 - 400 K/uL   nRBC 0.0 0.0 - 0.2 %    Comment: Performed at [facility], [address], [place], [place] [address]  Urine Drug Screen, Qualitative     Status: Abnormal   Collection Time: 03/31/22  8:08 PM  Result Value Ref Range   Tricyclic, Ur Screen NONE DETECTED NONE DETECTED   Amphetamines, Ur Screen NONE DETECTED NONE DETECTED   MDMA (Ecstasy)Ur Screen NONE DETECTED NONE DETECTED   Cocaine Metabolite,Ur [place] POSITIVE (A) NONE DETECTED   Opiate, Ur Screen NONE DETECTED NONE DETECTED   Phencyclidine (PCP) Ur S NONE DETECTED NONE DETECTED   Cannabinoid 50 Ng, Ur [place] NONE DETECTED NONE DETECTED   Barbiturates, Ur Screen NONE DETECTED NONE DETECTED   Benzodiazepine, Ur Scrn NONE DETECTED NONE DETECTED   Methadone Scn, Ur NONE DETECTED NONE DETECTED    Comment: (NOTE) Tricyclics + metabolites, urine    Cutoff 1000 ng/mL Amphetamines + metabolites, urine  Cutoff 1000 ng/mL MDMA (Ecstasy), urine              Cutoff 500 ng/mL Cocaine Metabolite, urine          Cutoff 300 ng/mL Opiate + metabolites, urine        Cutoff 300 ng/mL Phencyclidine (PCP), urine         Cutoff 25 ng/mL Cannabinoid, urine                 Cutoff 50 ng/mL Barbiturates + metabolites, urine  Cutoff 200 ng/mL Benzodiazepine, urine              Cutoff 200 ng/mL Methadone, urine                   Cutoff 300 ng/mL  The urine drug screen provides only a preliminary, unconfirmed analytical test result and should not be used for non-medical purposes. Clinical consideration and professional judgment should be applied to any positive drug screen result  due to possible interfering substances. A more specific alternate chemical method must be used in order to obtain a confirmed analytical result. Gas chromatography / mass spectrometry (GC/MS) is the preferred confirm atory method. Performed at [facility], [address] [place], [place] [address]   Resp Panel by RT-PCR (Flu A&B, Covid) Anterior Nasal Swab     Status: None   Collection Time: 03/31/22  9:20 PM   Specimen: Anterior Nasal Swab  Result Value Ref Range   SARS Coronavirus 2 by RT PCR NEGATIVE NEGATIVE    Comment: (NOTE) SARS-CoV-2 target nucleic acids are NOT DETECTED.  The SARS-CoV-2 RNA is generally detectable in upper respiratory specimens during the acute phase of infection. The lowest concentration of SARS-CoV-2 viral copies this assay can detect is 138 copies/mL. A negative result does not preclude SARS-Cov-2 infection and should not be used as the sole basis for treatment or other patient management decisions. A negative result may occur with  improper specimen collection/handling, submission of specimen other than nasopharyngeal swab, presence of viral mutation(s) within the areas targeted  by this assay, and inadequate number of viral copies(<138 copies/mL). A negative result must be combined with clinical observations, patient history, and epidemiological information. The expected result is Negative.  Fact Sheet for Patients:  BloggerCourse.com  Fact Sheet for Healthcare Providers:  SeriousBroker.it  This test is no t yet approved or cleared by the [place] FDA and  has been authorized for detection and/or diagnosis of SARS-CoV-2 by FDA under an Emergency Use Authorization (EUA). This EUA will remain  in effect (meaning this test can be used) for the duration of the COVID-19 declaration under Section 564(b)(1) of the Act, 21 U.S.C.section 360bbb-3(b)(1), unless the authorization is terminated   or revoked sooner.       Influenza A by PCR NEGATIVE NEGATIVE   Influenza B by PCR NEGATIVE NEGATIVE    Comment: (NOTE) The Xpert Xpress SARS-CoV-2/FLU/RSV plus assay is intended as an aid in the diagnosis of influenza from Nasopharyngeal swab specimens and should not be used as a sole basis for treatment. Nasal washings and aspirates are unacceptable for Xpert Xpress SARS-CoV-2/FLU/RSV testing.  Fact Sheet for Patients: BloggerCourse.com  Fact Sheet for Healthcare Providers: SeriousBroker.it  This test is not yet approved or cleared by the [place] FDA and has been authorized for detection and/or diagnosis of SARS-CoV-2 by FDA under an Emergency Use Authorization (EUA). This EUA will remain in effect (meaning this test can be used) for the duration of the COVID-19 declaration under Section 564(b)(1) of the Act, 21 U.S.C. section 360bbb-3(b)(1), unless the authorization is terminated or revoked.  Performed at [facility], [address] [place], [place] [address]   Troponin I (High Sensitivity)     Status: None   Collection Time: 03/31/22  9:56 PM  Result Value Ref Range   Troponin I (High Sensitivity) 15 <18 ng/L    Comment: (NOTE) Elevated high sensitivity troponin I (hsTnI) values and significant  changes across serial measurements may suggest ACS but many other  chronic and acute conditions are known to elevate hsTnI results.  Refer to the "Links" section for chest pain algorithms and additional  guidance. Performed at [facility], [address] [place], [place] [address]     No current facility-administered medications for this encounter.   Current Outpatient Medications  Medication Sig Dispense Refill   aspirin EC 81 MG tablet Take 1 tablet (81 mg total) by once mouth daily. Swallow whole. (Patient not taking: Reported on 03/31/2022) 90 tablet 1   atorvastatin (LIPITOR) 80 MG tablet  Take 1 tablet (80 mg total) by mouth daily. (Patient not taking: Reported on 03/31/2022) 30 tablet 6   carvedilol (COREG) 6.25 MG tablet Take 1 tablet (6.25 mg total) by mouth 2 (two) times daily. (Patient not taking: Reported on 03/31/2022) 60 tablet 6   furosemide (LASIX) 20 MG tablet Take 1 tablet (20 mg total) by mouth once daily. (Patient not taking: Reported on 03/31/2022) 60 tablet 6   losartan (COZAAR) 25 MG tablet Take 1 tablet (25 mg total) by mouth daily. (Patient not taking: Reported on 03/31/2022) 30 tablet 6   oxyCODONE (ROXICODONE) 5 MG immediate release tablet Take 1 tablet (5 mg total) by mouth every 6 (six) hours as needed for severe pain. (Patient not taking: Reported on 03/31/2022) 12 tablet 0   oxyCODONE 10 MG TABS Take 1 tablet (10 mg total) by mouth every 6 (six) hours as needed for severe pain. (Patient not taking: Reported on 03/31/2022) 28 tablet 0    Musculoskeletal: Strength &  Muscle Tone: within normal limits Gait & Station: normal Patient leans: N/A   Psychiatric Specialty Exam:  Presentation  General Appearance: Appropriate for Environment  Eye Contact:Good  Speech:Clear and Coherent  Speech Volume:Normal  Handedness:No data recorded  Mood and Affect  Mood:No data recorded Affect:Appropriate   Thought Process  Thought Processes:Coherent  Descriptions of Associations:Intact  Orientation:Full (Time, Place and Person)  Thought Content:WDL  History of Schizophrenia/Schizoaffective disorder:No data recorded Duration of Psychotic Symptoms:No data recorded Hallucinations:Hallucinations: None  Ideas of Reference:None  Suicidal Thoughts:Suicidal Thoughts: No  Homicidal Thoughts:Homicidal Thoughts: No   Sensorium  Memory:Immediate Good  Judgment:Fair  Insight:Fair   Executive Functions  Concentration:Good  Attention Span:Good  Recall:Good  Fund of Knowledge:Fair  Language:Fair   Psychomotor Activity  Psychomotor  Activity:Psychomotor Activity: Normal   Assets  Assets:Desire for Improvement; Financial Resources/Insurance; Housing; Resilience; Social Support   Sleep  Sleep:Sleep: Good   Physical Exam: Physical Exam Vitals and nursing note reviewed.  HENT:     Head: Normocephalic.     Nose: No congestion or rhinorrhea.  Eyes:     General:        Right eye: No discharge.        Left eye: No discharge.  Cardiovascular:     Rate and Rhythm: Normal rate.  Pulmonary:     Effort: Pulmonary effort is normal.  Musculoskeletal:        General: Normal range of motion.     Cervical back: Normal range of motion.  Skin:    General: Skin is dry.  Neurological:     Mental Status: He is alert and oriented to person, place, and time.  Psychiatric:        Attention and Perception: Attention normal.        Mood and Affect: Mood normal.        Speech: Speech normal.        Behavior: Behavior normal.        Thought Content: Thought content normal. Thought content is not paranoid or delusional. Thought content does not include homicidal or suicidal ideation.        Cognition and Memory: Cognition normal.        Judgment: Judgment normal.    Review of Systems  Constitutional: Negative.   HENT: Negative.    Respiratory: Negative.    Musculoskeletal: Negative.   Skin: Negative.   Neurological: Negative.   Psychiatric/Behavioral:  Positive for substance abuse. Negative for depression, hallucinations, memory loss and suicidal ideas. The patient is not nervous/anxious and does not have insomnia.    Blood pressure (!) 117/104, pulse 88, temperature 98 F (36.7 C), temperature source Oral, resp. rate 16, height 5\' 6"  (1.676 m), weight 90.7 kg, SpO2 95 %. Body mass index is 32.28 kg/m.  Treatment Plan Summary: Plan Patient was under the influence of cocaine when he arrived. This morning, he is calm and cooperative. Denies suicidal or homicidal ideations. No paranoia, no auditory or visual  hallucinations. Patient no longer meets criteria for involuntary commitment and is requesting discharge. He is refusing referral for substance use services at this time. Reviewed with EDP  Disposition: No evidence of imminent risk to self or others at present.   Patient does not meet criteria for psychiatric inpatient admission. Supportive therapy provided about ongoing stressors. Discussed crisis plan, support from social network, calling 911, coming to the Emergency Department, and calling Suicide Hotline.  , NP 04/01/2022 9:47 AM

## 2022-04-01 NOTE — ED Notes (Signed)
E-signature not working at this time. Pt verbalized understanding of D/C instructions, prescriptions and follow up care with no further questions at this time. Pt in NAD and ambulatory at time of D/C.  Belongings and cellphone/cigarettes and lighter all returned to patient at time of discharge.

## 2022-04-01 NOTE — ED Provider Notes (Addendum)
Emergency Medicine Observation Re-evaluation Note  Matthew Tapia is a 56 y.o. male, seen on rounds today.  Pt initially presented to the ED for complaints of Hallucinations and Chest Pain  Currently, the patient is calm, no acute complaints.  Physical Exam  Blood pressure (!) 147/114, pulse 98, temperature 98.3 F (36.8 C), temperature source Oral, resp. rate 16, height 5\' 6"  (1.676 m), weight 90.7 kg, SpO2 95 %. Physical Exam General: NAD Lungs: CTAB Psych: not agitated  ED Course / MDM  EKG:    I have reviewed the labs performed to date as well as medications administered while in observation.  Recent changes in the last 24 hours include no acute events overnight.  Vital signs normal this morning.  No evidence of serotonin syndrome, alcohol withdrawal, or other autonomic excitatory state.  Plan  Current plan is for psychiatry evaluation and disposition.. Patient is under full IVC at this time.   , MD 04/01/22 [phone]   ----------------------------------------- 9:28 AM on 04/01/2022 ----------------------------------------- Seen by psychiatry.  IVC rescinded.  Hallucinations resolved.  UDS positive for cocaine which explains his presenting symptoms.  Medically and psychiatrically stable for discharge.   13/12/2021, MD 04/01/22 [phone]

## 2022-04-01 NOTE — ED Notes (Signed)
IVC PAPERS  RESCINDED PER  LOUISE  NP  INFORMED  ANNIE  RN 

## 2022-04-30 ENCOUNTER — Emergency Department
Admission: EM | Admit: 2022-04-30 | Discharge: 2022-04-30 | Disposition: A | Discharge status: Home / Self Care | Payer: Medicaid Other | Attending: Emergency Medicine | Admitting: Emergency Medicine

## 2022-04-30 ENCOUNTER — Other Ambulatory Visit: Payer: Self-pay

## 2022-04-30 DIAGNOSIS — I110 Hypertensive heart disease with heart failure: Secondary | ICD-10-CM | POA: Diagnosis not present

## 2022-04-30 DIAGNOSIS — I2510 Atherosclerotic heart disease of native coronary artery without angina pectoris: Secondary | ICD-10-CM | POA: Diagnosis not present

## 2022-04-30 DIAGNOSIS — F1910 Other psychoactive substance abuse, uncomplicated: Secondary | ICD-10-CM | POA: Insufficient documentation

## 2022-04-30 DIAGNOSIS — I509 Heart failure, unspecified: Secondary | ICD-10-CM | POA: Diagnosis not present

## 2022-04-30 DIAGNOSIS — J45909 Unspecified asthma, uncomplicated: Secondary | ICD-10-CM | POA: Diagnosis not present

## 2022-04-30 NOTE — ED Provider Triage Note (Signed)
Emergency Medicine Provider Triage Evaluation Note  Matthew Tapia, a 56 y.o. male  was evaluated in triage.  Pt complains of cocaine use disorder.  Presents to the ED with request for rehab from substance abuse.  He reports he last used on Sunday or Monday.  He endorses chronic anterior chest wall pain secondary to a chronic sternal fracture with hardware failure  Review of Systems  Positive: Substance use disorder Negative: FCS  Physical Exam  BP (!) 145/95   Pulse 88   Temp 98.1 F (36.7 C) (Oral)   Resp 20   Ht 5\' 6"  (1.676 m)   Wt 86.2 kg   SpO2 96%   BMI 30.67 kg/m  Gen:   Awake, no distress  NAD Resp:  Normal effort CTA MSK:   Moves extremities without difficulty  Other:    Medical Decision Making  Medically screening exam initiated at 12:54 PM.  Appropriate orders placed.  Matthew Tapia was informed that the remainder of the evaluation will be completed by another provider, this initial triage assessment does not replace that evaluation, and the importance of remaining in the ED until their evaluation is complete.  Patient to the ED for substance use disorder.  He presents with request for drug rehab counseling.   [name], PA-C 04/30/22 1255

## 2022-04-30 NOTE — ED Provider Notes (Signed)
   [facility] Provider Note    Event Date/Time   First MD Initiated Contact with Patient 04/30/22 1344     (approximate)  History   Chief Complaint: Drug Problem (Patient would like drug rehab for cocaine abuse; Last smoked on Sunday or Monday)  HPI  Matthew Tapia is a 56 y.o. male past medical history anxiety, asthma, CAD, CHF, hypertension, polysubstance abuse, presents emergency department hoping for cocaine detox.  According to the patient he has been using cocaine products over the past 1 year intermittently but more frequently recently.  Last use was 2 or 3 days ago.  Patient is hoping to go to a detox for cocaine abuse.  Patient has no medical complaints today.  Denies any alcohol use.  Physical Exam   Triage Vital Signs: ED Triage Vitals  Enc Vitals Group     BP 04/30/22 1216 (!) 145/95     Pulse Rate 04/30/22 1216 88     Resp 04/30/22 1216 20     Temp 04/30/22 1216 98.1 F (36.7 C)     Temp Source 04/30/22 1216 Oral     SpO2 04/30/22 1216 96 %     Weight 04/30/22 1212 190 lb (86.2 kg)     Height 04/30/22 1212 5\' 6"  (1.676 m)     Head Circumference --      Peak Flow --      Pain Score 04/30/22 1212 7     Pain Loc --      Pain Edu? --      Excl. in GC? --     Most recent vital signs: Vitals:   04/30/22 1216  BP: (!) 145/95  Pulse: 88  Resp: 20  Temp: 98.1 F (36.7 C)  SpO2: 96%    General: Awake, no distress.  CV:  Good peripheral perfusion.  Regular rate and rhythm  Resp:  Normal effort.  Equal breath sounds bilaterally.  Abd:  No distention.  Soft, nontender.  No rebound or guarding.   ED Results / Procedures / Treatments   MEDICATIONS ORDERED IN ED: Medications - No data to display   IMPRESSION / MDM / ASSESSMENT AND PLAN / ED COURSE  I reviewed the triage vital signs and the nursing notes.  Patient's presentation is most consistent with acute presentation with potential threat to life or bodily function.  Patient  presents emergency department for cocaine abuse hoping to go to detox.  Overall the patient appears well he has no medical complaints today.  Reassuring vital signs.  Last used cocaine approximately 2 days ago per patient.  I had our TTS nurse come evaluate the patient, I have provided the patient resources for RTS and other detox facilities.  Overall the patient appears well with no medical complaints.  Will discharge with outpatient resources.  FINAL CLINICAL IMPRESSION(S) / ED DIAGNOSES   Substance abuse  Note:  This document was prepared using Dragon voice recognition software and may include unintentional dictation errors.   14/07/23, MD 04/30/22 1426

## 2022-04-30 NOTE — ED Notes (Signed)
Pt requesting assistance with detox. Pt states he has using cocaine for the last year.

## 2022-04-30 NOTE — ED Triage Notes (Signed)
Patient would like drug rehab for cocaine abuse; Last smoked on Sunday or Monday

## 2022-04-30 NOTE — ED Notes (Signed)
Patient discharged to home per MD order. Patient in stable condition, and deemed medically cleared by ED provider for discharge. Discharge instructions reviewed with patient/family using "Teach Back"; verbalized understanding of medication education and administration, and information about follow-up care. Denies further concerns. ° °

## 2022-06-25 ENCOUNTER — Telehealth: Payer: Self-pay | Admitting: Cardiovascular Disease

## 2022-06-25 MED ORDER — LOSARTAN POTASSIUM 25 MG PO TABS: 25.0000 mg | ORAL_TABLET | Freq: Every day | ORAL | 0 refills | Status: DC

## 2022-06-25 MED ORDER — FUROSEMIDE 20 MG PO TABS: 20.0000 mg | ORAL_TABLET | Freq: Every day | ORAL | 0 refills | Status: DC

## 2022-06-25 NOTE — Telephone Encounter (Signed)
*  STAT* If patient is at the pharmacy, call can be transferred to refill team.   1. Which medications need to be refilled? (please list name of each medication and dose if known)   atorvastatin (LIPITOR) 80 MG tablet    carvedilol (COREG) 6.25 MG tablet    furosemide (LASIX) 20 MG tablet  losartan (COZAAR) 25 MG tablet   2. Which pharmacy/location (including street and city if local pharmacy) is medication to be sent to? [facility], Address: [address], [place], [place] [address] Phone: [phone]  3. Do they need a 30 day or 90 day supply? 90 days  Pt made an appt with Cadence on 06/29/22 at 10:05 am

## 2022-06-29 ENCOUNTER — Telehealth: Payer: Self-pay

## 2022-06-29 ENCOUNTER — Encounter: Payer: Self-pay | Admitting: Medical

## 2022-06-29 ENCOUNTER — Ambulatory Visit: Payer: Medicaid Other | Attending: Medical | Admitting: Medical

## 2022-06-29 VITALS — BP 134/86 | HR 77 | Ht 66.0 in | Wt 249.8 lb

## 2022-06-29 DIAGNOSIS — Z951 Presence of aortocoronary bypass graft: Secondary | ICD-10-CM

## 2022-06-29 DIAGNOSIS — E782 Mixed hyperlipidemia: Secondary | ICD-10-CM

## 2022-06-29 DIAGNOSIS — F1490 Cocaine use, unspecified, uncomplicated: Secondary | ICD-10-CM

## 2022-06-29 DIAGNOSIS — I2510 Atherosclerotic heart disease of native coronary artery without angina pectoris: Secondary | ICD-10-CM | POA: Diagnosis not present

## 2022-06-29 DIAGNOSIS — I255 Ischemic cardiomyopathy: Secondary | ICD-10-CM | POA: Diagnosis not present

## 2022-06-29 DIAGNOSIS — I5020 Unspecified systolic (congestive) heart failure: Secondary | ICD-10-CM

## 2022-06-29 DIAGNOSIS — Z720 Tobacco use: Secondary | ICD-10-CM

## 2022-06-29 MED ORDER — FUROSEMIDE 20 MG PO TABS: 20.0000 mg | ORAL_TABLET | Freq: Every day | ORAL | 2 refills | Status: AC

## 2022-06-29 MED ORDER — FENOFIBRATE 160 MG PO TABS: 160.0000 mg | ORAL_TABLET | Freq: Every day | ORAL | 2 refills | Status: DC

## 2022-06-29 MED ORDER — ENTRESTO 24-26 MG PO TABS: 1.0000 | ORAL_TABLET | Freq: Two times a day (BID) | ORAL | 2 refills | Status: AC

## 2022-06-29 MED ORDER — CARVEDILOL 6.25 MG PO TABS: 6.2500 mg | ORAL_TABLET | Freq: Two times a day (BID) | ORAL | 2 refills | Status: AC

## 2022-06-29 MED ORDER — ATORVASTATIN CALCIUM 80 MG PO TABS: 80.0000 mg | ORAL_TABLET | Freq: Every day | ORAL | 2 refills | Status: DC

## 2022-06-29 NOTE — Patient Instructions (Addendum)
Medication Instructions:  START fenofibrate 160 mg by mouth daily. START Entresto 24-26 mg by mouth twice a day. STOP losartan  *If you need a refill on your cardiac medications before your next appointment, please call your pharmacy*  Lab Work: BMP to be drawn in 2 weeks - Please go to the [facility]. You will check in at the front desk to the right as you walk into the atrium. Valet Parking is offered if needed. - No appointment needed. You may go any day between 7 am and 6 pm.  If you have labs (blood work) drawn today and your tests are completely normal, you will receive your results only by: [place] (if you have MyChart) OR A paper copy in the mail If you have any lab test that is abnormal or we need to change your treatment, we will call you to review the results.   Testing/Procedures: No testing ordered  Follow-Up: At [facility], you and your health needs are our priority.  As part of our continuing mission to provide you with exceptional heart care, we have created designated Provider Care Teams.  These Care Teams include your primary Cardiologist (physician) and Advanced Practice Providers (APPs -  Physician Assistants and Nurse Practitioners) who all work together to provide you with the care you need, when you need it.  We recommend signing up for the patient portal called "MyChart".  Sign up information is provided on this After Visit Summary.  MyChart is used to connect with patients for Virtual Visits (Telemedicine).  Patients are able to view lab/test results, encounter notes, upcoming appointments, etc.  Non-urgent messages can be sent to your provider as well.   To learn more about what you can do with MyChart, go to [email or web address].    Your next appointment:   3 month(s)  Provider:   You may see Kathlyn Sacramento, MD or one of the following Advanced Practice Providers on your designated Care Team:   Murray Hodgkins, NP Christell Faith,  PA-C Cadence Kathlen Mody, PA-C Gerrie Nordmann, NP   Other Instructions Mediterranean Diet A Mediterranean diet refers to food and lifestyle choices that are based on the traditions of countries located on the [employer]. It focuses on eating more fruits, vegetables, whole grains, beans, nuts, seeds, and heart-healthy fats, and eating less dairy, meat, eggs, and processed foods with added sugar, salt, and fat. This way of eating has been shown to help prevent certain conditions and improve outcomes for people who have chronic diseases, like kidney disease and heart disease. What are tips for following this plan? Reading food labels Check the serving size of packaged foods. For foods such as rice and pasta, the serving size refers to the amount of cooked product, not dry. Check the total fat in packaged foods. Avoid foods that have saturated fat or trans fats. Check the ingredient list for added sugars, such as corn syrup. Shopping  Buy a variety of foods that offer a balanced diet, including: Fresh fruits and vegetables (produce). Grains, beans, nuts, and seeds. Some of these may be available in unpackaged forms or large amounts (in bulk). Fresh seafood. Poultry and eggs. Low-fat dairy products. Buy whole ingredients instead of prepackaged foods. Buy fresh fruits and vegetables in-season from local farmers markets. Buy plain frozen fruits and vegetables. If you do not have access to quality fresh seafood, buy precooked frozen shrimp or canned fish, such as tuna, salmon, or sardines. Stock your pantry so you always have certain  foods on hand, such as olive oil, canned tuna, canned tomatoes, rice, pasta, and beans. Cooking Cook foods with extra-virgin olive oil instead of using butter or other vegetable oils. Have meat as a side dish, and have vegetables or grains as your main dish. This means having meat in small portions or adding small amounts of meat to foods like pasta or stew. Use  beans or vegetables instead of meat in common dishes like chili or lasagna. Experiment with different cooking methods. Try roasting, broiling, steaming, and sauting vegetables. Add frozen vegetables to soups, stews, pasta, or rice. Add nuts or seeds for added healthy fats and plant protein at each meal. You can add these to yogurt, salads, or vegetable dishes. Marinate fish or vegetables using olive oil, lemon juice, garlic, and fresh herbs. Meal planning Plan to eat one vegetarian meal one day each week. Try to work up to two vegetarian meals, if possible. Eat seafood two or more times a week. Have healthy snacks readily available, such as: Vegetable sticks with hummus. Greek yogurt. Fruit and nut trail mix. Eat balanced meals throughout the week. This includes: Fruit: 2-3 servings a day. Vegetables: 4-5 servings a day. Low-fat dairy: 2 servings a day. Fish, poultry, or lean meat: 1 serving a day. Beans and legumes: 2 or more servings a week. Nuts and seeds: 1-2 servings a day. Whole grains: 6-8 servings a day. Extra-virgin olive oil: 3-4 servings a day. Limit red meat and sweets to only a few servings a month. Lifestyle  Cook and eat meals together with your family, when possible. Drink enough fluid to keep your urine pale yellow. Be physically active every day. This includes: Aerobic exercise like running or swimming. Leisure activities like gardening, walking, or housework. Get 7-8 hours of sleep each night. If recommended by your health care provider, drink red wine in moderation. This means 1 glass a day for nonpregnant women and 2 glasses a day for men. A glass of wine equals 5 oz (150 mL). What foods should I eat? Fruits Apples. Apricots. Avocado. Berries. Bananas. Cherries. Dates. Figs. Grapes. Lemons. Melon. Oranges. Peaches. Plums. Pomegranate. Vegetables Artichokes. Beets. Broccoli. Cabbage. Carrots. Eggplant. Green beans. Chard. Kale. Spinach. Onions. Leeks. Peas.  Squash. Tomatoes. Peppers. Radishes. Grains Whole-grain pasta. Brown rice. Bulgur wheat. Polenta. Couscous. Whole-wheat bread. [place]. Meats and other proteins Beans. Almonds. Sunflower seeds. Pine nuts. Peanuts. [place]. Salmon. Scallops. Shrimp. [place]. Tilapia. Clams. Oysters. Eggs. Poultry without skin. Dairy Low-fat milk. Cheese. Greek yogurt. Fats and oils Extra-virgin olive oil. Avocado oil. Grapeseed oil. Beverages Water. Red wine. Herbal tea. Sweets and desserts Greek yogurt with honey. Baked apples. Poached pears. Trail mix. Seasonings and condiments Basil. Cilantro. Coriander. Cumin. Mint. Parsley. Sage. Rosemary. Tarragon. Garlic. Oregano. Thyme. Pepper. Balsamic vinegar. Tahini. Hummus. Tomato sauce. Olives. Mushrooms. The items listed above may not be a complete list of foods and beverages you can eat. Contact a dietitian for more information. What foods should I limit? This is a list of foods that should be eaten rarely or only on special occasions. Fruits Fruit canned in syrup. Vegetables Deep-fried potatoes (french fries). Grains Prepackaged pasta or rice dishes. Prepackaged cereal with added sugar. Prepackaged snacks with added sugar. Meats and other proteins Beef. Pork. Lamb. Poultry with skin. Hot dogs. Berniece Salines. Dairy Ice cream. Sour cream. Whole milk. Fats and oils Butter. Canola oil. Vegetable oil. Beef fat (tallow). Lard. Beverages Juice. Sugar-sweetened soft drinks. Beer. Liquor and spirits. Sweets and desserts Cookies. Cakes. Pies. Candy. Seasonings and  condiments Mayonnaise. Pre-made sauces and marinades. The items listed above may not be a complete list of foods and beverages you should limit. Contact a dietitian for more information. Summary The Mediterranean diet includes both food and lifestyle choices. Eat a variety of fresh fruits and vegetables, beans, nuts, seeds, and whole grains. Limit the amount of red meat and sweets that you eat. If  recommended by your health care provider, drink red wine in moderation. This means 1 glass a day for nonpregnant women and 2 glasses a day for men. A glass of wine equals 5 oz (150 mL). This information is not intended to replace advice given to you by your health care provider. Make sure you discuss any questions you have with your health care provider. Document Revised: 06/16/2019 Document Reviewed: 04/13/2019 Elsevier Patient Education  [employer].

## 2022-06-29 NOTE — Telephone Encounter (Signed)
Prior Authorization for fenofibrate 160 mg tablets initiated by [email or web address] KEY: [id number] Response: Your information has been sent to [facility]).

## 2022-06-29 NOTE — Progress Notes (Signed)
Cardiology Office Note:    Date:  06/29/2022   ID:  Matthew Tapia, DOB 07-02-1965, MRN [id number]  PCP:  Mechele Claude, FNP  [facility] Cardiologist:  Kathlyn Sacramento, MD  [facility] Electrophysiologist:  None   Referring MD: Mechele Claude, FNP   Chief Complaint: 6 month follow-up  History of Present Illness:    Matthew Tapia is a 57 y.o. male with a hx of CAD status post CABG and stenting, substance abuse including cocaine and amphetamine, anxiety, chronic pain, hypertension, hyperlipidemia, tobacco use, stroke May 2022 who presents for ED follow-up.    In December 2021 patient was admitted with STEMI.  Urgent cath showed multivessel disease and thrombus in the RCA. This was cleared with aspiration thrombectomy.  He was started on Aggrastat and Brilinta.  Echo showed EF 35 to 86%, grade 1 diastolic dysfunction, severe hypokinesis of the left ventricular wall.  He was taken back to the Cath Lab 05/16/2020 which showed restoration of flow to the RCA no further intervention was required.  Patient was eventually transferred to [facility] for CABG and this was scheduled for 1/28. However on 05/20/2020 the patient developed worsening chest pain with ST changes on EKG.  Cath showed occluded occlusion of RCA and he this was treated with PTCA and balloon angioplasty with restoration of flow.  He was taken to the operating room 05/21/2020 and underwent CABG x4 with LIMA to the LAD, SVG to OM1, SVG to OM2, SVG to PDA.   He was hospitalized in May with mental status changes.  MRI showed patchy multifocal acute ischemic infarcts.  He was dehydrated improved with IV fluids.  Urine tox screen was negative. Echo bubble study during that time showed LVEF 40 to 45%.   Went to the ER 11/23/2020 for chest pain.Troponin was negative. Work-up for ACS was negative.   Patient was seen 11/28/2020 by  Dr. Fletcher Anon for ER follow-up.  It was felt his chest pain was musculoskeletal and it was recommended  he see  CT surgeon. Medical management was continued.   Saw Dr. 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 Plan for sternal plating. He wants diabetes to be more under control before surgery, however patient does not have PCP given lack of insurance. Hgb A1C 6.26 Sep 2020.  Follow-up echo 07/2021 showed LVEF 40-45%, global HK, trivial MR.   Seen 12/25/20 in the ED for edema and shortness of breath. CXR showed clear lungs. BNP 236. Spoke with cardiothoracic sugeon who did not recommend intervention. They gave him lasix 20mg  daily. Called in prescription at [facility] and he was unable to pick it up with lack of incurance. Medicare should kick in September. At follow-up the patient was started on lasix.  The patient was last seen July 2023 and was doing reasonably well from a cardiac standpoint. Pan was to undergo sternal non-union surgery.   Today, the patient reports he will get sternal non-union surgery later in February. He has localized tenderness and pain, but is otherwise doing well.  He had quit smoking but started and is smoking 4-5 cigarettes daily. He has been out all his cardiac medications 2 weeks.Patient is starting to do exercise and walking. He is at a drug rehab program for cocaine. He last used cocaine 4 months ago.  Past Medical History:  Diagnosis Date   Anxiety    Asthma  CAD, multiple vessel    CHF (congestive heart failure) (HCC)    Dyslipidemia, goal LDL below 70    Former smoker    Quit 2021   HFrEF (heart failure with reduced ejection fraction) (HCC)    Hypertension    Polysubstance abuse (HCC)    UDS positive for cocaine, amphetamines, benzos 05/15/20   ST elevation myocardial infarction (STEMI) of inferior wall (HCC)    Stroke (HCC) 10/03/2020    Past Surgical History:  Procedure Laterality Date   CARDIAC CATHETERIZATION     CORONARY ANGIOGRAPHY N/A 05/20/2020    Procedure: CORONARY ANGIOGRAPHY;  Surgeon: Kathleene Hazel, MD;  [facility];  Service: Cardiovascular;  Laterality: N/A;   CORONARY ARTERY BYPASS GRAFT N/A 05/21/2020   Procedure: CORONARY ARTERY BYPASS GRAFTING TIMES FOUR USING LEFT INTERNAL MAMMARY ARTERY  AND ENDOSCOPICALLY HARVESTED RIGHT GREATER SAPHANOUS VEIN.;  Surgeon: Corliss Skains, MD;  Location: [facility] OR;  Service: Open Heart Surgery;  Laterality: N/A;  flow track   CORONARY THROMBECTOMY N/A 05/20/2020   Procedure: Coronary Thrombectomy;  Surgeon: Kathleene Hazel, MD;  [facility]: [facility];  Service: Cardiovascular;  Laterality: N/A;   CORONARY/GRAFT ACUTE MI REVASCULARIZATION N/A 05/14/2020   Procedure: Coronary/Graft Acute MI Revascularization;  Surgeon: Iran Ouch, MD;  Location: [facility] INVASIVE CV LAB;  Service: Cardiovascular;  Laterality: N/A;   CORONARY/GRAFT ACUTE MI REVASCULARIZATION N/A 05/20/2020   Procedure: Coronary/Graft Acute MI Revascularization;  Surgeon: Kathleene Hazel, MD;  [facility];  Service: Cardiovascular;  Laterality: N/A;   ENDOVEIN HARVEST OF GREATER SAPHENOUS VEIN Right 05/21/2020   Procedure: ENDOVEIN HARVEST OF RIGHT GREATER SAPHENOUS VEIN;  Surgeon: Corliss Skains, MD;  Location: [facility];  Service: Open Heart Surgery;  Laterality: Right;   INTRAVASCULAR PRESSURE WIRE/FFR STUDY N/A 05/16/2020   Procedure: INTRAVASCULAR PRESSURE WIRE/FFR STUDY;  Surgeon: Iran Ouch, MD;  Location: [facility] INVASIVE CV LAB;  Service: Cardiovascular;  Laterality: N/A;   LEFT HEART CATH AND CORONARY ANGIOGRAPHY N/A 05/14/2020   Procedure: LEFT HEART CATH AND CORONARY ANGIOGRAPHY;  Surgeon: Iran Ouch, MD;  Location: [facility] INVASIVE CV LAB;  Service: Cardiovascular;  Laterality: N/A;   LEFT HEART CATH AND CORONARY ANGIOGRAPHY N/A 05/16/2020   Procedure: LEFT HEART CATH AND CORONARY ANGIOGRAPHY;  Surgeon: Iran Ouch, MD;  Location: [facility]  INVASIVE CV LAB;  Service: Cardiovascular;  Laterality: N/A;   RIB PLATING N/A 01/06/2021   Procedure: STERNAL PLATING;  Surgeon: Corliss Skains, MD;  Location: [facility] OR;  Service: Thoracic;  Laterality: N/A;   SHOULDER SURGERY     TEE WITHOUT CARDIOVERSION N/A 05/21/2020   Procedure: TRANSESOPHAGEAL ECHOCARDIOGRAM (TEE);  Surgeon: Corliss Skains, MD;  Location: [facility] OR;  Service: Open Heart Surgery;  Laterality: N/A;    Current Medications: Current Meds  Medication Sig   aspirin EC 81 MG tablet Take 1 tablet (81 mg total) by once mouth daily. Swallow whole.   atorvastatin (LIPITOR) 80 MG tablet Take 1 tablet (80 mg total) by mouth daily.   carvedilol (COREG) 6.25 MG tablet Take 1 tablet (6.25 mg total) by mouth 2 (two) times daily.   furosemide (LASIX) 20 MG tablet Take 1 tablet (20 mg total) by mouth daily. Emergency Refill only, No additional refill without office visit   losartan (COZAAR) 25 MG tablet Take 1 tablet (25 mg total) by mouth daily. Emergency Refill, No additional refill until office visit     Allergies:   Dilaudid [hydromorphone  hcl]   Social History   Socioeconomic History   Marital status: Married    Spouse name: Not on file   Number of children: Not on file   Years of education: Not on file   Highest education level: Not on file  Occupational History   Not on file  Tobacco Use   Smoking status: Every Day    Packs/day: 0.25    Years: 42.00    Total pack years: 10.50    Types: Cigarettes   Smokeless tobacco: Former    Types: Chew    Quit date: 2000   Tobacco comments:    1-2 cigarettes "every now and then"  Vaping Use   Vaping Use: Never used  Substance and Sexual Activity   Alcohol use: No   Drug use: Yes    Types: Cocaine   Sexual activity: Not Currently  Other Topics Concern   Not on file  Social History Narrative   Not on file   Social Determinants of Health   Financial Resource Strain: High Risk (12/18/2020)   Overall Financial  Resource Strain (CARDIA)    Difficulty of Paying Living Expenses: Hard  Food Insecurity: No Food Insecurity (12/18/2020)   Hunger Vital Sign    Worried About Running Out of Food in the Last Year: Never true    Dasher in the Last Year: Never true  Transportation Needs: Unmet Transportation Needs (12/18/2020)   [place] - Transportation    Lack of Transportation (Medical): Yes    Lack of Transportation (Non-Medical): Yes  Physical Activity: Not on file  Stress: Stress Concern Present (12/18/2020)   [employer] [place]    Feeling of Stress : To some extent  Social Connections: Not on file     Family History: The patient's family history includes Heart attack in his father.  ROS:   Please see the history of present illness.     All other systems reviewed and are negative.  EKGs/Labs/Other Studies Reviewed:    The following studies were reviewed today:  Echo 07/2021 1. Left ventricular ejection fraction, by estimation, is 40 to 45%. The  left ventricle has mild to moderately decreased function. The left  ventricle demonstrates global hypokinesis. The left ventricular internal  cavity size was mildly dilated. Left  ventricular diastolic parameters are indeterminate.   2. Right ventricular systolic function is low normal. The right  ventricular size is normal.   3. Left atrial size was mildly dilated.   4. The mitral valve is normal in structure. Trivial mitral valve  regurgitation.   5. The aortic valve was not well visualized. Aortic valve regurgitation  is not visualized.   Comparison(s): LVEF 40-45%.   Echo complete bubble study 10/04/2020  1. Left ventricular ejection fraction, by estimation, is 40 to 45%. The  left ventricle has normal function. The left ventricle demonstrates  regional wall motion abnormalities (see scoring diagram/findings for  description). Left ventricular diastolic  parameters were  normal.   2. Right ventricular systolic function is normal. The right ventricular  size is normal.   3. The mitral valve is normal in structure. Mild to moderate mitral valve  regurgitation.   4. The aortic valve is normal in structure. Aortic valve regurgitation is  trivial.    Left heart cath 05/16/2021   Prox LAD to Mid LAD lesion is 70% stenosed. Mid LAD to Dist LAD lesion is 40% stenosed. Mid Cx to Dist Cx lesion is 95%  stenosed. 2nd Mrg lesion is 80% stenosed. Prox RCA to Dist RCA lesion is 80% stenosed. 1st Diag lesion is 99% stenosed.   1.  Severe three-vessel coronary artery disease.  Thrombus burden in the right coronary artery improved significantly in the vessel has normal TIMI-3 flow but has diffuse disease from the proximal to the distal segment before the bifurcation.  In addition, the patient seems to have significant proximal to mid LAD disease which was highly significant by fractional flow reserve evaluation with an IFR ratio of 0.76.  There is complex mid left circumflex disease at the bifurcation of OM 2.  There is also subtotal occlusion of first diagonal which seems to be diffusely diseased.   2.  Left ventricular angiography was not performed.  EF was 35 to 40% by echo. 3.  Significant improvement in left ventricular end-diastolic pressure which is currently only mildly elevated at 17 mmHg.   Recommendations: Given diffuse disease in the right coronary artery and the presence of three-vessel coronary artery disease, recommend evaluation for CABG. I am going to discontinue treatment with Brilinta. Given residual thrombus in the right coronary artery, recommend continuing Aggrastat infusion until CABG. Suspect that CABG can be done early next week. I will make arrangements for transfer to [facility].   Coronary intervention 05/20/2020 Mid LAD to Dist LAD lesion is 40% stenosed. 1st Diag lesion is 99% stenosed. Prox LAD to Mid LAD lesion is 70%  stenosed. Mid Cx to Dist Cx lesion is 95% stenosed. 2nd Mrg lesion is 80% stenosed. Prox RCA to Dist RCA lesion is 80% stenosed. Prox RCA lesion is 100% stenosed. Balloon angioplasty was performed using a BALLOON EMERGE MR 2.5X15. Post intervention, there is a 80% residual stenosis.   1. Acute occlusion of the proximal RCA 2. Successful PTCA/ balloon angioplasty of the proximal RCA 3. No change in severe disease in the LAD and circumflex   Recommendations: Will continue ASA, statin and beta blocker. While awaiting bypass, will transition from Aggrastat to Cangrelor drip due to hospital shortage of Aggrastat. CT surgery notified of change in clinical situation. I think he is stable to wait for bypass tomorrow. Will keep NPO for now.   Echo 05/15/2020 1. Left ventricular ejection fraction, by estimation, is 35 to 40%. The  left ventricle has moderately decreased function. The left ventricle  demonstrates regional wall motion abnormalities (see scoring  diagram/findings for description). Left ventricular   diastolic parameters are consistent with Grade I diastolic dysfunction  (impaired relaxation). There is severe hypokinesis of the left  ventricular, entire inferolateral wall and inferior wall. The average left  ventricular global longitudinal strain is  -11.6 %. The global longitudinal strain is abnormal.   2. Right ventricular systolic function is normal. The right ventricular  size is normal.   3. The mitral valve is normal in structure. No evidence of mitral valve  regurgitation.   4. The aortic valve was not well visualized. Aortic valve regurgitation  is not visualized.   5. The inferior vena cava is normal in size with greater than 50%  respiratory variability, suggesting right atrial pressure of 3 mmHg.   EKG:  EKG is ordered today.  The ekg ordered today demonstrates NSR 77bpm, PVC, nonspecific T wave changes  Recent Labs: 03/31/2022: ALT 31; BUN 12; Creatinine, Ser 1.02;  Hemoglobin 14.2; Platelets 255; Potassium 3.4; Sodium 137  Recent Lipid Panel    Component Value Date/Time   CHOL 168 12/27/2020 1531   TRIG 309 (H)  12/27/2020 1531   HDL 37 (L) 12/27/2020 1531   CHOLHDL 4.5 12/27/2020 1531   VLDL 62 (H) 12/27/2020 1531   LDLCALC 69 12/27/2020 1531   LDLDIRECT 139.3 (H) 10/04/2020 1131    Physical Exam:    VS:  BP 134/86 (BP Location: Left Arm, Patient Position: Sitting, Cuff Size: Large)   Pulse 77   Ht 5\' 6"  (1.676 m)   Wt 249 lb 12.8 oz (113.3 kg)   SpO2 97%   BMI 40.32 kg/m     Wt Readings from Last 3 Encounters:  06/29/22 249 lb 12.8 oz (113.3 kg)  04/30/22 190 lb (86.2 kg)  03/31/22 200 lb (90.7 kg)     GEN:  Well nourished, well developed in no acute distress HEENT: Normal NECK: No JVD; No carotid bruits LYMPHATICS: No lymphadenopathy CARDIAC: RRR, no murmurs, rubs, gallops RESPIRATORY:  Clear to auscultation without rales, wheezing or rhonchi  ABDOMEN: Soft, non-tender, non-distended MUSCULOSKELETAL:  No edema; No deformity  SKIN: Warm and dry NEUROLOGIC:  Alert and oriented x 3 PSYCHIATRIC:  Normal affect   ASSESSMENT:    1. CAD, multiple vessel   2. S/P CABG x 4   3. HFrEF (heart failure with reduced ejection fraction) (HCC)   4. Ischemic cardiomyopathy   5. Hyperlipidemia, mixed   6. Tobacco abuse   7. 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 He has tenderness on palpation along the sternum. The patient is doing activity as able. He is requesting a letter with light duty and heart healthy meal recommendations as he is at a rehab center; I will provide this for him. Refill ASA, Crestor and Coreg. No further ischemic work-up at this time.  Chronic systolic CHF ICM LVEF 40-45% The patient is overall euvolemic on exam today. We will refill lasix 20mg  daily. Stop Losartan and start Entrsto 24-26mg   BID. BMET in 2 weeks. Refill Coreg today.   HLD Most recent chol panel showed total chol 148, TG429, HDL 24, Direct LDL 78. Refill Lipitor 80mg  daily. Start fenofibrate 160mg  daily. Can repeat lipid panel at follow-up.  Tobacco use He is down to smoking 4-5 cigarettes daily.   Cocaine use He is currently at a rehab center. Last cocaine use was 4 months ago.  Disposition: Follow up in 3 month(s) with MD/APP    Signed, Jatziry Wechter Feb 28, PA-C  06/29/2022 10:44 AM    [facility]

## 2022-07-02 IMAGING — MR MR MRA NECK WO/W CM
2 series · 44 of 48 positions shown · IV contrast (10ml Gadavist)
Comparison: Brain MRI earlier same day

CLINICAL DATA: Expressive aphasia. Multifocal infarctions left MCA
territory.

EXAM:
MRA NECK WITHOUT AND WITH CONTRAST
MRA HEAD WITHOUT CONTRAST
TECHNIQUE: Multiplanar and multiecho pulse sequences of the neck were obtained
without and with intravenous contrast. Angiographic images of the
neck were obtained using MRA technique without and with intravenous
contrast; Angiographic images of the Circle of Willis were obtained
using MRA technique without intravenous contrast.
CONTRAST:  10mL GADAVIST GADOBUTROL 1 MMOL/ML IV SOLN

[Series 11: angio_fl3d_cor_post_ttc=2.0s · coronal · B · 0.9mm · 0.85mm/px · 25 of 96 slices shown]
[im 1/96]
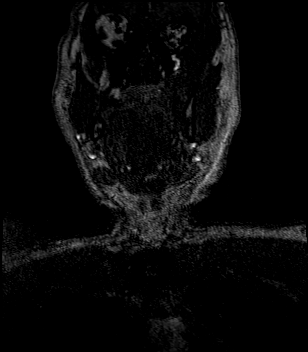
[im 4/96]
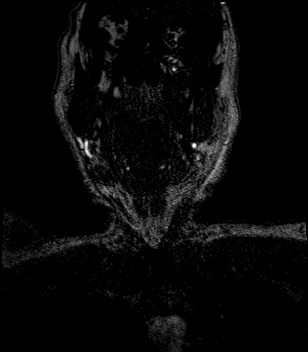
[im 8/96]
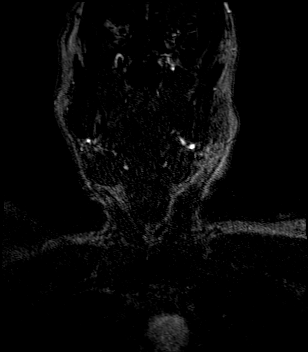
[im 12/96]
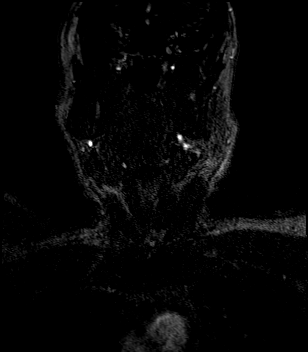
[im 16/96]
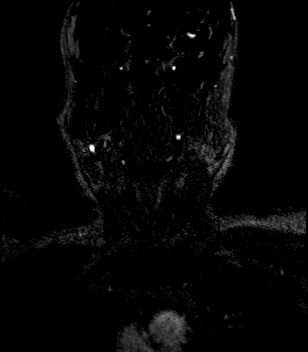
[im 20/96]
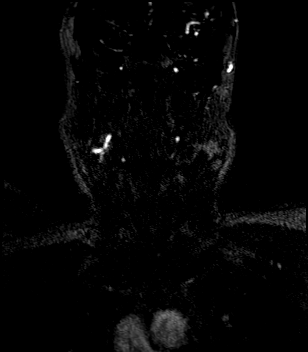
[im 24/96]
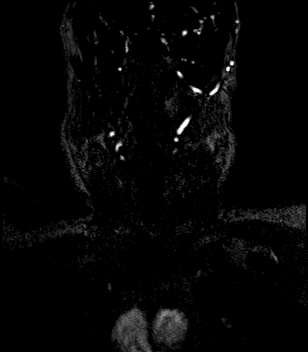
[im 28/96]
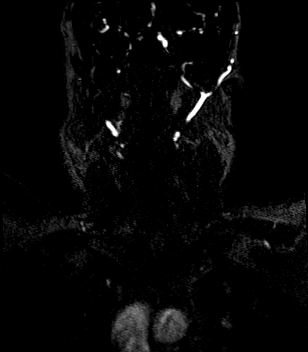
[im 32/96]
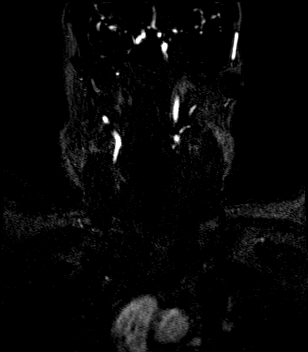
[im 36/96]
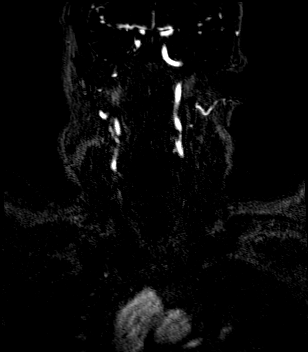
[im 40/96]
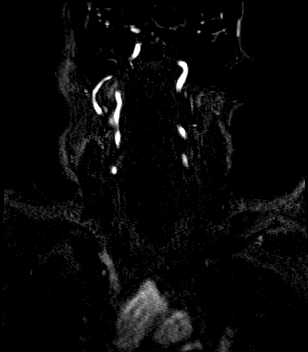
[im 44/96]
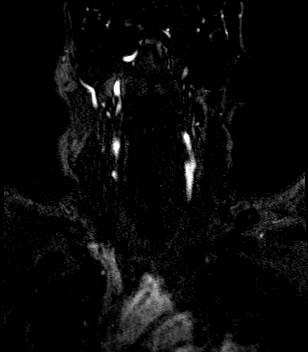
[im 48/96]
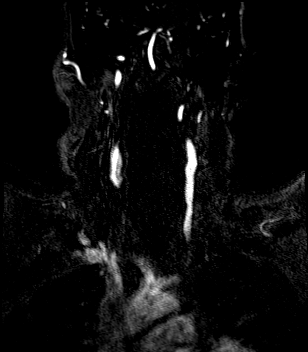
[im 52/96]
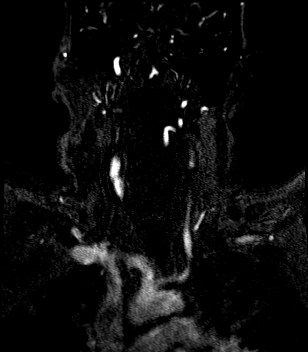
[im 56/96]
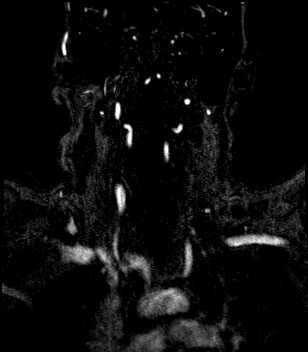
[im 60/96]
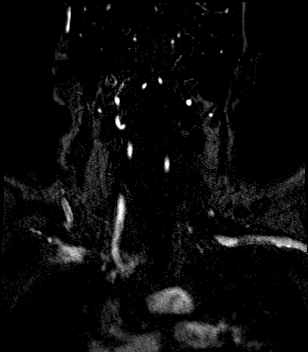
[im 64/96]
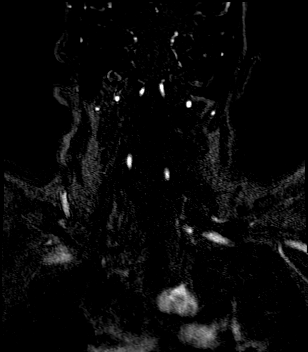
[im 68/96]
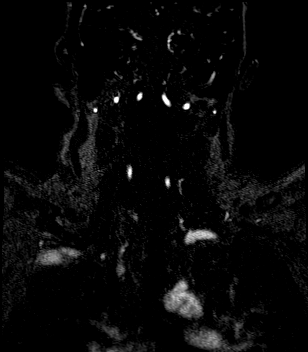
[im 72/96]
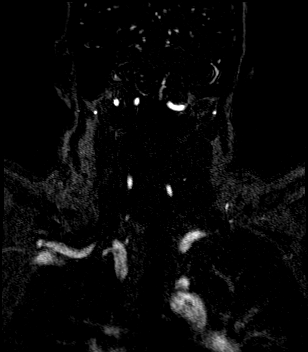
[im 76/96]
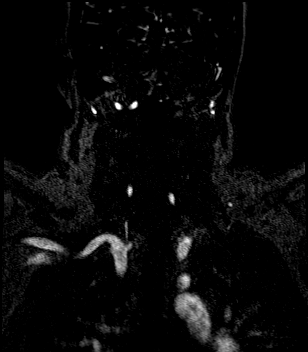
[im 80/96]
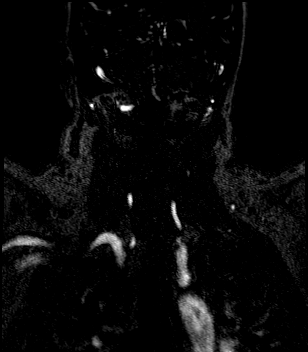
[im 84/96]
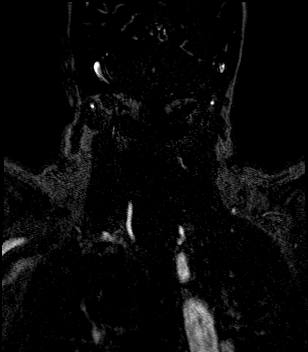
[im 88/96]
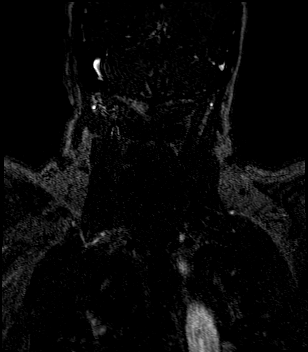
[im 92/96]
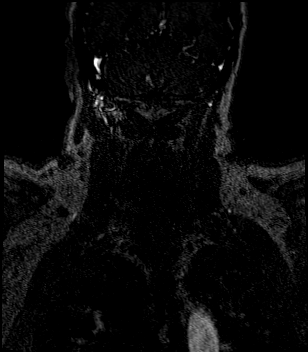
[im 96/96]
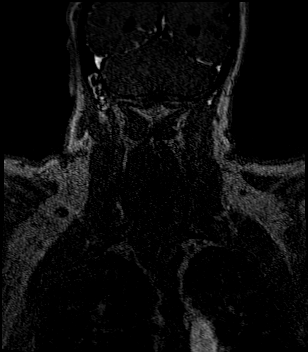

[Series 13: angio_fl3d_cor_post_ttc=2.0s_moco-adv_sub · coronal · B · 0.9mm · 0.85mm/px · 19 of 90 slices shown]
[im 1/90]
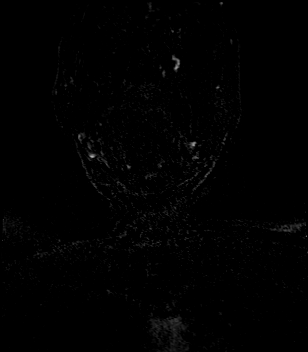
[im 5/90]
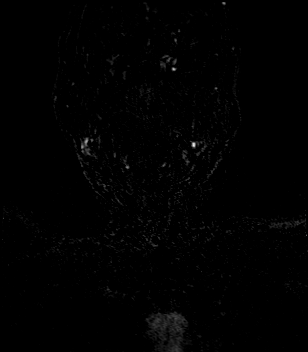
[im 9/90]
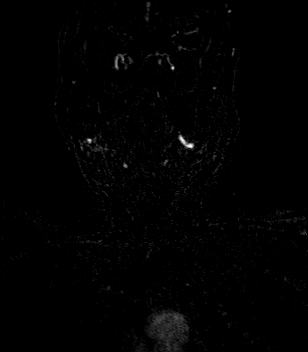
[im 13/90]
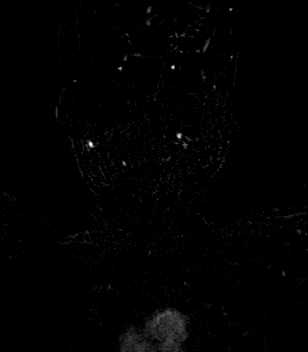
[im 17/90]
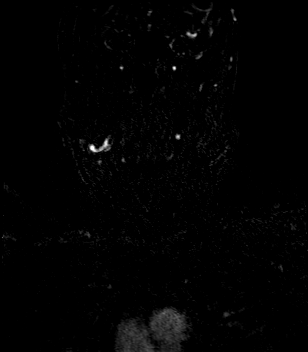
[im 21/90]
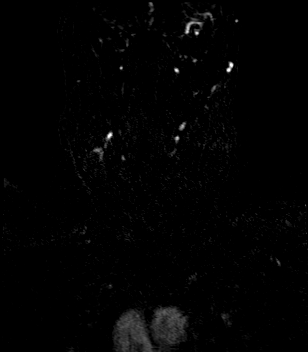
[im 25/90]
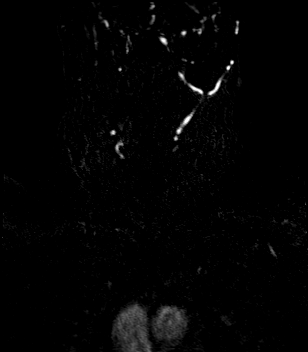
[im 29/90]
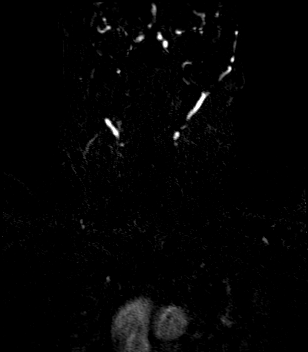
[im 33/90]
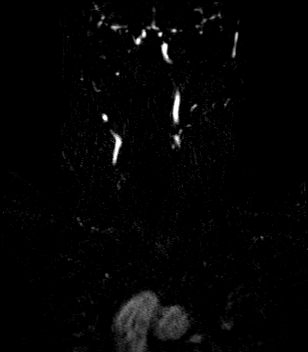
[im 37/90]
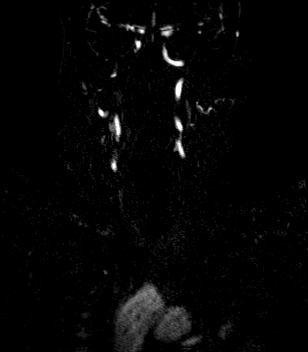
[im 41/90]
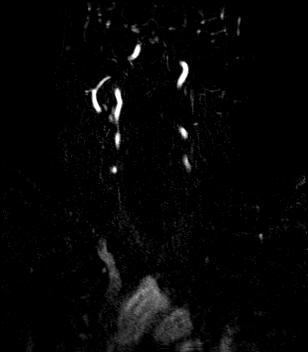
[im 45/90]
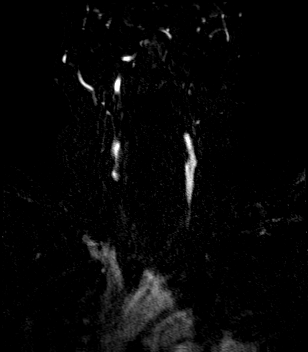
[im 49/90]
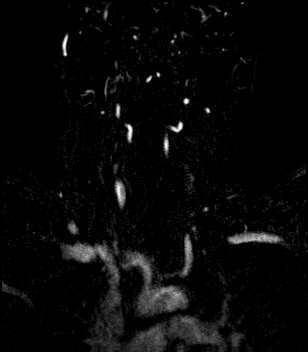
[im 53/90]
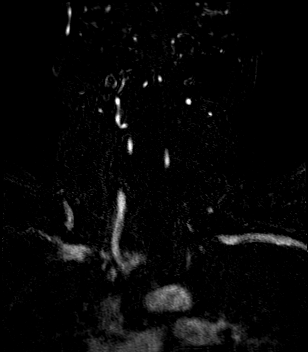
[im 57/90]
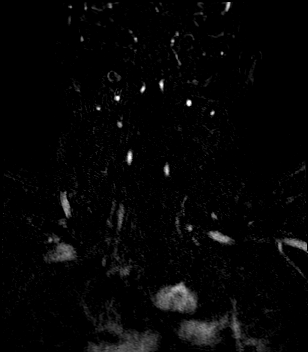
[im 61/90]
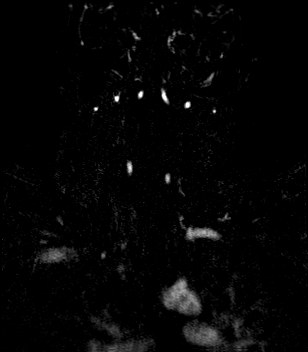
[im 73/90]
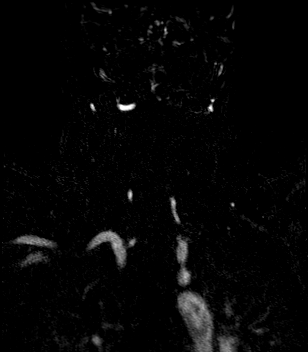
[im 77/90]
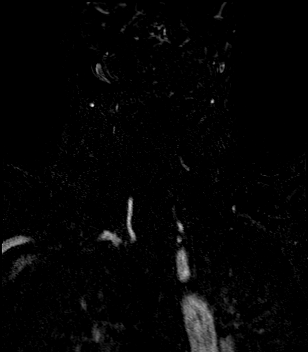
[im 85/90]
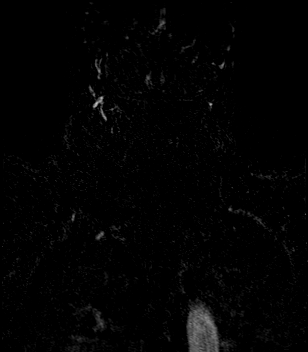

[44 of 48 positions shown; findings below may reference images not displayed]

FINDINGS: MRA NECK FINDINGS

Aortic arch appears normal. Branching pattern is normal without
origin stenosis. Left common carotid artery is widely patent.
Shallow impression upon the medial wall in the midportion. Most
commonly, this may be due to minor atherosclerotic plaque. A local
dissection is not excluded. Consider CT angiography with contrast.
Carotid bifurcation is normal. Cervical internal carotid artery is
normal.

On the right, the common carotid artery appears normal. Mild
narrowing of the proximal ECA. No ICA abnormality. Both vertebral
arteries appear normal.

MRA HEAD FINDINGS

Both internal carotid arteries are widely patent into the brain. No
siphon stenosis. The anterior and middle cerebral vessels are patent
without proximal stenosis, aneurysm or vascular malformation. One
could question narrowing of a left parietal region inferior division

Both vertebral arteries are widely patent to the basilar. No basilar
stenosis. Posterior circulation branch vessels appear normal.
IMPRESSION: Intracranial MR angiography does not show any large vessel
occlusion. One could question some narrowing of a distal left
inferior division M3 branch.

MR angiography of the neck does not show a definite dissection.
There is questionable mild smooth indentation along the medial wall
of the midportion of the common carotid artery. Most often, this
would be due to minor atherosclerotic plaque. Minor dissection of
the common carotid artery can not be excluded using this technique.
I am doubtful that this represents a dissection. If concern
persists, CT angiography could be considered.

## 2022-07-02 NOTE — Telephone Encounter (Signed)
Incoming fax from [employer].  Prior Auth was denied for Fenofibrate.  Per the health plans preferred drug list, at least two preferred drugs must be tried before requesting this drug or tell us why the member cannot try any preferred alternatives.   Preferred alternatives : Gemfibrozil tablet Omega-3 acid ethyl vascepa

## 2022-07-03 ENCOUNTER — Other Ambulatory Visit: Payer: Self-pay | Admitting: Cardiovascular Disease

## 2022-07-06 ENCOUNTER — Telehealth: Payer: Self-pay

## 2022-07-06 ENCOUNTER — Telehealth: Payer: Self-pay | Admitting: Medical

## 2022-07-06 MED ORDER — ICOSAPENT ETHYL 1 G PO CAPS: 2.0000 g | ORAL_CAPSULE | Freq: Two times a day (BID) | ORAL | 0 refills | Status: AC

## 2022-07-06 NOTE — Addendum Note (Signed)
Addended by: Janan Ridge on: 07/06/2022 09:58 AM   Modules accepted: Orders

## 2022-07-06 NOTE — Telephone Encounter (Signed)
Noted. Removing [phone] (M) as his contact number.   I see that our refill team is working on a prior authorization for the patient, but I do not see that any one attempted to call the patient.

## 2022-07-06 NOTE — Telephone Encounter (Signed)
Prior authorization for icosapent ethyl 1 gm capsules initiated by [email or web address]  KEY: [id number]  Response: [facility]) has not yet replied to your PA request. You may close this dialog, return to your dashboard, and perform other tasks.  To check for an update later, open this request again from your dashboard.  If [facility]) has not replied to your request within 24 hours please contact [facility]) at [phone].

## 2022-07-06 NOTE — Telephone Encounter (Signed)
Medication sent - Med list updated.

## 2022-07-06 NOTE — Telephone Encounter (Signed)
Matthew Tapia from the [facility] called stating she received a message about this patient's medication.  She said she let him use her phone because he didn't have one.  Patient has checked himself out of the program.  She doesn't have  any contact information for him.  She just want to make [place] aware.

## 2022-07-06 NOTE — Telephone Encounter (Signed)
Opps that was me. I need to reach him about his PA denial.

## 2022-07-07 NOTE — Telephone Encounter (Signed)
Per fax received from [place], this drug is already covered on the Preferred drug list. It does not require prior approval. The [facility] your pharmacy os using is not covered. The pharmacy can use NDC: [id number]. Please call the pharmacy to process the claim. Will call pharmacy to inform them of this.

## 2022-10-02 ENCOUNTER — Ambulatory Visit: Payer: Medicaid Other | Attending: Medical | Admitting: Medical

## 2022-10-02 NOTE — Progress Notes (Deleted)
Cardiology Office Note:    Date:  10/02/2022   ID:  Matthew Tapia, DOB 26-Sep-1965, MRN [id number]  PCP:  Miki Kins, FNP  [facility] Cardiologist:  Lorine Bears, MD  [facility] Electrophysiologist:  None   Referring MD: Miki Kins, FNP   Chief Complaint: ***  History of Present Illness:    Matthew Tapia is a 57 y.o. male with a hx of CAD status post CABG and stenting, substance abuse including cocaine and amphetamine, anxiety, chronic pain, hypertension, hyperlipidemia, tobacco use, stroke May 2022 who presents for ED follow-up.    In December 2021 patient was admitted with STEMI.  Urgent cath showed multivessel disease and thrombus in the RCA. This was cleared with aspiration thrombectomy.  He was started on Aggrastat and Brilinta.  Echo showed EF 35 to 40%, grade 1 diastolic dysfunction, severe hypokinesis of the left ventricular wall.  He was taken back to the Cath Lab 05/16/2020 which showed restoration of flow to the RCA no further intervention was required.  Patient was eventually transferred to [facility] for CABG and this was scheduled for 1/28. However on 05/20/2020 the patient developed worsening chest pain with ST changes on EKG.  Cath showed occluded occlusion of RCA and he this was treated with PTCA and balloon angioplasty with restoration of flow.  He was taken to the operating room 05/21/2020 and underwent CABG x4 with LIMA to the LAD, SVG to OM1, SVG to OM2, SVG to PDA.   He was hospitalized in May with mental status changes.  MRI showed patchy multifocal acute ischemic infarcts.  He was dehydrated improved with IV fluids.  Urine tox screen was negative. Echo bubble study during that time showed LVEF 40 to 45%.   Went to the ER 11/23/2020 for chest pain.Troponin was negative. Work-up for ACS was negative.   Patient was seen 11/28/2020 by  Dr. Kirke Corin for ER follow-up.  It was felt his chest pain was musculoskeletal and it was recommended  he see CT surgeon.  Medical management was continued.   Saw Dr. 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 Plan for sternal plating. He wants diabetes to be more under control before surgery, however patient does not have PCP given lack of insurance. Hgb A1C 6.26 Sep 2020.   Follow-up echo 07/2021 showed LVEF 40-45%, global HK, trivial MR.   Seen 12/25/20 in the ED for edema and shortness of breath. CXR showed clear lungs. BNP 236. Spoke with cardiothoracic sugeon who did not recommend intervention. They gave him lasix 20mg  daily. Called in prescription at [facility] and he was unable to pick it up with lack of incurance. Medicare should kick in September. At follow-up the patient was started on lasix.  The patient was last seen July 2023 and was doing reasonably well from a cardiac standpoint. Pan was to undergo sternal non-union surgery.   Patient was last seen in February 2024.  He was still smoking.  He had run out of all his cardiac medications 2 weeks prior.  He was at a drug rehab program for cocaine.  All of his cardiac meds were restarted.  Today,   Past Medical History:  Diagnosis Date   Anxiety    Asthma    CAD, multiple vessel    CHF (congestive heart failure) (HCC)    Dyslipidemia, goal LDL below 70    Former smoker  Quit 2021   HFrEF (heart failure with reduced ejection fraction) (HCC)    Hypertension    Polysubstance abuse (HCC)    UDS positive for cocaine, amphetamines, benzos 05/15/20   ST elevation myocardial infarction (STEMI) of inferior wall (HCC)    Stroke (HCC) 10/03/2020    Past Surgical History:  Procedure Laterality Date   CARDIAC CATHETERIZATION     CORONARY ANGIOGRAPHY N/A 05/20/2020   Procedure: CORONARY ANGIOGRAPHY;  Surgeon: Kathleene Hazel, MD;  [facility];  Service: Cardiovascular;  Laterality: N/A;   CORONARY ARTERY BYPASS GRAFT N/A 05/21/2020    Procedure: CORONARY ARTERY BYPASS GRAFTING TIMES FOUR USING LEFT INTERNAL MAMMARY ARTERY  AND ENDOSCOPICALLY HARVESTED RIGHT GREATER SAPHANOUS VEIN.;  Surgeon: Corliss Skains, MD;  Location: [facility] OR;  Service: Open Heart Surgery;  Laterality: N/A;  flow track   CORONARY PRESSURE/FFR STUDY N/A 05/16/2020   Procedure: INTRAVASCULAR PRESSURE WIRE/FFR STUDY;  Surgeon: Iran Ouch, MD;  Location: [facility] CV LAB;  Service: Cardiovascular;  Laterality: N/A;   CORONARY THROMBECTOMY N/A 05/20/2020   Procedure: Coronary Thrombectomy;  Surgeon: Kathleene Hazel, MD;  [facility];  Service: Cardiovascular;  Laterality: N/A;   CORONARY/GRAFT ACUTE MI REVASCULARIZATION N/A 05/14/2020   Procedure: Coronary/Graft Acute MI Revascularization;  Surgeon: Iran Ouch, MD;  Location: [facility];  Service: Cardiovascular;  Laterality: N/A;   CORONARY/GRAFT ACUTE MI REVASCULARIZATION N/A 05/20/2020   Procedure: Coronary/Graft Acute MI Revascularization;  Surgeon: Kathleene Hazel, MD;  [facility];  Service: Cardiovascular;  Laterality: N/A;   ENDOVEIN HARVEST OF GREATER SAPHENOUS VEIN Right 05/21/2020   Procedure: ENDOVEIN HARVEST OF RIGHT GREATER SAPHENOUS VEIN;  Surgeon: Corliss Skains, MD;  [facility];  Service: Open Heart Surgery;  Laterality: Right;   LEFT HEART CATH AND CORONARY ANGIOGRAPHY N/A 05/14/2020   Procedure: LEFT HEART CATH AND CORONARY ANGIOGRAPHY;  Surgeon: Iran Ouch, MD;  Location: [facility];  Service: Cardiovascular;  Laterality: N/A;   LEFT HEART CATH AND CORONARY ANGIOGRAPHY N/A 05/16/2020   Procedure: LEFT HEART CATH AND CORONARY ANGIOGRAPHY;  Surgeon: Iran Ouch, MD;  Location: [facility];  Service: Cardiovascular;  Laterality: N/A;   RIB PLATING N/A 01/06/2021   Procedure: STERNAL PLATING;  Surgeon: Corliss Skains, MD;  [facility];  Service: Thoracic;   Laterality: N/A;   SHOULDER SURGERY     TEE WITHOUT CARDIOVERSION N/A 05/21/2020   Procedure: TRANSESOPHAGEAL ECHOCARDIOGRAM (TEE);  Surgeon: Corliss Skains, MD;  Location[facility];  Service: Open Heart Surgery;  Laterality: N/A;    Current Medications: No outpatient medications have been marked as taking for the 10/02/22 encounter (Appointment) with Fransico Michael, Sueellen Kayes H, PA-C.     Allergies:   Dilaudid [hydromorphone hcl]   Social History   Socioeconomic History   Marital status: Married    Spouse name: Not on file   Number of children: Not on file   Years of education: Not on file   Highest education level: Not on file  Occupational History   Not on file  Tobacco Use   Smoking status: Every Day    Packs/day: 0.25    Years: 42.00    Additional pack years: 0.00    Total pack years: 10.50    Types: Cigarettes   Smokeless tobacco: Former    Types: Chew    Quit date: 2000   Tobacco comments:    1-2 cigarettes "every  now and then"  Vaping Use   Vaping Use: Never used  Substance and Sexual Activity   Alcohol use: No   Drug use: Yes    Types: Cocaine   Sexual activity: Not Currently  Other Topics Concern   Not on file  Social History Narrative   Not on file   Social Determinants of Health   Financial Resource Strain: High Risk (12/18/2020)   Overall Financial Resource Strain (CARDIA)    Difficulty of Paying Living Expenses: Hard  Food Insecurity: No Food Insecurity (12/18/2020)   Hunger Vital Sign    Worried About Running Out of Food in the Last Year: Never true    Ran Out of Food in the Last Year: Never true  Transportation Needs: Unmet Transportation Needs (12/18/2020)   PRAPARE - Transportation    Lack of Transportation (Medical): Yes    Lack of Transportation (Non-Medical): Yes  Physical Activity: Not on file  Stress: Stress Concern Present (12/18/2020)   [employer] of Occupational Health - Occupational Stress Questionnaire    Feeling of Stress : To some  extent  Social Connections: Not on file     Family History: The patient's ***family history includes Heart attack in his father.  ROS:   Please see the history of present illness.    *** All other systems reviewed and are negative.  EKGs/Labs/Other Studies Reviewed:    The following studies were reviewed today: ***  EKG:  EKG is *** ordered today.  The ekg ordered today demonstrates ***  Recent Labs: 03/31/2022: ALT 31; BUN 12; Creatinine, Ser 1.02; Hemoglobin 14.2; Platelets 255; Potassium 3.4; Sodium 137  Recent Lipid Panel    Component Value Date/Time   CHOL 168 12/27/2020 1531   TRIG 309 (H) 12/27/2020 1531   HDL 37 (L) 12/27/2020 1531   CHOLHDL 4.5 12/27/2020 1531   VLDL 62 (H) 12/27/2020 1531   LDLCALC 69 12/27/2020 1531   LDLDIRECT 139.3 (H) 10/04/2020 1131     Risk Assessment/Calculations:   {Does this patient have ATRIAL FIBRILLATION?:[phone]}   Physical Exam:    VS:  There were no vitals taken for this visit.    Wt Readings from Last 3 Encounters:  06/29/22 249 lb 12.8 oz (113.3 kg)  04/30/22 190 lb (86.2 kg)  03/31/22 200 lb (90.7 kg)     GEN: *** Well nourished, well developed in no acute distress HEENT: Normal NECK: No JVD; No carotid bruits LYMPHATICS: No lymphadenopathy CARDIAC: ***RRR, no murmurs, rubs, gallops RESPIRATORY:  Clear to auscultation without rales, wheezing or rhonchi  ABDOMEN: Soft, non-tender, non-distended MUSCULOSKELETAL:  No edema; No deformity  SKIN: Warm and dry NEUROLOGIC:  Alert and oriented x 3 PSYCHIATRIC:  Normal affect   ASSESSMENT:    No diagnosis found. PLAN:    In order of problems listed above:  ***  Disposition: Follow up {follow up:15908} with ***   Shared Decision Making/Informed Consent   {Are you ordering a CV Procedure (e.g. stress test, cath, DCCV, TEE, etc)?   Press F2        :[id number]}    Signed, Tobie Perdue Ardelle Lesches  10/02/2022 8:37 AM    [facility]

## 2022-10-05 ENCOUNTER — Encounter: Payer: Self-pay | Admitting: Medical

## 2022-10-14 ENCOUNTER — Other Ambulatory Visit: Payer: Self-pay

## 2023-07-11 ENCOUNTER — Ambulatory Visit
Admission: EM | Admit: 2023-07-11 | Discharge: 2023-07-11 | Disposition: A | Discharge status: Home / Self Care | Payer: Medicare Other | Attending: Family Medicine | Admitting: Family Medicine

## 2023-07-11 DIAGNOSIS — H6011 Cellulitis of right external ear: Secondary | ICD-10-CM

## 2023-07-11 MED ORDER — DOXYCYCLINE HYCLATE 100 MG PO CAPS: 100.0000 mg | ORAL_CAPSULE | Freq: Two times a day (BID) | ORAL | 0 refills | Status: AC

## 2023-07-11 NOTE — ED Provider Notes (Signed)
[facility]  Note:  This document was prepared using Conservation officer, historic buildings and may include unintentional dictation errors.  MRN: [id number] DOB: 14-Apr-1966  Subjective:   Matthew Tapia is a 58 y.o. male presenting for 1 week history of persistent and worsening right ear pain.  Feels like something bit him to the area.  No drainage of pus or bleeding.  No fever.  No current facility-administered medications for this encounter.  Current Outpatient Medications:    aspirin EC 81 MG tablet, Take 1 tablet (81 mg total) by once mouth daily. Swallow whole., Disp: 90 tablet, Rfl: 1   atorvastatin (LIPITOR) 80 MG tablet, Take 1 tablet (80 mg total) by mouth daily., Disp: 30 tablet, Rfl: 2   carvedilol (COREG) 6.25 MG tablet, Take 1 tablet (6.25 mg total) by mouth 2 (two) times daily., Disp: 60 tablet, Rfl: 2   furosemide (LASIX) 20 MG tablet, Take 1 tablet (20 mg total) by mouth daily., Disp: 30 tablet, Rfl: 2   icosapent Ethyl (VASCEPA) 1 g capsule, Take 2 capsules (2 g total) by mouth 2 (two) times daily., Disp: 360 capsule, Rfl: 0   oxyCODONE (ROXICODONE) 5 MG immediate release tablet, Take 1 tablet (5 mg total) by mouth every 6 (six) hours as needed for severe pain. (Patient not taking: Reported on 03/31/2022), Disp: 12 tablet, Rfl: 0   oxyCODONE 10 MG TABS, Take 1 tablet (10 mg total) by mouth every 6 (six) hours as needed for severe pain. (Patient not taking: Reported on 03/31/2022), Disp: 28 tablet, Rfl: 0   sacubitril-valsartan (ENTRESTO) 24-26 MG, Take 1 tablet by mouth 2 (two) times daily., Disp: 60 tablet, Rfl: 2   Allergies  Allergen Reactions   Dilaudid [Hydromorphone Hcl] Shortness Of Breath    Past Medical History:  Diagnosis Date   Anxiety    Asthma    CAD, multiple vessel    CHF (congestive heart failure) (HCC)    Dyslipidemia, goal LDL below 70    Former smoker    Quit 2021   HFrEF (heart failure with reduced ejection fraction) (HCC)     Hypertension    Polysubstance abuse (HCC)    UDS positive for cocaine, amphetamines, benzos 05/15/20   ST elevation myocardial infarction (STEMI) of inferior wall (HCC)    Stroke (HCC) 10/03/2020     Past Surgical History:  Procedure Laterality Date   CARDIAC CATHETERIZATION     CORONARY ANGIOGRAPHY N/A 05/20/2020   Procedure: CORONARY ANGIOGRAPHY;  Surgeon: Kathleene Hazel, MD;  [facility];  Service: Cardiovascular;  Laterality: N/A;   CORONARY ARTERY BYPASS GRAFT N/A 05/21/2020   Procedure: CORONARY ARTERY BYPASS GRAFTING TIMES FOUR USING LEFT INTERNAL MAMMARY ARTERY  AND ENDOSCOPICALLY HARVESTED RIGHT GREATER SAPHANOUS VEIN.;  Surgeon: Corliss Skains, MD;  [facility];  Service: Open Heart Surgery;  Laterality: N/A;  flow track   CORONARY PRESSURE/FFR STUDY N/A 05/16/2020   Procedure: INTRAVASCULAR PRESSURE WIRE/FFR STUDY;  Surgeon: Iran Ouch, MD;  Location: [facility];  Service: Cardiovascular;  Laterality: N/A;   CORONARY THROMBECTOMY N/A 05/20/2020   Procedure: Coronary Thrombectomy;  Surgeon: Kathleene Hazel, MD;  [facility];  Service: Cardiovascular;  Laterality: N/A;   CORONARY/GRAFT ACUTE MI REVASCULARIZATION N/A 05/14/2020   Procedure: Coronary/Graft Acute MI Revascularization;  Surgeon: Iran Ouch, MD;  Location: [facility];  Service: Cardiovascular;  Laterality: N/A;   CORONARY/GRAFT ACUTE MI REVASCULARIZATION N/A 05/20/2020  Procedure: Coronary/Graft Acute MI Revascularization;  Surgeon: Kathleene Hazel, MD;  [facility];  Service: Cardiovascular;  Laterality: N/A;   ENDOVEIN HARVEST OF GREATER SAPHENOUS VEIN Right 05/21/2020   Procedure: ENDOVEIN HARVEST OF RIGHT GREATER SAPHENOUS VEIN;  Surgeon: Corliss Skains, MD;  [facility];  Service: Open Heart Surgery;  Laterality: Right;   LEFT HEART CATH AND CORONARY ANGIOGRAPHY N/A 05/14/2020    Procedure: LEFT HEART CATH AND CORONARY ANGIOGRAPHY;  Surgeon: Iran Ouch, MD;  Location: [facility];  Service: Cardiovascular;  Laterality: N/A;   LEFT HEART CATH AND CORONARY ANGIOGRAPHY N/A 05/16/2020   Procedure: LEFT HEART CATH AND CORONARY ANGIOGRAPHY;  Surgeon: Iran Ouch, MD;  Location: [facility];  Service: Cardiovascular;  Laterality: N/A;   RIB PLATING N/A 01/06/2021   Procedure: STERNAL PLATING;  Surgeon: Corliss Skains, MD;  [facility];  Service: Thoracic;  Laterality: N/A;   SHOULDER SURGERY     TEE WITHOUT CARDIOVERSION N/A 05/21/2020   Procedure: TRANSESOPHAGEAL ECHOCARDIOGRAM (TEE);  Surgeon: Corliss Skains, MD;  Location: [facility] OR;  Service: Open Heart Surgery;  Laterality: N/A;    Family History  Problem Relation Age of Onset   Heart attack Father     Social History   Tobacco Use   Smoking status: Former    Current packs/day: 0.25    Average packs/day: 0.3 packs/day for 42.0 years (10.5 ttl pk-yrs)    Types: Cigarettes   Smokeless tobacco: Former    Types: Chew    Quit date: 2000   Tobacco comments:    1-2 cigarettes "every now and then"  Vaping Use   Vaping status: Never Used  Substance Use Topics   Alcohol use: No   Drug use: Not Currently    Types: Cocaine    ROS   Objective:   Vitals: BP (!) 152/80 (BP Location: Left Arm)   Pulse 86   Temp 98.8 F (37.1 C) (Oral)   Resp 20   SpO2 94%   Physical Exam Constitutional:      General: He is not in acute distress.    Appearance: Normal appearance. He is well-developed and normal weight. He is not ill-appearing, toxic-appearing or diaphoretic.  HENT:     Head: Normocephalic and atraumatic.     Right Ear: Tympanic membrane, ear canal and external ear normal. There is no impacted cerumen.     Left Ear: Tympanic membrane, ear canal and external ear normal. There is no impacted cerumen.     Ears:      Nose: Nose normal.     Mouth/Throat:     Pharynx:  Oropharynx is clear.  Eyes:     General: No scleral icterus.       Right eye: No discharge.        Left eye: No discharge.     Extraocular Movements: Extraocular movements intact.  Cardiovascular:     Rate and Rhythm: Normal rate.  Pulmonary:     Effort: Pulmonary effort is normal.  Musculoskeletal:     Cervical back: Normal range of motion.  Neurological:     Mental Status: He is alert and oriented to person, place, and time.  Psychiatric:        Mood and Affect: Mood normal.        Behavior: Behavior normal.        Thought Content: Thought content normal.        Judgment: Judgment normal.  Assessment and Plan :   PDMP not reviewed this encounter.  1. Cellulitis of external ear, right    Start doxycycline for the cellulitis.  Use Tylenol for pain relief.  No suspicion for perichondritis, will defer use of fluoroquinolone.  Counseled patient on potential for adverse effects with medications prescribed/recommended today, ER and return-to-clinic precautions discussed, patient verbalized understanding.    Wallis Bamberg, [place] 07/11/23 [phone]

## 2023-07-11 NOTE — ED Triage Notes (Signed)
Pt c/o pain swelling to right ear x week-"feels like something bit me"-NAD-steady gait

## 2023-07-11 NOTE — Discharge Instructions (Addendum)
Start doxycycline for the cellulitis (skin infection of the external ear canal). Use Tylenol for pain relief.

## 2023-11-23 ENCOUNTER — Other Ambulatory Visit: Payer: Self-pay

## 2023-11-23 MED ORDER — ATORVASTATIN CALCIUM 80 MG PO TABS: 80.0000 mg | ORAL_TABLET | Freq: Every day | ORAL | 0 refills | Status: AC
